# Patient Record
Sex: Male | Born: 1950 | Race: White | Hispanic: No | Marital: Married | Smoking: Never smoker
Health system: Southern US, Community
[De-identification: ages and names within clinical notes are randomized; demographics above are authoritative.]

## PROBLEM LIST (undated history)

## (undated) DIAGNOSIS — I10 Essential (primary) hypertension: Secondary | ICD-10-CM

## (undated) DIAGNOSIS — I2510 Atherosclerotic heart disease of native coronary artery without angina pectoris: Secondary | ICD-10-CM

## (undated) DIAGNOSIS — I4891 Unspecified atrial fibrillation: Secondary | ICD-10-CM

## (undated) DIAGNOSIS — E1165 Type 2 diabetes mellitus with hyperglycemia: Secondary | ICD-10-CM

## (undated) DIAGNOSIS — G4733 Obstructive sleep apnea (adult) (pediatric): Secondary | ICD-10-CM

## (undated) DIAGNOSIS — L719 Rosacea, unspecified: Secondary | ICD-10-CM

## (undated) HISTORY — DX: Atherosclerotic heart disease of native coronary artery without angina pectoris: I25.10

## (undated) HISTORY — DX: Essential (primary) hypertension: I10

## (undated) HISTORY — PX: CORONARY ARTERY BYPASS GRAFT: SHX141

## (undated) HISTORY — DX: Obstructive sleep apnea (adult) (pediatric): G47.33

## (undated) HISTORY — DX: Type 2 diabetes mellitus with hyperglycemia: E11.65

## (undated) HISTORY — DX: Rosacea, unspecified: L71.9

---

## 1998-04-10 ENCOUNTER — Inpatient Hospital Stay (HOSPITAL_COMMUNITY): Admission: EM | Admit: 1998-04-10 | Discharge: 1998-04-14 | Payer: Self-pay | Admitting: Cardiology

## 2000-03-20 ENCOUNTER — Inpatient Hospital Stay (HOSPITAL_COMMUNITY): Admission: EM | Admit: 2000-03-20 | Discharge: 2000-03-24 | Payer: Self-pay | Admitting: Cardiology

## 2000-06-06 ENCOUNTER — Inpatient Hospital Stay (HOSPITAL_COMMUNITY): Admission: EM | Admit: 2000-06-06 | Discharge: 2000-06-07 | Payer: Self-pay | Admitting: Cardiology

## 2000-06-06 ENCOUNTER — Encounter: Payer: Self-pay | Admitting: Cardiology

## 2002-04-15 ENCOUNTER — Inpatient Hospital Stay (HOSPITAL_COMMUNITY): Admission: EM | Admit: 2002-04-15 | Discharge: 2002-04-23 | Payer: Self-pay | Admitting: Cardiology

## 2002-04-16 ENCOUNTER — Encounter: Payer: Self-pay | Admitting: *Deleted

## 2002-04-18 ENCOUNTER — Encounter: Payer: Self-pay | Admitting: Thoracic Surgery (Cardiothoracic Vascular Surgery)

## 2002-04-19 ENCOUNTER — Encounter: Payer: Self-pay | Admitting: Thoracic Surgery (Cardiothoracic Vascular Surgery)

## 2002-04-20 ENCOUNTER — Encounter: Payer: Self-pay | Admitting: Thoracic Surgery (Cardiothoracic Vascular Surgery)

## 2002-04-21 ENCOUNTER — Encounter: Payer: Self-pay | Admitting: Thoracic Surgery (Cardiothoracic Vascular Surgery)

## 2006-06-19 ENCOUNTER — Ambulatory Visit: Payer: Self-pay | Admitting: Cardiology

## 2006-06-23 ENCOUNTER — Ambulatory Visit: Payer: Self-pay | Admitting: Cardiology

## 2006-07-12 ENCOUNTER — Ambulatory Visit: Payer: Self-pay | Admitting: Cardiology

## 2006-08-07 ENCOUNTER — Ambulatory Visit: Payer: Self-pay | Admitting: Cardiology

## 2006-09-06 ENCOUNTER — Ambulatory Visit: Payer: Self-pay | Admitting: Cardiology

## 2007-03-19 ENCOUNTER — Ambulatory Visit: Payer: Self-pay | Admitting: Physician Assistant

## 2007-05-29 ENCOUNTER — Ambulatory Visit (HOSPITAL_COMMUNITY): Admission: RE | Admit: 2007-05-29 | Discharge: 2007-05-29 | Payer: Self-pay | Admitting: Orthopedic Surgery

## 2007-06-25 ENCOUNTER — Ambulatory Visit: Payer: Self-pay | Admitting: Cardiology

## 2007-07-26 ENCOUNTER — Ambulatory Visit (HOSPITAL_COMMUNITY): Admission: RE | Admit: 2007-07-26 | Discharge: 2007-07-26 | Payer: Self-pay | Admitting: Orthopedic Surgery

## 2008-01-06 IMAGING — CR DG CHEST 2V
2 series · 2 of 2 positions shown · non-contrast
Comparison: None available.

CHEST - 2 VIEW
CLINICAL DATA: Meniscus tear, preoperative.

[view not recorded (1 of 2)]
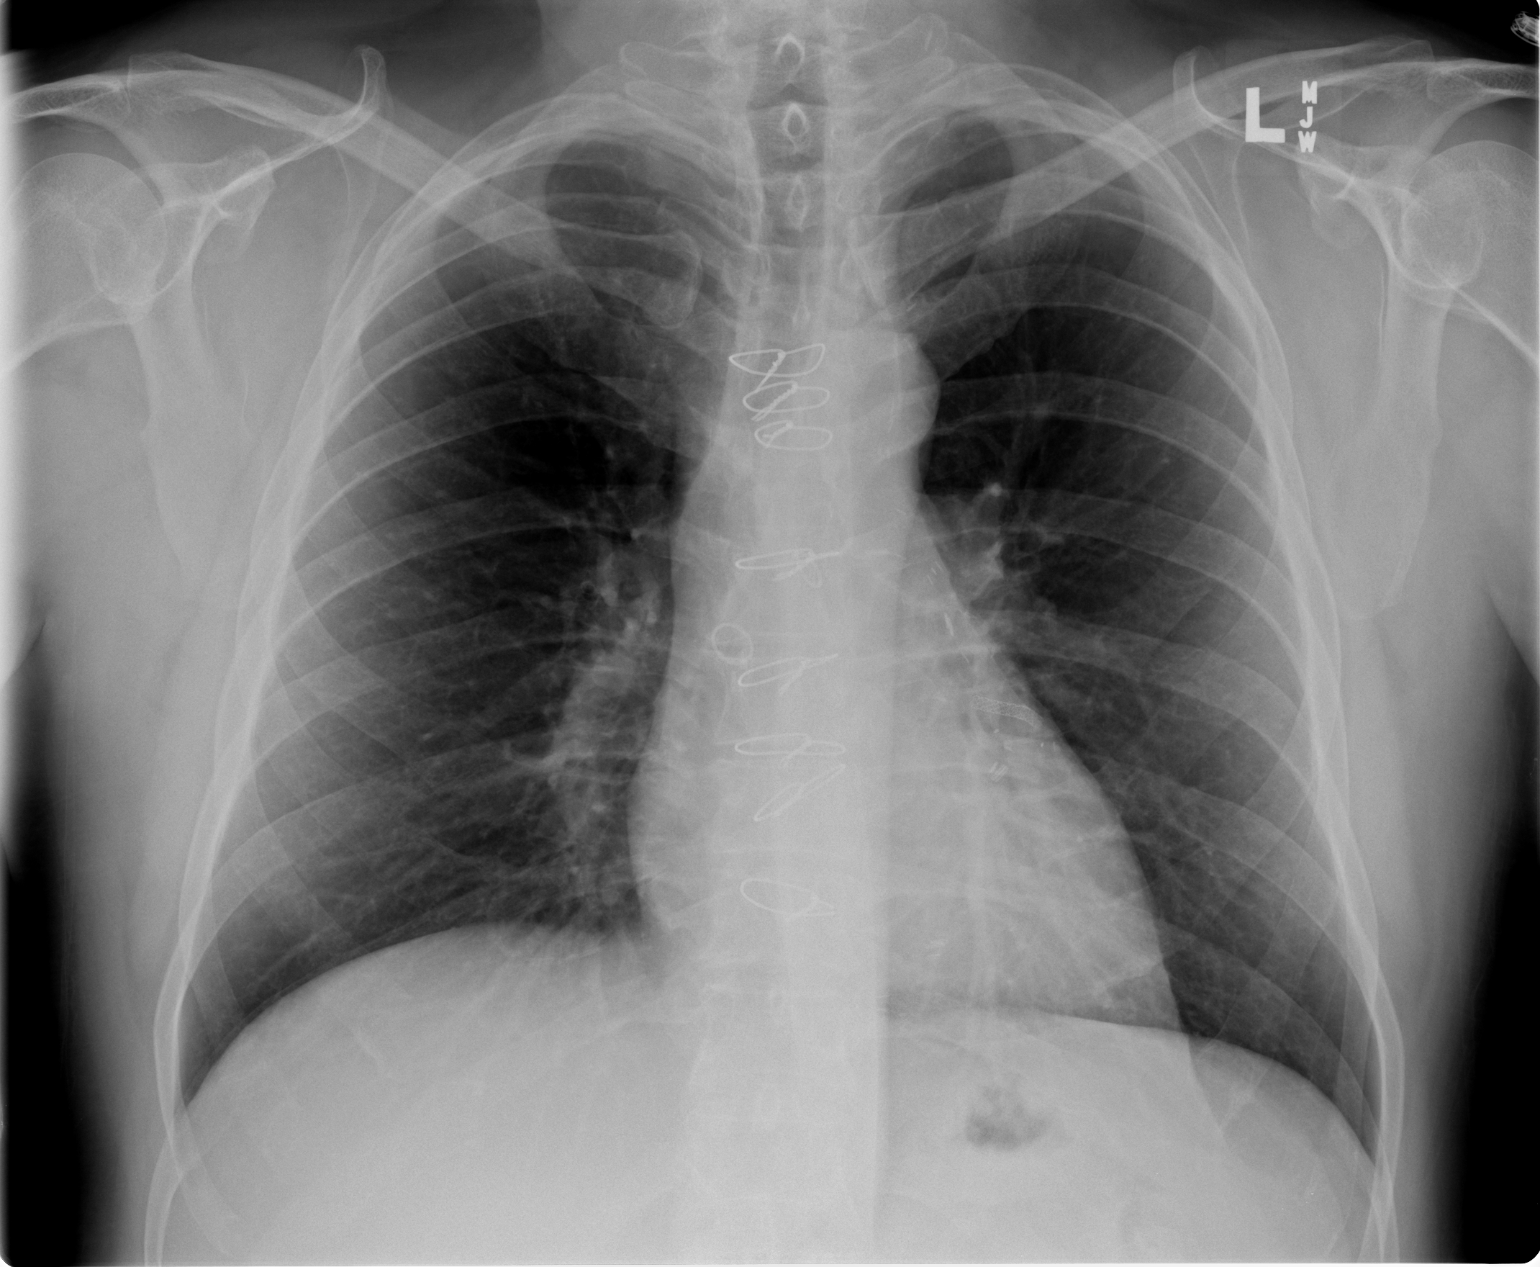

[view not recorded (2 of 2)]
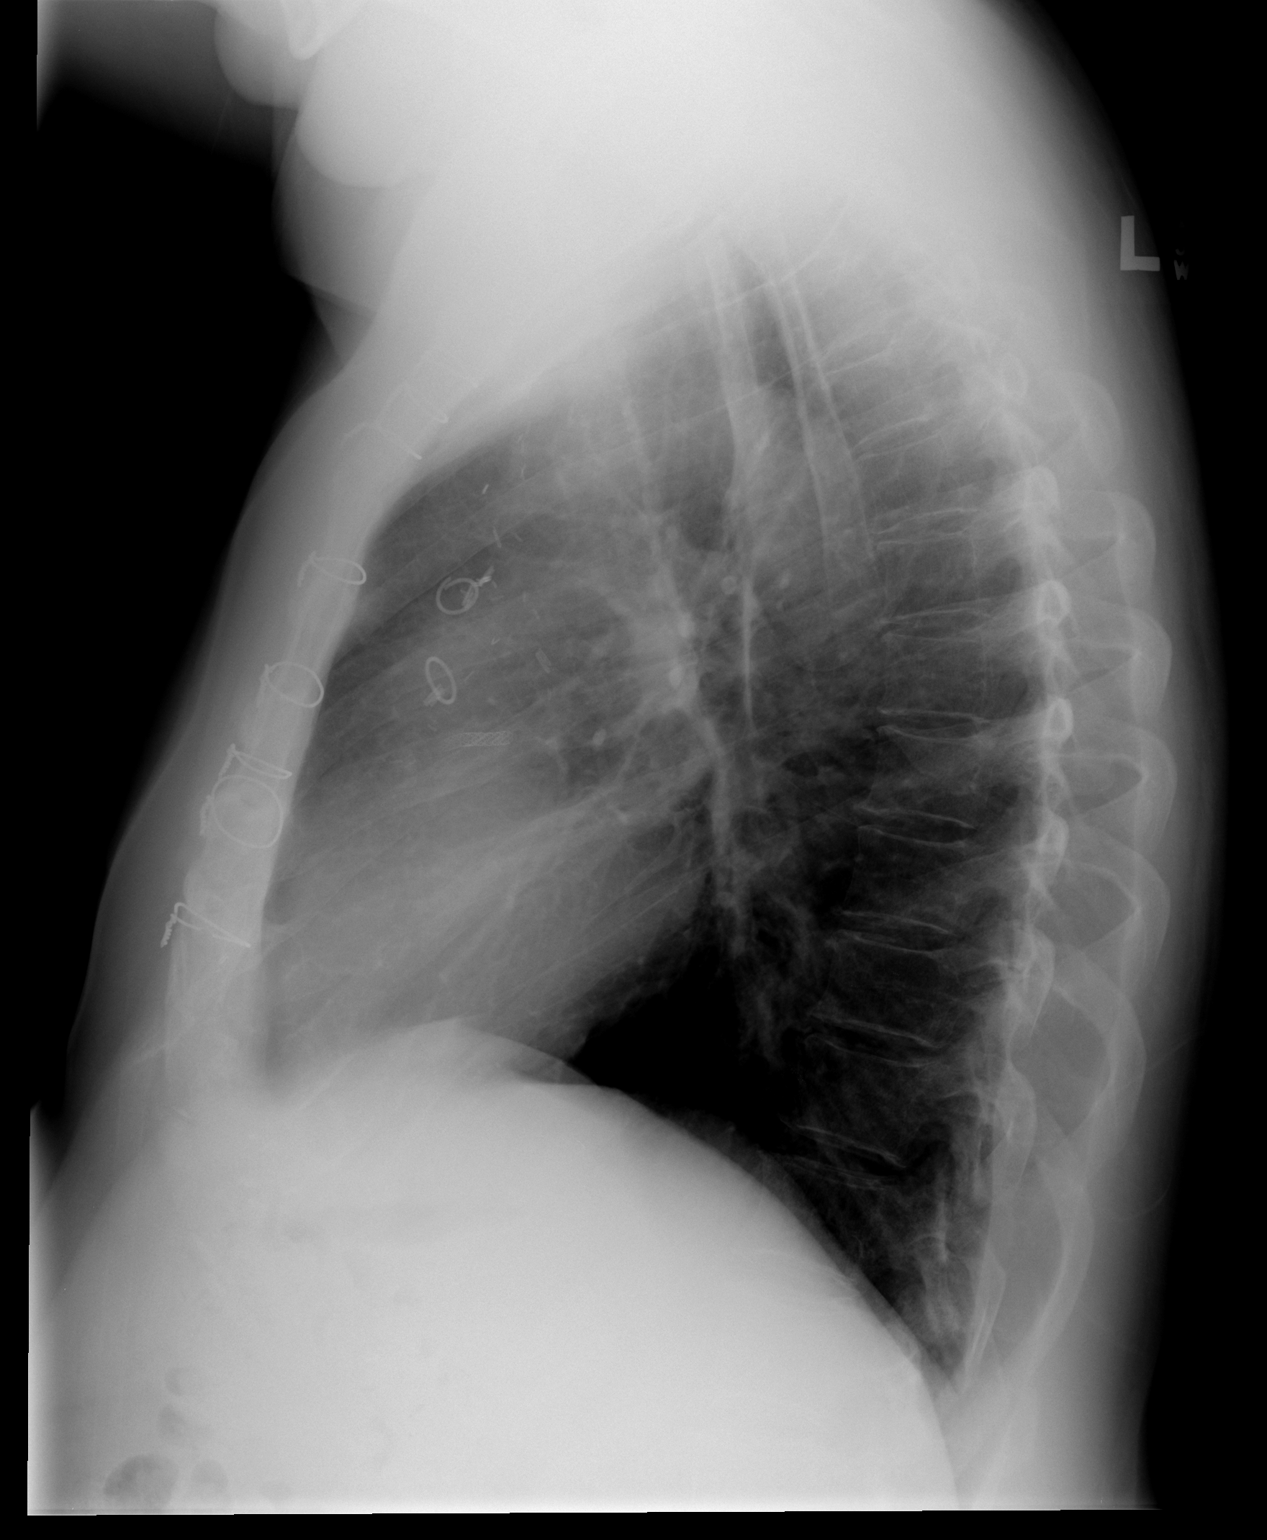

[2 of 2 positions shown; findings below may reference images not displayed]

FINDINGS: Median sternotomy/CABG, and left circumflex artery stent. Lungs
clear. No edema or effusions.

**********************************************************************
IMPRESSION
No active cardiopulmonary disease. 
**********************************************************************

## 2011-01-25 NOTE — Op Note (Signed)
NAMEJARELLE, ATES               ACCOUNT NO.:  [id number]   MEDICAL RECORD NO.:  [id number]          PATIENT TYPE:  AMB   LOCATION:  SDS                          FACILITY:  [facility]   PHYSICIAN:  Vania Rea. Supple, M.D.  DATE OF BIRTH:  [birth year]   DATE OF PROCEDURE:  07/26/2007  DATE OF DISCHARGE:  07/26/2007                               OPERATIVE REPORT   PREOPERATIVE DIAGNOSIS:  Left knee degenerative meniscal tears.   POSTOPERATIVE DIAGNOSES:  1. Left knee lateral meniscus tear.  2. Left knee chondromalacia of the patella.  3. 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   PROCEDURE:  1. Left knee diagnostic arthroscopy.  2. Partial lateral meniscectomy.  3. Chondroplasty of the patella.  4. Chondroplasty of  medial femoral condyle.   SURGEON OF RECORD:  Vania Rea. Supple, M.D.   ASSISTANTFrench Ana Shuford PA-C.   ANESTHESIA:  LMA general as well as local anesthetic.   ESTIMATED BLOOD LOSS:  Minimal.   TOURNIQUET TIME:  None was used.   DRAINS:  None.   HISTORY:  Mr. Husak is a 60 year old gentleman who has had persistent  left knee pain and mechanical symptoms which had been refractory to  attempts at conservative management.  Due to his ongoing pain and  functional limitations he is brought to the operating room at this time  for planned left knee arthroscopy described below.   Preoperatively counseled Mr. Mehlhoff on treatment options as well as  risks versus benefits thereof.  Possible surgical complications include  bleeding, infection, neurovascular injury, DVT, PE as well as persistent  pain were reviewed.  He understands and accepts and agrees with planned  procedure.   PROCEDURE IN DETAIL:  After undergoing routine preop evaluation, the  patient received prophylactic antibiotics.  Placed supine on the  operating table and underwent smooth induction of an LMA general  anesthesia.  Left leg placed in leg holder and sterilely prepped and  draped in  standard fashion.  Standard portals established and diagnostic  arthroscopy performed.  The suprapatellar pouch and gutters were clear.  There were number of small cartilaginous loose bodies that were  evacuated with a shaver.  The patellofemoral joint showed a focal area  of longitudinal chondral fissure along the lateral facet of the patella  and this was debrided to a stable cartilaginous base with a shaver.  There was otherwise normal patellar tracking.  The trochlear groove was  in excellent condition.  The intercondylar notch showed the ACL to be  intact.  Medially the meniscus was carefully inspected and probed and  found to be stable.  There was a focal area of a cartilage defect on the  medial femoral condyle with a nodule of cartilaginous tissue that  appeared to have blistered away from the underlying subchondral bone.  This area was debrided with a shaver and we did find a small full-  thickness cartilage defect approximately 5 x 5 mm.  This area was  aggressively debrided and surrounding rim of cartilage was smoothed with  a shaver.  In the lateral compartment we found a  small degenerative tear  of lateral meniscus midthird and this was trimmed back to a stable  margin with a shaver.  Articular surfaces were in otherwise good  condition in the lateral compartment.  This point final inspection and  irrigation was completed.  We did find several small chondral pieces  beneath the lateral meniscus in the posterolateral compartment.  These  were all evacuated.  We did perform a synovectomy.  In the anterior  chamber we used the Arthrex electrocoagulator to obtain meticulous  hemostasis.  At this point all fluid and instruments were removed.  A  combination of Marcaine, morphine, epinephrine and clonidine was  instilled into the joint and additional Marcaine with epi instilled  about the portals.  The portals closed with Steri-Strips.  Bulky dry  dressing taped to the left knee,  left leg wrapped with an Ace bandage,  thigh high support stocking.  The patient was then extubated and taken  to recovery room in stable condition.      Vania Rea. Supple, M.D.  Electronically Signed     KMS/MEDQ  D:  07/26/2007  T:  07/27/2007  Job:  [id number]

## 2011-01-25 NOTE — Assessment & Plan Note (Signed)
[facility]                          [facility] OFFICE NOTE   NAME:[name], [name] SR                   MRN:          [id number]  DATE:03/19/2007                            DOB:          October 08, 1950    PRIMARY CARDIOLOGIST:  Dr. Willa Rough.   PRIMARY CARE PHYSICIAN:  Dr. Selinda Flavin.   HISTORY OF PRESENT ILLNESS:  [name] is a 60 year old male patient  followed by Dr. Myrtis Ser with a history of coronary artery disease, status  post anterior wall myocardial infarction, treated with a bare metal  stent to the LAD in July of 2001, with associated ischemic  cardiomyopathy with an ejection fraction of 30% in the past, and  subsequent coronary artery bypass grafting in August of 2003, who  presents to the office today for surgical clearance. He has been seen by  Dr. Myrtis Ser recently for SVT. He had initially been seen by Gene Serpe, PA-  C and Dr. Andee Lineman back in October of 2007 for fatigue. He was diagnosed  later with sleep apnea and is now on CPAP. His fatigue is improving. In  the workup he did have a stress test done that revealed no ischemia, but  he did have rapid SVT while on the treadmill. His echocardiogram  revealed an ejection fraction of 60%. Dr. Myrtis Ser placed him on Coreg and  has up titrated the dose. The patient notes in the office today he has  not had any symptoms of chest pain or shortness of breath. Denies any  palpitations, syncope, or near syncope, orthopnea, or paroxysmal  nocturnal dyspnea, pedal edema. He is somewhat limited by his bilateral  knee pain.   CURRENT MEDICATIONS:  1. Aspirin 325 mg a day.  2. Multivitamin daily.  3. Simvastatin 20 mg at bedtime.  4. Diovan 160 mg a day.  5. Actos 45 mg at bedtime.  6. Metformin ER 500 mg 4 tablets at bedtime.  7. Coreg 12.5 mg b.i.d.  8. Lexapro 20 mg daily.   ALLERGIES:  No known drug allergies.   SOCIAL HISTORY:  The patient denies any tobacco abuse.   REVIEW OF SYSTEMS:   Please see HPI. He has bilateral knee pain with  walking. The rest of the review of systems are negative.   PHYSICAL EXAMINATION:  He is well nourished, well developed, in no  distress.  Blood pressure is 152/80, pulse 80, weight 207 pounds.  HEENT:  Normal.  NECK:  Without JVD.  LYMPH: Without lymphadenopathy.  Carotids without bruits bilaterally.  CARDIAC: Normal S1, S2. Regular rate and rhythm without murmurs.  LUNGS: Clear to auscultation bilaterally without wheezing, rhonchi, or  rales.  ABDOMEN: Soft, nontender with normoactive bowel sounds. No organomegaly.  EXTREMITIES: Without edema. Calves soft, nontender.  SKIN: Warm and dry.  NEUROLOGIC: He is alert and oriented x3. Cranial nerves II-XII grossly  intact.   Electrocardiogram was sinus rhythm with a heart rate of 62, leftward  axis, Q waves in V1-3. No significant change since previous tracing  dated June 19, 2006.   IMPRESSION:  1. Coronary artery disease.      a.  Status post anterior wall myocardial infarction July 2001,       treated with a bare metal stent to the LAD.      b.     Subsequent coronary artery bypass grafting times 16 April 2002 (limited LAD vein graft to the ramus intermedius, vein graft       to the obtuse marginal #1, and vein graft to the posterolateral       branch).  2. Ischemic cardiomyopathy with an ejection fraction of 30% in the      past.      a.     Improved to 60% by recent echocardiogram.  3. Hypertension.  4. Hyperlipidemia.  5. Diabetes mellitus.  6. Previous alcohol abuse.  7. Degenerative joint disease.      a.     Needs bilateral knee arthroscopies.  8. History of supraventricular tachycardiac in the setting of exercise      treadmill test-quiescent.  9. Obstructive sleep apnea on continuous positive airway pressure.   PLAN:  The patient presents to the office today for surgical clearance  for upcoming bilateral knee arthroscopies. He is doing well from a   cardiovascular stand point. He denies any symptoms consistent with his  previous angina. He had a recent functional study that showed no  ischemia. He also had an echocardiogram that showed improved ejection  fraction. He has been treated with beta blockers for his  supraventricular tachycardia that seems to be quiescent. He is on a good  medical regimen including aspirin, ARB, statin, and beta blocker.  According to [facility] and AHA guidelines he requires no further cardiac  workup prior to his non cardiac surgery. He should be acceptable risk.  He should remain on aspirin and beta blocker throughout the  perioperative period. I discussed the above assessment and plan with  today Dr. Diona Browner who agreed. The patient will be set up for follow up  with Dr. Myrtis Ser in the next 3 months.      Thomas Newcomer, PA-C  Electronically Signed      Thomas Sidle, MD  Electronically Signed   SW/MedQ  DD: 03/19/2007  DT: 03/19/2007  Job #: [id number]   cc:   [name]. Supple, M.D.  [name]

## 2011-01-25 NOTE — Assessment & Plan Note (Signed)
[facility]                          [facility] NOTE   NAME:[name], [name]                      MRN:          [id number]  DATE:06/25/2007                            DOB:          [birth year]    PRIMARY CARDIOLOGIST:  Dr. Willa Rough.   REASON FOR VISIT:  Scheduled clinic follow up.   Since last seen here in the clinic in July for preoperative clearance,  the patient has undergone successful right knee arthroscopic surgery, by  Dr. Rennis Chris. He is now awaiting follow up evaluation and then scheduling  for similar surgery for the contralateral knee.   From a cardiac standpoint, the patient continues to report no exertional  chest discomfort. He also has history of SVT, and cites significant  improvement since being placed on Coreg, by Dr. Myrtis Ser.   The patient has no history of tobacco smoking.   CURRENT MEDICATIONS:  1. Full dose aspirin.  2. Diovan 160 daily.  3. Actos 45 daily.  4. Metformin 2000 mg daily.  5. Coreg 12.5 b.i.d.  6. Lexapro 20 daily.   PHYSICAL EXAMINATION:  VITAL SIGNS:  Blood pressure 119/67, pulse 68 and  regular, weight 203.6.  GENERAL:  This is a 60 year old male sitting upright in no distress.  HEENT:  Normocephalic atraumatic.  NECK:  Palpable bilateral carotid pulses without bruits; no JVD.  LUNGS:  Clear to auscultation in all fields.  HEART:  Regular rate and rhythm (S1, S2), positive S4. No significant  murmurs. No rubs.  ABDOMEN:  Benign.  EXTREMITIES:  No pedal edema.  NEUROLOGIC:  No focal deficit.   IMPRESSION:  1. Coronary artery disease.      a.     Five vessel CABG, August 2003.      b.     Moderately severe LVD (EF 30%), preop; 60% by 2D echo,       October 2007.      c.     Status post acute anterior MI/bare metal stenting 100% LAD,       July 2001.      d.     Low risk Adenosine stress Cardiolite, October 2007.  2. History of SVT.      a.     Exercise induced; controlled on Coreg.  3.  Type-2 diabetes mellitus.  4. Dyslipidemia.      a.     Followed by Dr. Dimas Aguas.  5. Hypertension.  6. OSA/on CPAP.   PLAN:  1. Down titrate aspirin initially to 162 mg daily, with goal of 81 mg      daily.  2. Provide prescription for p.r.n. nitroglycerin. The patient is now      five years out since bypass surgery.  3. Aggressive lipid management, per Dr. Dimas Aguas, with LDL goal of 70 or      less.  4. Schedule return clinic follow up with myself and Dr. Myrtis Ser in six      months.      Rozell Searing, PA-C  Electronically Signed      Luis Abed, MD, [facility]  Electronically Signed   GS/MedQ  DD: 06/25/2007  DT: 06/26/2007  Job #: [id number]   cc:   Cathie Hoops. Supple, M.D.

## 2011-01-25 NOTE — Op Note (Signed)
NAMEDELVIN, HEDEEN               ACCOUNT NO.:  [id number]   MEDICAL RECORD NO.:  [id number]          PATIENT TYPE:  AMB   LOCATION:  SDS                          FACILITY:  [facility]   PHYSICIAN:  Vania Rea. Supple, M.D.  DATE OF BIRTH:  09/03/1951   DATE OF PROCEDURE:  05/29/2007  DATE OF DISCHARGE:  05/29/2007                               OPERATIVE REPORT   PREOPERATIVE DIAGNOSIS:  Right knee medial meniscus tear.   POSTOPERATIVE DIAGNOSES:  1. Right knee medial femoral condyle chondromalacia.  2. Right knee patella chondromalacia.  3. Right knee synovitis.   PROCEDURES:  1. Right knee diagnostic arthroscopy.  2. Chondroplasty of medial femoral condyle.  3. Chondroplasty of patella.  4. Extensive synovectomy.   SURGEON OF RECORD:  Vania Rea. Supple, M.D.   ANESTHESIA:  Local with IV sedation.   TOURNIQUET TIME:  None was used.   ESTIMATED BLOOD LOSS:  Minimal.   DRAINS:  None.   HISTORY:  Mr. Turrell is a 59 year old gentleman who has had persistent  right knee pain and mechanical symptoms which has been refractory to  prolonged attempts at conservative management.  Due to his ongoing pain  and function limitations and failure to respond to conservative  management, he is brought to the operating room at this time for a  planned right knee arthroscopy described below.  Preoperatively  counseled Mr. Fellers on treatment options as well as risks versus  benefits to the operation.  Possible surgical complications of bleeding,  infection, neurovascular injury, DVT, PE as well as persistence of pain  were reviewed.  He understands and accepts and agrees with our planned  procedure.   PROCEDURE IN DETAIL:  After undergoing routine preoperative evaluation,  the patient received prophylactic antibiotics.  Knee block anesthetic  was placed in the holding area by the anesthesia department, placed  supine on the operating table with the right leg placed in a leg holder  and  sterilely prepped and draped in the standard fashion.  Standard  portals were established and diagnostic arthroscopy was performed.  The  suprapatellar pouch and gutters showed diffuse synovitis emanating from  the anterior chamber with large fronds extending across the  patellofemoral joint.  An extensive synovectomy was performed.  The  patellofemoral joint showed grade 2 chondromalacia of the central  patellar facet and this area was debrided with a shaver down to a stable  cartilaginous base.  There was normal patellar tracking.  The trochlear  groove was in good condition.  Intercondylar notch at the ACL to be  intact with a negative intraoperative Lachman.  There was broad grade 3  chondromalacia on the medial femoral condyle and these areas were  debrided with the shaver down to a stable cartilaginous base and the  medial compartment of the meniscus was probed and found to be stable and  intact and the tibial plateau and majority of medial femoral condyle  were in relatively good condition with some very minimal softening of  the cartilage.  The area of chondromalacia which was debrided was  approximately 3 x 3 cm over the  most anterior margin of the medial  femoral condyle.  Laterally, the articular surfaces were in good  condition.  In addition, the meniscus was probed and found to be stable.  Some synovitis was noted again in the anterior chamber, which was  excised with a shaver.  At this point, final inspection and irrigation  was then completed.  Fluid and instruments were removed.  A combination  of Marcaine, morphine, epinephrine and clonidine was instilled into the  joint and additional Marcaine with epinephrine instilled about the  portals.  The portals closed with Steri-Strips.  A bulky dry dressing  was then wrapped about the right knee.  Right leg was wrapped with an  Ace bandage to thigh.  The patient was then transferred to the recovery  room in stable  condition.      Vania Rea. Supple, M.D.  Electronically Signed     KMS/MEDQ  D:  05/29/2007  T:  05/29/2007  Job:  [id number]

## 2011-01-28 NOTE — Assessment & Plan Note (Signed)
[facility]                            [facility]ICE NOTE   NAME:[name], [name]                   MRN:          [id number]  DATE:07/12/2006                            DOB:          Jan 25, 1951    [name] is seen for follow up. See the note on June 19, 2006 by Mr.  [name] and Dr. Andee Lineman. At that time plans were made for an echo and a  Cardiolite scan. The 2D echo shows that the patient has had good return of  function. I read the ejection fraction as potentially as high as 60% on that  study.  I did mention that there was a suggestion of hypokinesis of the  distal aspect of the anterior wall in the apex. This did not appear to be  marked by echo.  On the nuclear scan the patient exercised. He did not have  chest pain. He walked for 8 minutes on the Bruce protocol. He did have a  burst of regular supraventricular tachycardia that was narrow complex at a  rate of 190 beats per minute. It lasted for approximately 15 seconds. The  ejection fraction from that study was estimated at 45%.  There was evidence  of an old anterior scar in the distal anterior wall in the apex. The base of  the anterior wall moved but the apex was markedly hypokinetic. There was no  definite ischemia seen.  Also, labs were ordered for the patient, showing  that his hemoglobin was 15.4. BUN was 7 with a creatinine of 0.9. HDL was 45  and LDL 108 with triglycerides 277.   The patient is now back to discuss these results.   He is not having any chest pain. 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 He does admit that at times  he feels rapid heart beats. He has had no syncope or presyncope.   PAST MEDICAL HISTORY:   ALLERGIES:  No known drug allergies.   MEDICATIONS:  Aspirin, multivitamins, simvastatin, Lexapro, Diovan, Actos  and Metformin.   OTHER MEDICAL PROBLEMS:  See the complete list below.   REVIEW OF  SYSTEMS:  Other than the HPI, he feels well and his review of  systems otherwise is negative.   PHYSICAL EXAMINATION:  GENERAL: The patient is oriented to person, time and  place and his affect is normal.  VITAL SIGNS: Blood pressure is 125/80 with a pulse of 68.  LUNGS: Clear. Respiratory effort is not labored.  HEENT: Reveals no xanthelasma.  NECK: There are no carotid bruits. There is no jugular venous distension.  CARDIAC EXAM: Reveals an S1 with an S2. There are no clicks or significant  murmurs.  ABDOMEN: Soft, there is no peripheral edema.   LABS:  Are discussed in the history of present illness.   PROBLEMS INCLUDE:  1. History of some excess alcohol in the past.  2. Diabetes.  3. Hyperlipidemia.  4. Hypertension.  5. Status post CABG in 2003.  6. History of anterior myocardial infarction with ejection fraction felt  to be as low as 30% in the past.  7. Current ejection fraction somewhere between 45 and 60%.  8. Fatigue with exercise. I do not think this is ischemic at this time. He      may be having bursts of supraventricular tachycardia.  9. Documented supraventricular tachycardia during his exercise test.   I have decided to try some medicines that may help slow his heart rate and  prevent his SVT. I considered Cardizem. I have decided to use a beta blocker  and we will slowly titrate up Carvedilol. I will see him back for early  follow up.    ______________________________  Thomas Abed, MD, [facility]    JDK/MedQ  DD: 07/12/2006  DT: 07/12/2006  Job #: [id number]   cc:   [name]

## 2011-01-28 NOTE — Discharge Summary (Signed)
[place]. [facility]  Patient:    [name], [name]                        MRN: [id number] Adm. Date:  [id number] Disc. Date: 06/07/00 Attending:  Ronaldo Miyamoto Dictator:   Delton See, P.A. CC:         Luis Abed, M.D. [facility] - c/o [facility]  [address], [address], [place], [place] [address]  Selinda Flavin, M.D. - [address], [place], [place] [address]   Discharge Summary  HISTORY OF PRESENT ILLNESS:  [name] is a 60 year old male, a patient of Dr. Janett Labella who was admitted to [facility] on June 04, 2000, for an evaluation of chest pain.  The patient has a history of hypertension, elevated cholesterol, positive family history of coronary artery disease, history of elevated blood sugar, question diabetes mellitus,  history of a myocardial infarction in July 2001, with subsequent PTCA and stenting of the LAD.  PAST MEDICAL HISTORY:  Please see above.  The patient had a renal calculi in 1992.  ALLERGIES:  No known drug allergies.  CURRENT MEDICATIONS ON ADMISSION: 1. Plavix 75 mg q.d. 2. Altace 2.5 mg b.i.d. 3. Toprol XL 100 mg q.d. 4. Multivitamin q.d. 5. Coated aspirin q.d. 6. He is participating in two experimental trials, and is on medications    for these trials.  SOCIAL HISTORY:  The patient dips snuff.  He drinks three to four beers daily. He previously drank more prior to his myocardial infarction.  He does not use tobacco.  He denies the use of recreational drugs.  He works at [employer].  He is married.  HOSPITAL COURSE:  As noted, this patient was admitted to [facility] for evaluation of chest pain.  He was transferred to [facility] on June 05, 2000, for further evaluation and cardiac catheterization. Enzymes were found to be negative.  The patient underwent a cardiac catheterization on June 06, 2000, performed by Dr. Noralyn Pick. Nishan.  The left main had a 20% lesion.   The LAD had a 30% in-stent restenosis.  There is a 40% distal lesion, a 30%-40% ramus lesion.  The circumflex had a 50% ostium lesion at OM-I.  The RCA had a 30% mid to distal lesion, 20% PDA.  The left ventricle had anterior apical hypokinesis with an ejection fraction of 45%.  DISPOSITION:  Arrangements were made to discharge the patient on the following day in an improved condition, with continued medical treatment.  He was also started on Wellbutrin during this admission.  LABORATORY DATA:  An INR on June 06, 2000, was 1.0, PTT 33.  A chest x-ray showed no active disease.  There is on electrocardiogram in the chart at this time.  DISCHARGE MEDICATIONS: 1. Wellbutrin 150 mg b.i.d. 2. Coated aspirin 325 mg q.d. 3. Toprol XL 100 mg q.d. 4. Plavix 75 mg q.d. 5. Nitroglycerin p.r.n. chest pain. 6. Altace 2.5 mg b.i.d.  INSTRUCTIONS:  The patient was told to avoid any strenuous activity or driving for at least two days.  He is to be on a low-salt, low-fat diet.  He is told to call the office if he has any increased pain, swelling, or bleeding from his groin.  He was told that he could return to work on Monday, June 12, 2000.  FOLLOWUP:  He is to see Dr. Luis Abed on June 21, 2000, at 2  p.m. in [place], and to see Dr. Dimas Aguas in approximately two weeks.  DISCHARGE DIAGNOSES:  1. History of coronary artery disease with previous myocardial infarction and     percutaneous transluminal coronary angioplasty with stenting of the     left anterior descending coronary artery in July 2001.  Details not     available at this time.  2. Recent admission for chest pain with negative enzymes.  Catheterization     performed on June 06, 2000, revealing an ejection fraction of 45%,     and no significant stenosis at the previous stent site.  Please see     details as noted above.  3. History of hypertension.  4. History of elevated cholesterol levels.  5. Positive family  history of coronary artery disease.  6. Elevated blood glucose, question early diabetes mellitus.  7. Mild to moderate alcohol use.  8. 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  9. Participation in two experimental trials through [employer]. 10. History of renal calculi.DD:  06/07/00 TD:  06/07/00 Job: [id number]

## 2011-01-28 NOTE — Procedures (Signed)
[place]. [facility]  Patient:    [name], [name]                        MRN: [id number] Adm. Date:  [id number] Attending:  Ronaldo Miyamoto CC:         Selinda Flavin, M.D.  Luis Abed, M.D. [facility]   Procedure Report  INDICATIONS:  Recurrent chest pain, status post occluded mid LAD with stent in July 2001.  Left main coronary artery had a 20% discrete stenosis.  CORONARY ARTERIES: 1.The stent in the mid LAD had 30% in-stent restenosis.  The lumen was widely patent with at least a 3 mm caliber vessel.  There was 40% tubular distal disease. 2.  There is a medium sized ramus branch with 50 to 60% discrete disease. 3.  This was unchanged from the study in July 2001.  Circumflex coronary artery was normal.  The first obtuse marginal branch had a 50% ostial lesion. 4.  The right coronary artery had 30% multiple discrete lesions in the mid and distal vessel.  The PDA had 20% multiple discrete lesions.  VENTRICULOGRAPHY:  RAO ventriculography:  RAO ventriculography revealed an anterior apical wall hypokinesis.  There has been some improvement since the cath in July 2001.  Ejection fraction was in the 45 to 50% range.  Left ventricular end diastolic pressure was 12 mmHg.  There is no gradient across the aortic valve, with the aortic pressure being 111/57, and LV pressure being 115/12.  IMPRESSION:  The patient does not have critical coronary disease at this time. The ramus branch is unchanged.  The stent has only a 30% in-stent restenosis.  The films were reviewed with Dr. Gerri Spore, and we felt that medical therapy was warranted.  The patient will be started on Wellbutrin for smoking cessation.  He may also need further counseling in regards to his alcohol intake.  If his leg heals well he will be discharged in the morning. DD:  06/06/00 TD:  06/06/00 Job: [id number]

## 2011-01-28 NOTE — Op Note (Signed)
[name], [name]                           ACCOUNT NO.:  [id number]   MEDICAL RECORD NO.:  [id number]                   PATIENT TYPE:  INP   LOCATION:  2301                                 FACILITY:  [facility]   PHYSICIAN:  Salvatore Decent. Cornelius Moras, M.D.              DATE OF BIRTH:  01/07/1951   DATE OF PROCEDURE:  04/19/2002  DATE OF DISCHARGE:                                 OPERATIVE REPORT   PREOPERATIVE DIAGNOSIS:  Severe three-vessel coronary artery disease with  class IV unstable angina.   POSTOPERATIVE DIAGNOSIS:  Severe three-vessel coronary artery disease with  class IV unstable angina.   PROCEDURE:  Median sternotomy or coronary artery bypass grafting x5 (left  internal mammary artery to the distal left anterior descending coronary  artery, saphenous vein graft to ramus intermediate branch and sequential  saphenous vein graft to first circumflex marginal branch, saphenous vein  graft to posterior descending coronary artery and sequential saphenous vein  graft to right posterolateral branch).   SURGEON:  Salvatore Decent. Cornelius Moras, M.D.   ASSISTANT:  Maple Mirza, P.A.   ANESTHESIA:  General.   BRIEF CLINICAL NOTE:  The patient is a 60 year old white male from [place],  [place], followed by Selinda Flavin, M.D., and Luis Abed, M.D.,  and referred by Veneda Melter, M.D., for management of coronary artery  disease.  The patient has a history of coronary artery disease, status post  acute myocardial infarction in 7/01.  He was treated with percutaneous  coronary intervention and stent placement in the left anterior descending  coronary artery at that time.  He also has a history of hypertension,  hyperlipidemia, and type 2 diabetes mellitus.  He now presents with  recurrent symptoms of class IV unstable angina.  Cardiac catheterization  performed by Dr. Chales Abrahams demonstrates severe three-vessel coronary artery  disease with mild left ventricular dysfunction and overall ejection  fraction  estimated at 40%.  A full consultation note has been dictated previously.   OPERATIVE CONSENT:  The patient and his family have been counseled at length  regarding the indications and potential benefits of coronary artery bypass  grafting.  Alternative treatment strategies have been discussed.  They  understand and accept all associated risks of surgery, including but not  limited to risk of death, stroke, myocardial infarction, bleeding requiring  blood transfusion, arrhythmia, infection, and recurrent coronary artery  disease.  All of their questions have been addressed.   DESCRIPTION OF PROCEDURE:  The patient is brought to the operating room on  the above-mentioned date, and central monitoring is established by the  anesthesia service under the care and direction of Quita Skye. Krista Blue, M.D.  Specifically, a Swan-Ganz catheter is placed through the right internal  jugular approach.  A radial arterial line is placed.  Intravenous  antibiotics are administered.  Following induction with general endotracheal  anesthesia, a Foley catheter is placed.  The patient's  chest, abdomen, both  groins, and both lower extremities are prepared and draped in a sterile  manner.   A median sternotomy incision is performed, and the left internal mammary  artery is dissected from the chest wall and prepared for bypass grafting.  The left internal mammary artery is notably good-quality conduit.  Simultaneously saphenous vein is obtained from the patient's right lower leg  through a longitudinal incision.  The saphenous vein is notably good-quality  conduit.  The patient is heparinized systemically.   The pericardium is opened.  The ascending aorta is normal in appearance.  The ascending aorta and the right atrial appendage are cannulated for  cardiopulmonary bypass.  Adequate heparinization is verified.  Cardiopulmonary bypass is begun and the surface of the heart is inspected.  Distal sites are  selected for coronary bypass grafting.  Of note, there is a  lot of scarring in the distal anterior wall in the apex of the heart,  consistent with old anterior myocardial infarction.  Portions of saphenous  vein and the left internal mammary artery are trimmed to appropriate  lengths.  A temperature probe is placed in the left ventricular septum and a  cardioplegia catheter placed in the ascending aorta.   The patient is cooled to 32 degrees systemic temperature.  The aortic  crossclamp is applied and cardioplegia is delivered in an antegrade fashion  through the aortic root.  Iced saline slush is applied for topical  hypothermia.  The initial cardioplegic arrest and myocardial cooling are  felt to be excellent.  Repeat doses of cardioplegia are administered  intermittently throughout the crossclamp portion of the operation both  through the aortic root and down subsequently-placed vein grafts to maintain  septal temperature below 15 degrees Centigrade.  The following distal  coronary anastomoses are performed:  (1)  The posterior descending coronary  is grafted with a saphenous vein graft in a side-to-side fashion.  This  coronary measures 1.5 mm in diameter at the site of distal bypass and is of  good quality.  There is a large acutely thrombotic plaque located just  proximal to this level.  (2) The posterolateral branch off the distal right  coronary artery is grafted using the sequential saphenous vein graft off of  the vein placed to the posterior descending coronary artery.  This coronary  measures 1.8 mm in diameter and is of good quality.  (3) The ramus  intermediate branch is grafted with a saphenous vein graft in a side-to-side  fashion.  This coronary measures 1.7 mm in diameter and is of good quality.  (4) The first circumflex marginal branch is grafted with a sequential saphenous vein graft using the vein placed at the ramus intermediate branch.  This coronary measures 1.0 mm  in diameter and is of good quality at the site  of distal bypass.  (5) The distal left anterior descending coronary artery  is grafted with the left internal mammary artery in an end-to-side fashion.  This coronary measures 1.7 mm in diameter and is of fair quality.  Both  proximal saphenous vein anastomoses are performed directly to the ascending  aorta prior to removal of the aortic crossclamp.  The septal temperature is  noted to rise rapidly and dramatically upon reperfusion of the left internal  mammary artery.  All air is evacuated from the aortic root, and the patient  is placed in Trendelenburg position.  The aortic crossclamp is removed after  a total crossclamp time of 80 minutes.  The heart begins to beat spontaneously without need for cardioversion.  All  proximal and distal anastomoses are inspected for hemostasis and appropriate  graft orientation.  Epicardial pacing wires are affixed to the right  ventricular outflow tract and to the right atrial appendage.  The patient is  rewarmed to greater than 37 degrees Centigrade temperature.  The patient is  weaned from cardiopulmonary bypass without difficulty.  The patient's rhythm  at separation from bypass is normal sinus rhythm.  No inotropic support is  required.  Total cardiopulmonary bypass time for the operation is 101  minutes.   The venous and arterial cannulae are removed uneventfully.  Protamine is  administered to reverse the anticoagulation.  The mediastinum and the left  chest are irrigated in saline solution containing vancomycin.  Meticulous  surgical hemostasis is ascertained.  The mediastinum and the left chest are  drained with two chest tubes placed through separate stab incisions  inferiorly.  The median sternotomy is closed in a routine fashion.  The  right lower extremity incision is closed in multiple layers in the routine  fashion.  All skin incisions are closed with subcuticular skin closures.   The  patient tolerated the procedure well and is transported to the surgical  intensive care unit in stable condition.  There are no intraoperative  complications.  All sponge, instrument, and needle counts are verified  correct at completion of the operation.  No blood products were  administered.                                               Salvatore Decent. Cornelius Moras, M.D.    CHO/MEDQ  D:  04/19/2002  T:  04/23/2002  Job:  [id number]   cc:   Luis Abed, M.D. [facility]   Veneda Melter, M.D. [facility]   Selinda Flavin, M.D.   [facility], [place], [place]

## 2011-01-28 NOTE — Discharge Summary (Signed)
[place]. [facility]  Patient:    [name], [name]                        MRN: [id number] Adm. Date:  [id number] Disc. Date: 03/24/00 Attending:  Talitha Givens Dictator:   Lavella Hammock, P.A.C. CC:         Dr. Selinda Flavin, [place], [place]             Luis Abed, M.D. [facility]             [facility], [address] [place], [place], [place] [address]                           Discharge Summary  DATE OF BIRTH:  08/21/1951.  PROCEDURES: 1. Cardiac catheterization. 2. Coronary arteriogram. 3. Left ventriculogram. 4. PTCA x 2 and stent x 2 one vessel.  HOSPITAL COURSE:  [name] is a 60 year old male with history of nonobstructive coronary artery disease by catheterization in 1999 and a negative stress Cardiolite in April who presented with prolonged chest pain as well as anterolateral ST elevation and hyperacute P-waves.  He presented to [facility] [place] and was transferred to [facility] for further evaluation and treatment.  He was anticoagulated and started on a nitroglycerin drip and given morphine to get him pain-free.  He was taken emergently to the catheterization lab.  He had a normal left main, an LAD with proximal irregularities but it was essentially totally occluded just beyond the septal perforating vessel.  He had PTCA and stent x 2 to this area which reduced the stenosis to 0 with TIMI-3 flow. There was a fairly large ramus intermedius with 50 to 70% segmental mid plaquing.  The circumflex had a marginal branch with 50 to 60% ostial stenosis but no other disease. The RCA had 20 to 30% distal disease.  Ejection fraction was 45% with hypokinesis to akinesis of the mid and distal anterolateral apical segment. He was started on aspirin and Plavix and a beta-blocker.  He tolerated the procedure well and the sheath was removed without difficulty. He had his cholesterol checked and his total  cholesterol was 186 but triglycerides were 597 and HDL was 24, so the LDL could not be calculated.  He was seen by cardiac rehab and did well with ambulation.  He was seen by the research nurse and enrolled in the Prove-It study.  He was seen by the diabetes coordinator and enrolled in outpatient treatment as well as being given a home glucometer for use.  He did well and by March 24, 2000, was considered stable for discharge.  LABORATORY VALUES: Hemoglobin 15.7, hematocrit 43.5, wbc 7.0, platelets 218. Sodium 134, potassium 4.0, chloride 97, CO2 28, BUN 15, creatinine 0.9, glucose 222.  CK-MB peak 1622/108.  Troponin I peak 4.61.  Total cholesterol 186, triglycerides 597, HDL 24, LDL not calculated.  DISCHARGE CONDITION:  Improved.  CONSULTS:  None.  COMPLICATIONS:  None.  DISCHARGE DIAGNOSES: 1. Acute myocardial infarction. 2. Residual disease in the ramus intermedius and circumflex system with    minimal disease in the right coronary artery. 3. Mildly reduced left ventricular function with an ejection fraction of    45% by catheterization. 4. Hypertension. 5. Hyperlipidemia. 6. Ongoing tobacco use. 7. Alcohol abuse. 8. History of nephrolithiasis. 9. History of hiatal hernia/reflux disease.  DISCHARGE  INSTRUCTIONS: 1. He is to do no driving for a week, no sexual or strenuous activity until    cleared by M.D.  He may return to work when advised by M.D. 2. He is to stick to a low fat diabetic diet. 3. He is to call the office for bleeding, swelling or drainage of the    catheterization site. 4. He has an appointment with Dr. Dimas Aguas on Monday, April 03, 2000, at 9:30. 5. He is to follow up with Luis Abed, M.D., in [place], [place],    on April 07, 2000, at 11 a.m.  DISCHARGE MEDICATIONS: 1. Enteric coated aspirin 325 mg q.d. 2. Plavix 75 mg q.d. for a month. 3. Nitroglycerin 0.4 mg sublingual p.r.n. 4. Toprol XL 50 mg p.o. q.d. 5. Prove-It study drug. DD:   03/24/00 TD:  03/24/00 Job: [id number]

## 2011-01-28 NOTE — Cardiovascular Report (Signed)
NAMELABRON, BLOODGOOD                           ACCOUNT NO.:  [id number]   MEDICAL RECORD NO.:  [id number]                   PATIENT TYPE:  INP   LOCATION:  2019                                 FACILITY:  [facility]   PHYSICIAN:  Veneda Melter, M.D. [facility]               DATE OF BIRTH:  May 17, 1951   DATE OF PROCEDURE:  04/17/2002  DATE OF DISCHARGE:                              CARDIAC CATHETERIZATION   PROCEDURES PERFORMED:  1. Left heart catheterization.  2. Left ventriculogram.  3. Selective coronary angiography.  4. Abdominal aortogram.   DIAGNOSES:  1. Three-vessel coronary artery disease.  2. Moderate left ventricular systolic function.  3. Unstable angina.   HISTORY:  The patient is a 60 year old gentleman with a known history of  coronary artery disease, who had previously undergone percutaneous  intervention with stent placement to the LAD on June 21, 2000, when he  presented with acute anterior wall myocardial infarction. He has had  moderate disease in the left circumflex system and moderate LV dysfunction.  He was treated medically and has done well in the interim. However, he  represents now with recurrence of chest discomfort reminiscent of his prior  pain. The patient was admitted to the hospital with unstable angina,  stabilized medically and ruled out for acute myocardial infarction. He  presents now for further cardiac assessment.   TECHNIQUE:  Informed consent was obtained, the patient brought to the  cardiac catheterization laboratory. A 6 French sheath was placed in the  right femoral artery using the modified Seldinger technique. A 6 [place]  and JR4 catheters were then used to engage the left and right coronary  arteries and selective angiography performed in various projections using  manual injections of contrast. A 6 French pigtail catheter was then advanced  to the left ventricle and left ventriculogram performed using power  injections of contrast. The  pigtail catheter was brought back in the  descending aorta and abdominal aortogram performed using power injections of  contrast. At the termination of the case, the catheters and sheaths were  removed and manual pressure applied until adequate hemostasis was achieved.  The patient tolerated the procedure well and was transferred to the ward in  stable condition.   FINDINGS:  1. Left main trunk:  Large caliber vessel with mild irregularities.  2. LAD: This is a medium caliber vessel that provides three trivial diagonal     branches along its course. The LAD has evidence of previously placed     stents in the mid section. The vessel has mild disease of 30-40%     proximally and is 100% occluded at the origin of the stent placed near     the first diagonal branch.  The first septal perforator arises proximal     to this occlusion. The distal vessel fills via collateral flow and is     under filled. However, there appear  to be two additional trivial diagonal     branches as well as severe disease of 70% after the second diagonal     branch also in the area of previous stent placement.  3. Left circumflex artery:  This is a medium caliber vessel that provides     medium sized first marginal branch in the mid section and a small second     marginal branch distally. The AV circumflex has mild diffuse disease of     30%. The first marginal branch has a proximal narrowing of 70%.  4. Ramus intermedius: This is a medium caliber vessel. There is moderate     narrowing of 70% in the mid section.  5. Right coronary artery:  Dominant.  This is a large caliber vessel that     provides the posterior descending artery and posterior ventricular branch     in its terminal segment. The right coronary artery has mild diffuse     disease of 30% in the proximal mid section and there is a focal high-     grade narrowing of 70% in the distal RCA prior to the takeoff of the     posterior descending artery. The  posterior descending artery has hazy     subtotal narrowing of 99% in its proximal segment. The large posterior     ventricular branch has mild irregularities.   LEFT VENTRICULOGRAM:  Dilated end-systolic and end-diastolic dimensions.  Overall, left ventricular function is moderately impaired with an ejection  fraction of approximately 30%. There is akinesis of the mid and distal  anterior wall with severe hypokinesis of the mid and distal inferior wall.  No mitral regurgitation is appreciated. LV pressure is 132/10, aortic is  132/70.  LVEDP equals 16.   ABDOMINAL AORTOGRAM:  Abdominal aorta is of normal caliber with only mild  atheromatous disease. The renal arteries are single and widely patent  bilaterally. The iliac arteries have mild irregularities.   ASSESSMENT AND PLAN:  The patient is a 60 year old gentleman who presents  with advanced three-vessel coronary artery disease and moderate left  ventricular dysfunction. Due to the severity and multitude of lesions, he  will be referred for surgical revascularization.                                                Veneda Melter, M.D. [facility]    NG/MEDQ  D:  04/17/2002  T:  04/22/2002  Job:  [id number]   cc:   Luis Abed, M.D. [facility]   Selinda Flavin

## 2011-01-28 NOTE — Assessment & Plan Note (Signed)
[facility]                            [facility]   NAME:Thomas, ODAI WIMMER [name]                   MRN:          [id number]  DATE:08/07/2006                            DOB:          [birth year]    I saw [name] last on July 12, 2006. At that time, he was doing  well. However, we were following up some studies as outlined. He had had  some supraventricular tachycardia on the treadmill and I decided to add a  beta-blocker and slowly titrate it upwards. He received samples. I now  realize that I am not absolutely sure the dose we gave him, but I believe  that he was probably taking 6.25 mg of Coreg b.i.d. and today will increase  it to 12.5 mg b.i.d.   He is not having any chest pain. He has been on vacation so it is hard for  him to judge exactly his symptoms as he has had many of them at work. He has  not had any syncope or pre-syncope.   PAST MEDICAL HISTORY:   ALLERGIES:  No known drug allergies.   MEDICATIONS:  Aspirin, simvastatin, Lexapro, Diovan, Actos, metformin and  Coreg 6.25 to be increased to 12.5 today.   OTHER MEDICAL PROBLEMS:  See the complete list on my note of July 12, 2006.   REVIEW OF SYSTEMS:  Overall, he is feeling well other than some shortness of  breath with exertion.   PHYSICAL EXAMINATION:  Blood pressure is 140/82 with a pulse of 72. Weight  is 195 pounds. This is increased. The patient is oriented to person, time  and place. Affect is normal.  LUNGS:  Are clear. Respiratory effort is not labored.  CARDIAC: Reveals an S1, with an S2. There are no clicks or significant  murmurs.  He has no peripheral edema.   PROBLEMS:  Are listed on my note of July 12, 2006.   The patient had some documented supraventricular tachycardia and we will  increase his Coreg up to 12.5 b.i.d. He has had some LV dysfunction in the  past and this would be the drug of choice.   I will see him back in 4  weeks.     Luis Abed, MD, [facility]  Electronically Signed    JDK/MedQ  DD: 08/07/2006  DT: 08/07/2006  Job #: [id number]   cc:   Fara Chute

## 2011-01-28 NOTE — Assessment & Plan Note (Signed)
[facility]                            [facility]   NAME:[name], Thomas                        MRN:          [id number]  DATE:06/19/2006                            DOB:          06/24/1951    PRIMARY CARDIOLOGIST:  Luis Abed, M.D.   REASON FOR VISIT:  [name] is a 60 year old male, with known coronary  artery disease, status post prior myocardial infarction and subsequent  bypass surgery in August 2003, who has not been seen here in the clinic  since sometime shortly after his bypass surgery.   The patient now presents as a self-referral for evaluation of worsening  fatigue and exertional dyspnea over the past year; however, he denies any  exertional chest discomfort.  He states that he has not had any chest pain  reminiscent of his initial myocardial infarction in 2001.   The patient is able to climb a flight of stairs, for example, but has  significant fatigue and dyspnea upon completion.  There is no associated  chest discomfort.   The patient also reports feeling very tired throughout the whole day and has  frequent naps.  His wife has noted that he wakes up in the middle of the  night, multiple times, and appears to have stopped breathing.  He has no  formal history of sleep apnea.   Electrocardiogram today reveals normal sinus rhythm at 60 BPM with left axis  deviation and nonspecific ST abnormalities.   CURRENT MEDICATIONS:  1. Coated aspirin 325 daily.  2. Simvastatin 20 daily.  3. Lexapro 10 daily.  4. Diovan 160 daily.  5. Actos 45 daily.  6. Metformin 2000 mg q.h.s.   PHYSICAL EXAMINATION:  VITAL SIGNS:  Blood pressure 140/90, bilaterally,  heart rate 60, regular, weight 193.  GENERAL:  A 60 year old male, moderately obese, in no apparent distress.  HEENT:  Normocephalic, atraumatic.  NECK:  Palpable bilateral carotid pulses without bruits.  LUNGS:  Clear to auscultation throughout.  HEART:   Regular rate and rhythm (S1/S2).  Positive S4.  No significant  murmurs.  ABDOMEN:  Soft, nontender, with intact bowel sounds.  No hepatomegaly.  No  bruits.  EXTREMITIES:  Palpable bilateral femoral pulses without bruits; palpable  posterior tibialis pulses with trace pedal edema.  NEUROLOGIC:  No focal deficit.   IMPRESSION:  1. Significant exertional fatigue/dyspnea.      a.     Question anginal equivalent.      b.     Question obstructive sleep apnea.  2. Multivessel coronary artery disease.      a.     Five vessel CABG, August 2003:  LIMA - LAD; SVG - ramus       intermedius; SVG - OM1; SVG - PO branch.      b.     Associated moderate LVG (EF 30%) with anterior wall       akinesis/severe inferior wall hypokinesis.      c.     Status post acute anterior myocardial infarction/bare metal       stenting, 100% occluded LAD, July  2001.  3. Hypertension.  4. Hyperlipidemia.  5. Type 2 diabetes mellitus.  6. Alcohol abuse.   PLAN:  1. 2-D echocardiogram for reassessment of left ventricular function.  2. Exercise stress Cardiolite to exclude significant underlying ischemia.  3. Overnight pulse oximetry with [facility] for initial assessment of      possible obstructive sleep apnea.  4. Complete blood work with a complete metabolic panel, CBC, and TSH.  5. Fasting lipid profile.  6. Schedule clinic follow-up with myself, and Dr. Willa Rough in one      month, for review of test results and further recommendations.      ______________________________  Rozell Searing, PA-C    ______________________________  Learta Codding, MD,FACC    GS/MedQ  DD:  06/19/2006  DT:  06/21/2006  Job #:  [id number]   cc:   Selinda Flavin

## 2011-01-28 NOTE — Consult Note (Signed)
[name], [name]                           ACCOUNT NO.:  [id number]   MEDICAL RECORD NO.:  [id number]                   PATIENT TYPE:  INP   LOCATION:  2019                                 FACILITY:  [facility]   PHYSICIAN:  Salvatore Decent. Cornelius Moras, M.D.              DATE OF BIRTH:  1950-11-24   DATE OF CONSULTATION:  04/17/2002  DATE OF DISCHARGE:                       CARDIOVASCULAR/THORACIC CONSULTATION   REASON FOR CONSULTATION:  Severe three-vessel coronary artery disease with  class IV unstable angina.   HISTORY OF PRESENT ILLNESS:  Mr. 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  He has known history of coronary artery disease dating back to  July 2001 at which time he suffered an acute anterior myocardial infarction.  He was treated with pecutaneous coronary intervention with stenting of the  left anterior descending coronary artery by Dr. Bonnee Quin.  The patient  did well initially following this time.  He notes that recently he has had  occasional episodes of palpitations and mild chest pressure.   On August 4, he developed sudden onset of severe substernal chest pain  similar to that which occurred at the time of his previous myocardial  infarction.  He presented to the emergency room at [facility] where  his pain was relieved with nitroglycerin.  He was admitted to the hospital  and ruled out for acute myocardial infarction.  He has had recurrent  episodes of chest pain yesterday prompting transfer to [facility] where he  has now been stabilized with intravenous heparin and nitroglycerin.  He  underwent cardiac catheterization today by Dr. Chales Abrahams.  This demonstrates  severe three-vessel coronary artery disease with mild to moderate left  ventricular dysfunction.  Cardiac surgical consultation is requested.   REVIEW OF SYSTEMS:  GENERAL:  The patient reports otherwise feeling well.  He has  not been gaining or losing weight recently, and he has a good  appetite.  RESPIRATIONS:  Negative.  The patient denies any chronic cough or  productive cough or history of hemoptysis.  GASTROINTESTINAL: Notable for  some occasional problems with symptoms of GE reflux and vomiting, although  in general the patient denies any problems with swallowing, odynophagia,  hemochezia, hematemesis, or melena.  He denies any problems with  constipation or diarrhea.  NEUROLOGIC:  Negative.  MUSCULOSKELETAL:  Negative.  GENITOURINARY:  Negative.  INFECTIOUS:  Negative.  HEMOLOGIC:  Negative.  ENDOCRINE:  Notable for type diabetes mellitus.  The patient does  not check his own blood sugars.  PSYCHIATRIC:  Negative.  HEENT: Negative.  The patient wears glasses for reading.   PAST MEDICAL HISTORY:  Notable for coronary artery disease with previous  myocardial infarction as noted previously.  The patient also has history of  hypertension, hyperlipidemia, type 2 diabetes mellitus which was diagnosed  at the time of his previous heart attack.  The patient has a strong family  history of coronary artery disease.  The patient denies any known history of  previous stroke, peripheral vascular disease, or renal insufficiency.  The  patient has a history of kidney stones.  He has not had any previous  surgical operations.   SOCIAL HISTORY:  The patient lives with his wife in [place] and works full time  as a Naval architect for [employer].  He denies any history of cigarette  smoking, although he has a longstanding history of oral tobacco use.  The  patient reports heavy alcohol consumption, drinking at least six to eight  beers each day and sometimes considerably more than that.   MEDICATIONS PRIOR TO ADMISSION:  Aspirin, Toprol XL, Glucophage, and Zocor.   ALLERGIES:  The patient denies any known drug allergies or sensitivities.   PHYSICAL EXAMINATION:  GENERAL:  Well-appearing male who appears his stated  age  in no acute distress.  VITAL SIGNS:  Blood pressure 108/56, pulse 70 and regular.  He is afebrile.  HEENT:  Essentially within normal limits.  NECK:  Supple.  There is no cervical or supraclavicular lymphadenopathy.  There is no jugular venous distention.  No carotid bruits are noted.  CHEST:  Auscultation reveals clear and symmetrical breath sounds  bilaterally.  No wheezes or rhonchi are demonstrated.  CARDIOVASCULAR:  Exam notable for regular rate and rhythm.  No murmur, rub,  or gallop noted.  ABDOMEN:  Soft and nontender.  There are no palpable masses.  The liver edge  is not enlarged.  Bowel sounds are present.  EXTREMITIES:  Warm and well perfused.  There is no lower extremity edema.  Distal pulses are easily palpable in both legs at the ankle.  There is no  venous insufficiency.  RECTAL:  Deferred.  GU:  Deferred.  NEUROLOGIC:  Grossly nonfocal and symmetrical throughout.  SKIN:  Clean, dry, and healthy throughout with the exception of a small area  of broken skin on the plantar surface of his left foot at the base of his  great toe.   DIAGNOSTIC TESTS:  Cardiac catheterization performed today by Dr. Chales Abrahams is  reviewed.  This demonstrates severe three-vessel coronary artery disease  including 100% occlusion of the left anterior descending coronary artery at  the site of previous stent placement.  The left circumflex coronary artery  gives rise to a two circumflex marginal branches with 70 to 80% ostial  stenosis of the large first circumflex marginal branch.  There is a large  ramos intermediate branch with 70 to 80% stenosis.  The right coronary  artery is dominant and gives rise to a large posterior descending coronary  artery and a single large right posterolateral branch. There is 70% stenosis  of the distal right coronary artery with 99% proximal stenosis of the  posterior descending coronary artery.  There is moderate left ventricular dysfunction with ejection fraction  estimated at 40%.  There is severe  anteroapical hypokinesis or akinesis and moderate inferoapical hypokinesis.  There is no mitral regurgitation.   IMPRESSION:  Severe three-vessel coronary artery disease with moderate left  ventricular dysfunction and class IV unstable angina.  I believe that Mr.  Kassebaum would be best treated with coronary artery bypass grafting.   PLAN:  We tentatively plan to proceed with surgery first case Friday, August  8.  I have outlined at length the indications and potential benefits of  surgery with Mr. Clowers and his family.  Alternative treatment strategies  have been discussed.  All of their questions have been addressed.  They  understand and accept all associated risks of surgery including but not  limited to risk of death, stroke, myocardial infarction, respiratory  failure, bleeding requiring blood transfusion, arrhythmia, infection, and  recurrent coronary artery disease.  They understand the  possibility for signs of alcohol withdrawal or development of confusion or  delirium following surgery.  They also understand the somewhat increased  risk of bleeding complications or need for transfusion of blood products due  to the patient's administration of Plavix during this hospital admission.  All of their questions have been addressed.                                                 Salvatore Decent. Cornelius Moras, M.D.    CHO/MEDQ  D:  04/17/2002  T:  04/22/2002  Job:  [id number]   cc:   Veneda Melter, M.D. [facility]   Luis Abed, M.D. [facility]   Selinda Flavin   [facility],  [place]

## 2011-01-28 NOTE — H&P (Signed)
NAMELEJUAN, BOTTO                           ACCOUNT NO.:  [id number]   MEDICAL RECORD NO.:  [id number]                   PATIENT TYPE:  INP   LOCATION:  2019                                 FACILITY:  [facility]   PHYSICIAN:  Jesse Sans. Wall, M.D. [facility]            DATE OF BIRTH:  [birth year]   DATE OF ADMISSION:  04/15/2002  DATE OF DISCHARGE:                                HISTORY & PHYSICAL   CHIEF COMPLAINT:  Recurrent chest pain.   HISTORY OF PRESENT ILLNESS:  The patient is a 60 year-old white male with a  history of type 2 diabetes for two years, hyperlipidemia and snuff dipper  who has known coronary artery disease.  He had an acute LAD infarct on March 21, 2000 treated with primary coronary intervention with a stent by Dr. Bonnee Quin.  Since that time he has done well.  He awoke this afternoon from a nap around 3 p.m. with substernal chest pain  similar to what he had before in 2001.  He came to the [facility] Emergency  Room and was given nitroglycerin times two and aspirin times three with  relief.  Apparently, he dropped his blood pressure somewhat in the [facility]  emergency room.  He has been pain free since.  His catheterization in July 2001 showed a totally occluded LAD that was  stented, a 70% in a ramus intermedius, a 70% in an obtuse marginal one and a  20 to 30% in the right coronary artery.  He had an ejection fraction of 45%  with akinesia of the anterior apical and inferoapical wall.  Laboratory data at [facility] was unremarkable.  EKG showed no acute changes.   PAST MEDICAL HISTORY:  1. Renal stones in 1992.  2. Diabetes for two years.  3. Well controlled hypertension.  4. Hyperlipidemia of the mixed variety.   FAMILY HISTORY:  His mother died of cancer but also had coronary artery  bypass grafting.  His father also had coronary artery disease.  He has a  brother who has had a MI at age 74.   ALLERGIES:  He has no known drug allergies.   CURRENT MEDICATIONS:  1.  Toprol XL 100 mg q.d.  2. Multivitamin.  3. Aspirin 325 mg.  4. Zocor 20 mg q.h.s.  5. Glucophage 500 mg a day.   SOCIAL HISTORY:  He works at [employer]. He drinks about six to  eight beers a day.  He does not smoke but dips snuff.   REVIEW OF SYMPTOMS:  Noncontributory other than in the history of present  illness.   PHYSICAL EXAMINATION:  GENERAL:  He is a well developed, well nourished,  plethoric white male in no acute distress.  VITAL SIGNS:  Blood pressure is 130,80, normal sinus rhythm at a rate of 64,  respiratory rate is 20, 02 saturations are 99% on room air and he is  afebrile.  HEENT EXAM:  Pupils equal, round and responsive to light and accommodation.  Extraocular movements are intact.  He has facial asymmetry. His neck shows  no JVD with good carotid upstrokes without bruit.  There is no thyromegaly.  Trachea is midline.  NEUROLOGIC EXAM:  Grossly intact.  LUNGS:  Clear to auscultation and percussion.  HEART:  Regular rate and rhythm without a murmur, rub or gallop.  ABDOMINAL EXAM:  Soft with good bowel sounds. There is no  hepatosplenomegaly. There is no tenderness.  EXTREMITIES:  No edema.  He has good distal pulses.  When I arrived to evaluate him after he had been seen by __________, TAC, he  was beginning to have some recurrent chest pain.  EKG was obtained which  showed some mild ST segment depression in 1 and AVL, slightly more prominent  than baseline.  As we gave him a bolus of heparin and started IV  nitroglycerin he is becoming pain free.  We will observe him closely until  he is pain free.   IMPRESSION:  1. Atherosclerotic coronary artery disease; a)  Status post anterior apical     and inferior wall MI from an acute LAD infarct on March 21, 2000 status     post primary coronary intervention with stenting; b) Acute coronary     syndrome.  2. Mixed hyperlipidemia.  3. Type 2 diabetes.  4. Hypertension.  5. Increased alcohol use.  6.  Nicotine use.   PLAN:  1. IV nitroglycerin and heparin.  2. Aspirin.  3. Beta blockade.  4. Begin Integrelin if his pain is not relieved.  5. Cardiac catheterization tomorrow.  The indications, potential risks and     benefits have been discussed with the patient and he agrees to proceed.                                               Thomas C. Daleen Squibb, M.D. [facility]    TCW/MEDQ  D:  04/15/2002  T:  04/19/2002  Job:  [id number]   cc:   Selinda Flavin   [facility]  [place]  [place]

## 2011-01-28 NOTE — Discharge Summary (Signed)
NAMEYANIXAN, MELLINGER                           ACCOUNT NO.:  [id number]   MEDICAL RECORD NO.:  [id number]                   PATIENT TYPE:  INP   LOCATION:  2041                                 FACILITY:  [facility]   PHYSICIAN:  Tressie Stalker, M.D.                 DATE OF BIRTH:  12-07-1950   DATE OF ADMISSION:  04/15/2002  DATE OF DISCHARGE:  04/23/2002                                 DISCHARGE SUMMARY   ADMISSION DIAGNOSIS:  Coronary artery disease.   SECONDARY DIAGNOSES:  1. Non-insulin-dependent diabetes mellitus.  2. Postoperative anemia secondary to blood loss.   DISCHARGE DIAGNOSIS:  Coronary artery disease.   HOSPITAL COURSE:  The patient was admitted to [facility] on April 15, 2002, secondary to experiencing chest pain.  Because  of this, he underwent cardiovascular work-up including cardiac  catheterization which revealed he had significant coronary artery disease.  Because of this, Dr. Cornelius Moras was consulted.  On April 19, 2002, the patient  underwent a coronary artery bypass graft x5 with left internal mammary  artery anastomosed to the left anterior descending coronary artery,  sequential saphenous vein graft to posterior descending and posterior  lateral arteries and a sequential saphenous vein graft to the right  intermediate and obtuse marginal 2, ramus intermedius and second obtuse  marginal artery.  No complications were noted during the procedure.   Postoperatively, the patient had a relatively uneventful hospital course.  He had mild postoperative anemia secondary to blood loss, which he tolerated  well.  He had adequate CBG control with Glucophage and sliding scale  insulin.  Hemoglobin and hematocrit were stable at 10 and 30.  BUN and  creatinine were 9 and 0.8.  The patient had a relatively uneventful hospital  course.  He was subsequently deemed stable for discharge home on April 23, 2002.   DISCHARGE MEDICATIONS:  1. Aspirin 325 mg one  daily.  2. Lopressor 50 mg one half tablet every 12 hours.  3. Zocor 20 mg one daily.  4. Glucophage 500 mg one daily.  5. Lasix 40 mg one daily for seven days.  6. Potassium chloride 20 mEq one daily for seven days.  7. Tylox, 1-2 tablets, take 4-6 hours as needed for pain.   ACTIVITY:  The patient was told no driving, strenuous activity or lifting  heavy objects.  Told to walk daily and continue breathing exercises.   DIET:  An 1800 calorie ADA diet.   WOUND CARE:  The patient was told he could shower and clean his incisions  with soap and water.   DISPOSITION:  The patient was discharged home.   FOLLOW UP:  The patient was told to call his cardiologist, Dr. Luisa Hart  __________ , for followup appointment.  He was told to see Dr. Cornelius Moras at the  [facility] office on Monday, May 20, 2002, at 10:15 a.m.  He  was told to  bring his chest x-ray with him.       Levin Erp. Steward, P.A.                      Tressie Stalker, M.D.    BGS/MEDQ  D:  04/22/2002  T:  04/26/2002  Job:  [id number]   cc:   [facility]   Luis Abed, M.D. [facility]

## 2011-01-28 NOTE — Cardiovascular Report (Signed)
[place]  Patient:    [name], [name]                        MRN: [id number] Adm. Date:  [id number] Attending:  Talitha Givens CC:         Luis Abed, M.D. [facility]             Cardiovascular Laboratory                        Cardiac Catheterization  INDICATIONS FOR PROCEDURE:  The patient is a pleasant 60 year old who presented earlier today to [facility] Emergency Room with ch4est discomfort. Apparently, it was thought that the patient initially had discomfort secondary to gastroesophageal causes, and he was subsequently discharged.  He returned later with chest pain and evidence of ST elevation and was transferred to [facility].  He was seen by Dr. Donnamarie Rossetti and referred to the cardiac catheterization laboratory for emergency evaluation.  PROCEDURES: 1. Left heart catheterization. 2. Selective coronary arteriography. 3. Selective left ventriculography. 4. Percutaneous transluminal coronary angioplasty and stenting of the left    anterior descending artery.  DESCRIPTION OF PROCEDURE:  The patient was brought to the catheterization lab, prepped and draped in the usual fashion.  Xylocaine, 1%, was used for local anesthesia.  Through an anterior puncture, the right femoral artery was easily entered.  A 7 French sheath was placed.  Views of the left and right coronary arteries were obtained in multiple angiographic projections.  Ventriculography was performed in the RAO projection.  The patient experienced nausea with Hexabrix and substantial vomiting, and we subsequently discontinued this and used Omnipaque.  Heparin and Integrilin were given according to protocol. The patient had an occluded left anterior descending artery.  A JL4 guiding catheter was then utilized after appropriate anticoagulation.  A 0.014 Hi-Torque Floppy wire was passed down the artery and into the distal vessel. the lesion was initially dilated using a 2.5 mm  Quantum Ranger balloon.  The lesion was then stented using an 18 mm NIR stent.  This was carefully placed just beyond the septal perforating vessel.  There remained a small area of luminal flap distal to the stented site, and so a 3.0 mm, 9 mm length AVE S7 stent was passed through the previously placed stent to the distal end of the stent, where these were overlapped.  With the overlapping stents, there was complete resolution of any type of luminal irregularity or flap.  TIMI 3 flow was established.  There was a smooth angiographic result.  The procedure was completed.  The patient was taken to his room in satisfactory clinical condition.  HEMODYNAMIC DATA:  The initial central aortic pressure was 123/76.  LV pressure was 136/11.  There was no gradient on pull back across the aortic valve.  ANGIOGRAPHIC DATA:  The left main coronary artery was large caliber, being in excess of 5 mm and free of critical disease.  The left anterior descending artery demonstrates some irregularity proximally, and essentially is totally occluded just beyond the septal perforating vessel. Following PTCA and stenting, this area of irregularity is reduced to 0% residual luminal narrowing with an excellent angiographic appearance.  There remained some very minor luminal irregularity just beyond the septal perforating vessel, but this area was covered well by the stent.  The vessel beyond this area after the first stent had a residual flap, but this was covered nicely  by the AVE stent.  Distally, there was some mild luminal irregularity involving both the LAD and diagonal branch, but no critical stenoses.  There was a fairly large ramus intermedius with about a 50% to 70% area of segmental mid plaquing.  This was not felt to be critical.  The circumflex provides a marginal branch and then terminates as a small posterolateral system.  The marginal branch itself has some proximal 50% to 70% ostial proximally  stenosis.  Critical disease, however, was not noted.  The right coronary artery demonstrates some ectasia proximally and about a 20% to 30% hypodense area in the distal vessel prior to the PDA take off.  The PDA and posterolateral branch are widely patent with some mild luminal irregularities.  The ventriculogram demonstrates hypokinesis to akinesis of the mid and distal anterolateral apical segment.  Ejection fraction would be estimated at 45%.  CONCLUSIONS: 1. Mild/moderate reduction of global left ventricular function with an    anterior wall motion abnormality. 2. Total occlusion of the left anterior descending artery with successful    percutaneous transluminal coronary angioplasty and stenting of the LAD as    described in the above text. 3. Other scattered irregularities as noted above.  DISPOSITION:  The patient will be treated with aspirin and Plavix.  The patient will also be treated with 24 hours of Integrilin.  He will eventually need aggressive risk factor reduction. DD:  03/21/00 TD:  03/21/00 Job: [id number]

## 2011-01-28 NOTE — Assessment & Plan Note (Signed)
[facility]                          [facility] NOTE   NAME:Libbey, ATA PECHA SR                   MRN:          [id number]  DATE:09/06/2006                            DOB:          [birth year]    HISTORY:  Mr. Pelzel was seen for cardiology followup.  His  palpitations are decreased.  He is tolerating 12.5 mg of Coreg b.i.d.  and he is doing well.   The patient had been assessed for pulse oximetry at night, and he had  significant desaturation and a sleep study was arranged.  It was read by  Dr. Ninetta Lights.  The study shows moderate to severe obstructive sleep apnea  and the patient is a good candidate for CPAP.  The patient is returning  for further treatment soon at the sleep laboratory.   ALLERGIES:  No known drug allergies.   MEDICATIONS:  1. Aspirin.  2. Simvastatin.  3. Lexapro.  4. Diovan.  5. Actos.  6. Metformin.  7. Coreg 12.5 mg b.i.d.   PAST MEDICAL HISTORY:  See the list on my note of July 12, 2006.   REVIEW OF SYSTEMS:  Overall he is doing well, other than when he is over-  worked.  Otherwise the review of systems is negative.   PHYSICAL EXAMINATION:  VITAL SIGNS:  Blood pressure 140/82, pulse 72.  GENERAL:  The patient is oriented to person, time and place.  Affect is  normal.  HEENT:  Eyes:  Reveal no xanthelasma.  He has a persistent redness to  his facial tissue that I have seen before.  This is chronic.  NECK:  There are no carotid bruits.  There is no jugular venous  distention.  LUNGS:  Clear.  HEART:  An S1 and S2.  There are no clicks or significant murmurs.  ABDOMEN:  Soft, no masses or bruits.  EXTREMITIES:  He has no peripheral edema.   PROBLEM/PLAN:  1 through 8:  Listed on my note of July 12, 2006.  1. Supraventricular tachycardia during exercise test:  He is doing      well with Coreg, and we will      continue this for now.  I have decided not to titrate his dose any      further at this  point.  2. Significant sleep apnea:  He is looking forward to his starting of      CPAP.     Luis Abed, MD, [facility]  Electronically Signed    JDK/MedQ  DD: 09/06/2006  DT: 09/06/2006  Job #: [id number]   cc:   Fara Chute

## 2011-06-21 LAB — BASIC METABOLIC PANEL
BUN: 11
CO2: 28
Calcium: 9.2
Chloride: 100
Creatinine, Ser: 0.84
GFR calc Af Amer: >60
GFR calc non Af Amer: >60
Glucose, Bld: 260 — ABNORMAL HIGH
Potassium: 4
Sodium: 135

## 2011-06-21 LAB — CBC
HCT: 40
Hemoglobin: 13.8
MCV: 95.5
WBC: 6

## 2011-06-23 LAB — CBC
Hemoglobin: 14.5
RBC: 4.42
WBC: 5.7

## 2011-06-23 LAB — DIFFERENTIAL
Basophils Relative: 0
Eosinophils Absolute: 0.1
Monocytes Relative: 8
Neutrophils Relative %: 62

## 2011-06-23 LAB — COMPREHENSIVE METABOLIC PANEL
ALT: 40
Alkaline Phosphatase: 69
CO2: 28
Chloride: 102
GFR calc non Af Amer: >60
Glucose, Bld: 208 — ABNORMAL HIGH
Potassium: 5
Sodium: 139
Total Protein: 7.2

## 2011-06-23 LAB — URINALYSIS, ROUTINE W REFLEX MICROSCOPIC
Nitrite: NEGATIVE
Specific Gravity, Urine: 1.004 — ABNORMAL LOW
pH: 7

## 2011-06-23 LAB — APTT: aPTT: 32

## 2014-04-30 DIAGNOSIS — G4733 Obstructive sleep apnea (adult) (pediatric): Secondary | ICD-10-CM | POA: Insufficient documentation

## 2016-05-26 DIAGNOSIS — L719 Rosacea, unspecified: Secondary | ICD-10-CM | POA: Insufficient documentation

## 2016-10-21 DIAGNOSIS — I10 Essential (primary) hypertension: Secondary | ICD-10-CM | POA: Diagnosis not present

## 2016-10-21 DIAGNOSIS — E1165 Type 2 diabetes mellitus with hyperglycemia: Secondary | ICD-10-CM | POA: Diagnosis not present

## 2016-10-21 DIAGNOSIS — E7800 Pure hypercholesterolemia, unspecified: Secondary | ICD-10-CM | POA: Diagnosis not present

## 2016-10-28 DIAGNOSIS — E1165 Type 2 diabetes mellitus with hyperglycemia: Secondary | ICD-10-CM | POA: Diagnosis not present

## 2016-10-28 DIAGNOSIS — E7800 Pure hypercholesterolemia, unspecified: Secondary | ICD-10-CM | POA: Diagnosis not present

## 2016-10-28 DIAGNOSIS — G4733 Obstructive sleep apnea (adult) (pediatric): Secondary | ICD-10-CM | POA: Diagnosis not present

## 2016-10-28 DIAGNOSIS — Z6827 Body mass index (BMI) 27.0-27.9, adult: Secondary | ICD-10-CM | POA: Diagnosis not present

## 2016-10-28 DIAGNOSIS — I10 Essential (primary) hypertension: Secondary | ICD-10-CM | POA: Diagnosis not present

## 2016-11-18 DIAGNOSIS — M9903 Segmental and somatic dysfunction of lumbar region: Secondary | ICD-10-CM | POA: Diagnosis not present

## 2016-11-18 DIAGNOSIS — M5442 Lumbago with sciatica, left side: Secondary | ICD-10-CM | POA: Diagnosis not present

## 2016-11-18 DIAGNOSIS — M9902 Segmental and somatic dysfunction of thoracic region: Secondary | ICD-10-CM | POA: Diagnosis not present

## 2016-11-18 DIAGNOSIS — S335XXA Sprain of ligaments of lumbar spine, initial encounter: Secondary | ICD-10-CM | POA: Diagnosis not present

## 2016-11-21 DIAGNOSIS — M5442 Lumbago with sciatica, left side: Secondary | ICD-10-CM | POA: Diagnosis not present

## 2016-11-21 DIAGNOSIS — M9903 Segmental and somatic dysfunction of lumbar region: Secondary | ICD-10-CM | POA: Diagnosis not present

## 2016-11-21 DIAGNOSIS — M9902 Segmental and somatic dysfunction of thoracic region: Secondary | ICD-10-CM | POA: Diagnosis not present

## 2016-11-21 DIAGNOSIS — S335XXA Sprain of ligaments of lumbar spine, initial encounter: Secondary | ICD-10-CM | POA: Diagnosis not present

## 2016-11-23 DIAGNOSIS — S335XXA Sprain of ligaments of lumbar spine, initial encounter: Secondary | ICD-10-CM | POA: Diagnosis not present

## 2016-11-23 DIAGNOSIS — M9903 Segmental and somatic dysfunction of lumbar region: Secondary | ICD-10-CM | POA: Diagnosis not present

## 2016-11-23 DIAGNOSIS — M5442 Lumbago with sciatica, left side: Secondary | ICD-10-CM | POA: Diagnosis not present

## 2016-11-23 DIAGNOSIS — M9902 Segmental and somatic dysfunction of thoracic region: Secondary | ICD-10-CM | POA: Diagnosis not present

## 2016-11-28 DIAGNOSIS — M9902 Segmental and somatic dysfunction of thoracic region: Secondary | ICD-10-CM | POA: Diagnosis not present

## 2016-11-28 DIAGNOSIS — M9903 Segmental and somatic dysfunction of lumbar region: Secondary | ICD-10-CM | POA: Diagnosis not present

## 2016-11-28 DIAGNOSIS — S335XXA Sprain of ligaments of lumbar spine, initial encounter: Secondary | ICD-10-CM | POA: Diagnosis not present

## 2016-11-28 DIAGNOSIS — M5442 Lumbago with sciatica, left side: Secondary | ICD-10-CM | POA: Diagnosis not present

## 2016-12-01 DIAGNOSIS — M9902 Segmental and somatic dysfunction of thoracic region: Secondary | ICD-10-CM | POA: Diagnosis not present

## 2016-12-01 DIAGNOSIS — M9903 Segmental and somatic dysfunction of lumbar region: Secondary | ICD-10-CM | POA: Diagnosis not present

## 2016-12-01 DIAGNOSIS — M5442 Lumbago with sciatica, left side: Secondary | ICD-10-CM | POA: Diagnosis not present

## 2016-12-01 DIAGNOSIS — S335XXA Sprain of ligaments of lumbar spine, initial encounter: Secondary | ICD-10-CM | POA: Diagnosis not present

## 2016-12-05 DIAGNOSIS — M9902 Segmental and somatic dysfunction of thoracic region: Secondary | ICD-10-CM | POA: Diagnosis not present

## 2016-12-05 DIAGNOSIS — M9903 Segmental and somatic dysfunction of lumbar region: Secondary | ICD-10-CM | POA: Diagnosis not present

## 2016-12-05 DIAGNOSIS — S335XXA Sprain of ligaments of lumbar spine, initial encounter: Secondary | ICD-10-CM | POA: Diagnosis not present

## 2016-12-05 DIAGNOSIS — M5442 Lumbago with sciatica, left side: Secondary | ICD-10-CM | POA: Diagnosis not present

## 2016-12-08 DIAGNOSIS — M1712 Unilateral primary osteoarthritis, left knee: Secondary | ICD-10-CM | POA: Diagnosis not present

## 2016-12-08 DIAGNOSIS — M1711 Unilateral primary osteoarthritis, right knee: Secondary | ICD-10-CM | POA: Diagnosis not present

## 2016-12-08 DIAGNOSIS — M2241 Chondromalacia patellae, right knee: Secondary | ICD-10-CM | POA: Diagnosis not present

## 2016-12-08 DIAGNOSIS — Z6826 Body mass index (BMI) 26.0-26.9, adult: Secondary | ICD-10-CM | POA: Diagnosis not present

## 2016-12-12 DIAGNOSIS — Z6826 Body mass index (BMI) 26.0-26.9, adult: Secondary | ICD-10-CM | POA: Diagnosis not present

## 2016-12-12 DIAGNOSIS — M9903 Segmental and somatic dysfunction of lumbar region: Secondary | ICD-10-CM | POA: Diagnosis not present

## 2016-12-12 DIAGNOSIS — S335XXA Sprain of ligaments of lumbar spine, initial encounter: Secondary | ICD-10-CM | POA: Diagnosis not present

## 2016-12-12 DIAGNOSIS — M9902 Segmental and somatic dysfunction of thoracic region: Secondary | ICD-10-CM | POA: Diagnosis not present

## 2016-12-12 DIAGNOSIS — M5442 Lumbago with sciatica, left side: Secondary | ICD-10-CM | POA: Diagnosis not present

## 2016-12-12 DIAGNOSIS — M1712 Unilateral primary osteoarthritis, left knee: Secondary | ICD-10-CM | POA: Diagnosis not present

## 2016-12-16 DIAGNOSIS — M948X6 Other specified disorders of cartilage, lower leg: Secondary | ICD-10-CM | POA: Diagnosis not present

## 2016-12-16 DIAGNOSIS — R600 Localized edema: Secondary | ICD-10-CM | POA: Diagnosis not present

## 2016-12-16 DIAGNOSIS — M25562 Pain in left knee: Secondary | ICD-10-CM | POA: Diagnosis not present

## 2016-12-16 DIAGNOSIS — R531 Weakness: Secondary | ICD-10-CM | POA: Diagnosis not present

## 2016-12-19 DIAGNOSIS — M5442 Lumbago with sciatica, left side: Secondary | ICD-10-CM | POA: Diagnosis not present

## 2016-12-19 DIAGNOSIS — S335XXA Sprain of ligaments of lumbar spine, initial encounter: Secondary | ICD-10-CM | POA: Diagnosis not present

## 2016-12-19 DIAGNOSIS — M9903 Segmental and somatic dysfunction of lumbar region: Secondary | ICD-10-CM | POA: Diagnosis not present

## 2016-12-19 DIAGNOSIS — M9902 Segmental and somatic dysfunction of thoracic region: Secondary | ICD-10-CM | POA: Diagnosis not present

## 2016-12-20 DIAGNOSIS — M5410 Radiculopathy, site unspecified: Secondary | ICD-10-CM | POA: Diagnosis not present

## 2016-12-20 DIAGNOSIS — M2242 Chondromalacia patellae, left knee: Secondary | ICD-10-CM | POA: Diagnosis not present

## 2016-12-26 DIAGNOSIS — M9903 Segmental and somatic dysfunction of lumbar region: Secondary | ICD-10-CM | POA: Diagnosis not present

## 2016-12-26 DIAGNOSIS — S335XXA Sprain of ligaments of lumbar spine, initial encounter: Secondary | ICD-10-CM | POA: Diagnosis not present

## 2016-12-26 DIAGNOSIS — M5442 Lumbago with sciatica, left side: Secondary | ICD-10-CM | POA: Diagnosis not present

## 2016-12-26 DIAGNOSIS — M9902 Segmental and somatic dysfunction of thoracic region: Secondary | ICD-10-CM | POA: Diagnosis not present

## 2016-12-30 DIAGNOSIS — Z6826 Body mass index (BMI) 26.0-26.9, adult: Secondary | ICD-10-CM | POA: Diagnosis not present

## 2016-12-30 DIAGNOSIS — M222X1 Patellofemoral disorders, right knee: Secondary | ICD-10-CM | POA: Diagnosis not present

## 2016-12-30 DIAGNOSIS — M1712 Unilateral primary osteoarthritis, left knee: Secondary | ICD-10-CM | POA: Diagnosis not present

## 2016-12-30 DIAGNOSIS — M2242 Chondromalacia patellae, left knee: Secondary | ICD-10-CM | POA: Diagnosis not present

## 2017-01-09 DIAGNOSIS — M5442 Lumbago with sciatica, left side: Secondary | ICD-10-CM | POA: Diagnosis not present

## 2017-01-09 DIAGNOSIS — M9902 Segmental and somatic dysfunction of thoracic region: Secondary | ICD-10-CM | POA: Diagnosis not present

## 2017-01-09 DIAGNOSIS — S335XXA Sprain of ligaments of lumbar spine, initial encounter: Secondary | ICD-10-CM | POA: Diagnosis not present

## 2017-01-09 DIAGNOSIS — M9903 Segmental and somatic dysfunction of lumbar region: Secondary | ICD-10-CM | POA: Diagnosis not present

## 2017-01-11 DIAGNOSIS — M9902 Segmental and somatic dysfunction of thoracic region: Secondary | ICD-10-CM | POA: Diagnosis not present

## 2017-01-11 DIAGNOSIS — S335XXA Sprain of ligaments of lumbar spine, initial encounter: Secondary | ICD-10-CM | POA: Diagnosis not present

## 2017-01-11 DIAGNOSIS — M9903 Segmental and somatic dysfunction of lumbar region: Secondary | ICD-10-CM | POA: Diagnosis not present

## 2017-01-11 DIAGNOSIS — M5442 Lumbago with sciatica, left side: Secondary | ICD-10-CM | POA: Diagnosis not present

## 2017-01-16 DIAGNOSIS — M9903 Segmental and somatic dysfunction of lumbar region: Secondary | ICD-10-CM | POA: Diagnosis not present

## 2017-01-16 DIAGNOSIS — S335XXA Sprain of ligaments of lumbar spine, initial encounter: Secondary | ICD-10-CM | POA: Diagnosis not present

## 2017-01-16 DIAGNOSIS — M5442 Lumbago with sciatica, left side: Secondary | ICD-10-CM | POA: Diagnosis not present

## 2017-01-16 DIAGNOSIS — M9902 Segmental and somatic dysfunction of thoracic region: Secondary | ICD-10-CM | POA: Diagnosis not present

## 2017-01-19 DIAGNOSIS — M5442 Lumbago with sciatica, left side: Secondary | ICD-10-CM | POA: Diagnosis not present

## 2017-01-19 DIAGNOSIS — M9902 Segmental and somatic dysfunction of thoracic region: Secondary | ICD-10-CM | POA: Diagnosis not present

## 2017-01-19 DIAGNOSIS — M9903 Segmental and somatic dysfunction of lumbar region: Secondary | ICD-10-CM | POA: Diagnosis not present

## 2017-01-19 DIAGNOSIS — S335XXA Sprain of ligaments of lumbar spine, initial encounter: Secondary | ICD-10-CM | POA: Diagnosis not present

## 2017-01-23 DIAGNOSIS — S335XXA Sprain of ligaments of lumbar spine, initial encounter: Secondary | ICD-10-CM | POA: Diagnosis not present

## 2017-01-23 DIAGNOSIS — M9902 Segmental and somatic dysfunction of thoracic region: Secondary | ICD-10-CM | POA: Diagnosis not present

## 2017-01-23 DIAGNOSIS — M5442 Lumbago with sciatica, left side: Secondary | ICD-10-CM | POA: Diagnosis not present

## 2017-01-23 DIAGNOSIS — M9903 Segmental and somatic dysfunction of lumbar region: Secondary | ICD-10-CM | POA: Diagnosis not present

## 2017-01-24 DIAGNOSIS — M5410 Radiculopathy, site unspecified: Secondary | ICD-10-CM | POA: Diagnosis not present

## 2017-01-24 DIAGNOSIS — M2242 Chondromalacia patellae, left knee: Secondary | ICD-10-CM | POA: Diagnosis not present

## 2017-01-27 DIAGNOSIS — R531 Weakness: Secondary | ICD-10-CM | POA: Diagnosis not present

## 2017-01-27 DIAGNOSIS — M5137 Other intervertebral disc degeneration, lumbosacral region: Secondary | ICD-10-CM | POA: Diagnosis not present

## 2017-01-27 DIAGNOSIS — M47816 Spondylosis without myelopathy or radiculopathy, lumbar region: Secondary | ICD-10-CM | POA: Diagnosis not present

## 2017-01-27 DIAGNOSIS — M79605 Pain in left leg: Secondary | ICD-10-CM | POA: Diagnosis not present

## 2017-01-27 DIAGNOSIS — M5126 Other intervertebral disc displacement, lumbar region: Secondary | ICD-10-CM | POA: Diagnosis not present

## 2017-01-31 DIAGNOSIS — M5410 Radiculopathy, site unspecified: Secondary | ICD-10-CM | POA: Diagnosis not present

## 2017-01-31 DIAGNOSIS — M4726 Other spondylosis with radiculopathy, lumbar region: Secondary | ICD-10-CM | POA: Diagnosis not present

## 2017-02-03 DIAGNOSIS — S335XXA Sprain of ligaments of lumbar spine, initial encounter: Secondary | ICD-10-CM | POA: Diagnosis not present

## 2017-02-03 DIAGNOSIS — M5442 Lumbago with sciatica, left side: Secondary | ICD-10-CM | POA: Diagnosis not present

## 2017-02-03 DIAGNOSIS — M9903 Segmental and somatic dysfunction of lumbar region: Secondary | ICD-10-CM | POA: Diagnosis not present

## 2017-02-03 DIAGNOSIS — M9902 Segmental and somatic dysfunction of thoracic region: Secondary | ICD-10-CM | POA: Diagnosis not present

## 2017-02-08 DIAGNOSIS — M9903 Segmental and somatic dysfunction of lumbar region: Secondary | ICD-10-CM | POA: Diagnosis not present

## 2017-02-08 DIAGNOSIS — M9902 Segmental and somatic dysfunction of thoracic region: Secondary | ICD-10-CM | POA: Diagnosis not present

## 2017-02-08 DIAGNOSIS — M5442 Lumbago with sciatica, left side: Secondary | ICD-10-CM | POA: Diagnosis not present

## 2017-02-08 DIAGNOSIS — S335XXA Sprain of ligaments of lumbar spine, initial encounter: Secondary | ICD-10-CM | POA: Diagnosis not present

## 2017-02-17 DIAGNOSIS — G4733 Obstructive sleep apnea (adult) (pediatric): Secondary | ICD-10-CM | POA: Diagnosis not present

## 2017-02-17 DIAGNOSIS — F330 Major depressive disorder, recurrent, mild: Secondary | ICD-10-CM | POA: Diagnosis not present

## 2017-02-17 DIAGNOSIS — L719 Rosacea, unspecified: Secondary | ICD-10-CM | POA: Diagnosis not present

## 2017-02-17 DIAGNOSIS — M9903 Segmental and somatic dysfunction of lumbar region: Secondary | ICD-10-CM | POA: Diagnosis not present

## 2017-02-17 DIAGNOSIS — E1165 Type 2 diabetes mellitus with hyperglycemia: Secondary | ICD-10-CM | POA: Diagnosis not present

## 2017-02-17 DIAGNOSIS — Z6824 Body mass index (BMI) 24.0-24.9, adult: Secondary | ICD-10-CM | POA: Diagnosis not present

## 2017-02-17 DIAGNOSIS — R634 Abnormal weight loss: Secondary | ICD-10-CM | POA: Diagnosis not present

## 2017-02-17 DIAGNOSIS — M5442 Lumbago with sciatica, left side: Secondary | ICD-10-CM | POA: Diagnosis not present

## 2017-02-17 DIAGNOSIS — S335XXA Sprain of ligaments of lumbar spine, initial encounter: Secondary | ICD-10-CM | POA: Diagnosis not present

## 2017-02-17 DIAGNOSIS — I10 Essential (primary) hypertension: Secondary | ICD-10-CM | POA: Diagnosis not present

## 2017-02-17 DIAGNOSIS — M9902 Segmental and somatic dysfunction of thoracic region: Secondary | ICD-10-CM | POA: Diagnosis not present

## 2017-02-21 DIAGNOSIS — L719 Rosacea, unspecified: Secondary | ICD-10-CM | POA: Diagnosis not present

## 2017-02-21 DIAGNOSIS — Z6824 Body mass index (BMI) 24.0-24.9, adult: Secondary | ICD-10-CM | POA: Diagnosis not present

## 2017-02-21 DIAGNOSIS — E1165 Type 2 diabetes mellitus with hyperglycemia: Secondary | ICD-10-CM | POA: Diagnosis not present

## 2017-02-21 DIAGNOSIS — I10 Essential (primary) hypertension: Secondary | ICD-10-CM | POA: Diagnosis not present

## 2017-02-21 DIAGNOSIS — R634 Abnormal weight loss: Secondary | ICD-10-CM | POA: Diagnosis not present

## 2017-02-21 DIAGNOSIS — G4733 Obstructive sleep apnea (adult) (pediatric): Secondary | ICD-10-CM | POA: Diagnosis not present

## 2017-02-24 DIAGNOSIS — M5442 Lumbago with sciatica, left side: Secondary | ICD-10-CM | POA: Diagnosis not present

## 2017-02-24 DIAGNOSIS — M9903 Segmental and somatic dysfunction of lumbar region: Secondary | ICD-10-CM | POA: Diagnosis not present

## 2017-02-24 DIAGNOSIS — M9902 Segmental and somatic dysfunction of thoracic region: Secondary | ICD-10-CM | POA: Diagnosis not present

## 2017-02-24 DIAGNOSIS — S335XXA Sprain of ligaments of lumbar spine, initial encounter: Secondary | ICD-10-CM | POA: Diagnosis not present

## 2017-02-27 NOTE — Progress Notes (Signed)
Cardiology Office Note   Date:  03/02/2017   ID:  [name], [name] Mar 28, 1951, MRN [id number]  PCP:  [name], MD  Cardiologist:   Minus Breeding, MD  Referring:  [name], MD  Chief Complaint  Patient presents with  . Atrial Fibrillation      History of Present Illness: [name] is a 66 y.o. male who is referred by Dr. Nadara Mustard for evaluation of atrial fib. His long history of coronary artery disease. He apparently had stenting with a bare metal stent to an occluded LAD in 2001. He had bypass in 2003 that was 5 vessel. He did have a reduced ejection fraction prior to that for follow-up in 2007 his EF was 60%. He had a low risk perfusion study that time. He's had SVT treated with beta blockers.    He presented to Dr. Nadara Mustard for evaluation of knee pain and was found to be in atrial fibrillation. He did not feel this. Have any palpitations he's had no presyncope or syncope. He's had no chest pressure, neck or arm discomfort. He denies any shortness of breath, PND or orthopnea. He's had no weight gain or edema. He otherwise usually feels well. He wasn't taking medications for a long while and his diabetes was not well controlled but his on these medications now. He actually drank quite a few beers but he stopped completely.   Past Medical History:  Diagnosis Date  . CAD (coronary artery disease)   . Hypertension   . Obstructive sleep apnea    CPAP  . Rosacea   . Type 2 diabetes mellitus with hyperglycemia [facility])     Past Surgical History:  Procedure Laterality Date  . CORONARY ARTERY BYPASS GRAFT     LIMA to the LAD, sequential SVG to ramus intermediate and OM1, sequential SVG to PDA and posterior lateral     Current Outpatient Prescriptions  Medication Sig Dispense Refill  . aspirin EC 81 MG tablet Take 81 mg by mouth daily.    . carvedilol (COREG) 12.5 MG tablet Take 12.5 mg by mouth daily.     . citalopram (CELEXA) 40 MG tablet Take 20 mg by mouth  daily.    Marland Kitchen glipiZIDE (GLUCOTROL) 10 MG tablet Take 10 mg by mouth daily before breakfast.    . metFORMIN (GLUCOPHAGE-XR) 500 MG 24 hr tablet Take 2,000 mg by mouth daily with breakfast.    . Naproxen Sod-Diphenhydramine (ALEVE PM) 220-25 MG TABS Take 2 tablets by mouth as needed.    . simvastatin (ZOCOR) 40 MG tablet Take 40 mg by mouth daily.    . Turmeric Curcumin 500 MG CAPS Take 2 capsules by mouth daily.    . valsartan (DIOVAN) 160 MG tablet Take 160 mg by mouth daily.    . rivaroxaban (XARELTO) 20 MG TABS tablet Take 1 tablet (20 mg total) by mouth daily with supper. 30 tablet 3   No current facility-administered medications for this visit.     Allergies:   Patient has no known allergies.    Social History:  The patient  reports that he has never smoked. His smokeless tobacco use includes Snuff.   Family History:  The patient's family history includes Breast cancer in his mother; CAD (age of onset: 2) in his brother; Diabetes in his brother and mother; Heart attack in his mother; Heart attack (age of onset: 76) in his father; Heart disease in his father; Heart disease (age of onset: 38) in his mother;  Kidney cancer in his father.    ROS:  Please see the history of present illness.   Otherwise, review of systems are positive for none.   All other systems are reviewed and negative.    PHYSICAL EXAM: VS:  BP 128/72 (BP Location: Right Arm, Cuff Size: Normal)   Pulse 72   Ht 5\' 9"  (1.753 m)   Wt 165 lb 6.4 oz (75 kg)   SpO2 98%   BMI 24.43 kg/m  , BMI Body mass index is 24.43 kg/m. GENERAL:  Well appearing HEENT:  Pupils equal round and reactive, fundi not visualized, oral mucosa unremarkable NECK:  No jugular venous distention, waveform within normal limits, carotid upstroke brisk and symmetric, no bruits, no thyromegaly LYMPHATICS:  No cervical, inguinal adenopathy LUNGS:  Clear to auscultation bilaterally BACK:  No CVA tenderness CHEST:  Unremarkable HEART:  PMI not  displaced or sustained,S1 and S2 within normal limits, no S3, no clicks, no rubs, no murmurs, irregular ABD:  Flat, positive bowel sounds normal in frequency in pitch, no bruits, no rebound, no guarding, no midline pulsatile mass, no hepatomegaly, no splenomegaly EXT:  2 plus pulses throughout, no edema, no cyanosis no clubbing SKIN:  No rashes no nodules NEURO:  Cranial nerves II through XII grossly intact, motor grossly intact throughout PSYCH:  Cognitively intact, oriented to person place and time    EKG:  EKG is not ordered today. The ekg ordered 02/17/17 atrial fibrillation, rate 97, leftward axis, intervals within normal limits, poor anterior R wave progression, no acute ST-T wave changes.   Recent Labs: No results found for requested labs within last 8760 hours.    Lipid Panel No results found for: CHOL, TRIG, HDL, CHOLHDL, VLDL, LDLCALC, LDLDIRECT    Wt Readings from Last 3 Encounters:  03/01/17 165 lb 6.4 oz (75 kg)      Other studies Reviewed: Additional studies/ records that were reviewed today include: Office records and labs. Review of the above records demonstrates:  Please see elsewhere in the note.     ASSESSMENT AND PLAN:  ATRIAL FIB:  [name] has a CHA2DS2 - VASc score of 3. We had a long discussion about risks benefits and the mechanism of this. At this point he understands the indication for anticoagulation and he'll be started on Xarelto 20 mg daily. I'll check an echocardiogram. I'll place a 24-hour Holter to make sure he has good rate control. At this point I really don't think there is significant advantage to cardioversion and he and I discussed this.   CAD:  He can continue with risk reduction. He should probably have routine screening POET (Plain Old Exercise Treadmill) at some point but I'll wait until he's had the above evaluation.    DM:  His diabetes is not well controlled but now is on better diet and is taking his  medications.  Discussed at length with the patient today.  The patient does not have concerns regarding medicines.  The following changes have been made:  As above  Labs/ tests ordered today include:   Orders Placed This Encounter  Procedures  . Holter monitor - 24 hour  . ECHOCARDIOGRAM COMPLETE     Disposition:   FU with [place] MD after the above studies.     Signed, Minus Breeding, MD  03/02/2017 8:51 AM    [facility]

## 2017-03-01 ENCOUNTER — Telehealth: Payer: Self-pay | Admitting: Cardiology

## 2017-03-01 ENCOUNTER — Ambulatory Visit (INDEPENDENT_AMBULATORY_CARE_PROVIDER_SITE_OTHER): Payer: Medicare Other | Admitting: Cardiology

## 2017-03-01 ENCOUNTER — Encounter: Payer: Self-pay | Admitting: Cardiology

## 2017-03-01 VITALS — BP 128/72 | HR 72 | Ht 69.0 in | Wt 165.4 lb

## 2017-03-01 DIAGNOSIS — I2510 Atherosclerotic heart disease of native coronary artery without angina pectoris: Secondary | ICD-10-CM

## 2017-03-01 DIAGNOSIS — M5442 Lumbago with sciatica, left side: Secondary | ICD-10-CM | POA: Diagnosis not present

## 2017-03-01 DIAGNOSIS — I481 Persistent atrial fibrillation: Secondary | ICD-10-CM | POA: Diagnosis not present

## 2017-03-01 DIAGNOSIS — I4819 Other persistent atrial fibrillation: Secondary | ICD-10-CM

## 2017-03-01 DIAGNOSIS — M9902 Segmental and somatic dysfunction of thoracic region: Secondary | ICD-10-CM | POA: Diagnosis not present

## 2017-03-01 DIAGNOSIS — R634 Abnormal weight loss: Secondary | ICD-10-CM | POA: Diagnosis not present

## 2017-03-01 DIAGNOSIS — S335XXA Sprain of ligaments of lumbar spine, initial encounter: Secondary | ICD-10-CM | POA: Diagnosis not present

## 2017-03-01 DIAGNOSIS — G4733 Obstructive sleep apnea (adult) (pediatric): Secondary | ICD-10-CM | POA: Diagnosis not present

## 2017-03-01 DIAGNOSIS — Z1389 Encounter for screening for other disorder: Secondary | ICD-10-CM | POA: Diagnosis not present

## 2017-03-01 DIAGNOSIS — E118 Type 2 diabetes mellitus with unspecified complications: Secondary | ICD-10-CM

## 2017-03-01 DIAGNOSIS — M9903 Segmental and somatic dysfunction of lumbar region: Secondary | ICD-10-CM | POA: Diagnosis not present

## 2017-03-01 DIAGNOSIS — I10 Essential (primary) hypertension: Secondary | ICD-10-CM | POA: Diagnosis not present

## 2017-03-01 DIAGNOSIS — Z794 Long term (current) use of insulin: Secondary | ICD-10-CM

## 2017-03-01 DIAGNOSIS — E1165 Type 2 diabetes mellitus with hyperglycemia: Secondary | ICD-10-CM | POA: Diagnosis not present

## 2017-03-01 MED ORDER — RIVAROXABAN 20 MG PO TABS: 20.0000 mg | ORAL_TABLET | Freq: Every day | ORAL | 3 refills | Status: DC

## 2017-03-01 NOTE — Patient Instructions (Signed)
Medication Instructions:  Your physician recommends that you continue on your current medications as directed. Please refer to the Current Medication list given to you today.  Labwork: NONE  Testing/Procedures: Your physician has requested that you have an echocardiogram. Echocardiography is a painless test that uses sound waves to create images of your heart. It provides your doctor with information about the size and shape of your heart and how well your heart's chambers and valves are working. This procedure takes approximately one hour. There are no restrictions for this procedure.  Your physician has recommended that you wear a holter monitor FOR 24 HOURS. Holter monitors are medical devices that record the heart's electrical activity. Doctors most often use these monitors to diagnose arrhythmias. Arrhythmias are problems with the speed or rhythm of the heartbeat. The monitor is a small, portable device. You can wear one while you do your normal daily activities. This is usually used to diagnose what is causing palpitations/syncope (passing out).  Follow-Up: Your physician recommends that you schedule a follow-up appointment PENDING TEST RESULTS   Any Other Special Instructions Will Be Listed Below (If Applicable).  If you need a refill on your cardiac medications before your next appointment, please call your pharmacy. 

## 2017-03-01 NOTE — Telephone Encounter (Signed)
[name] called stating that the Xarelto 20 mg is going to cost $47.00 per month . Wants to know if Patient can go on Warfarin (which is no cost to patient. )

## 2017-03-02 ENCOUNTER — Encounter: Payer: Self-pay | Admitting: Cardiology

## 2017-03-02 NOTE — Telephone Encounter (Signed)
Patient decided to go ahead and use this prescription called in.  Asked it please disregard previous request

## 2017-03-02 NOTE — Telephone Encounter (Signed)
Forward to Dr. Percival Spanish for notification.

## 2017-03-07 DIAGNOSIS — G4733 Obstructive sleep apnea (adult) (pediatric): Secondary | ICD-10-CM | POA: Diagnosis not present

## 2017-03-07 DIAGNOSIS — E1165 Type 2 diabetes mellitus with hyperglycemia: Secondary | ICD-10-CM | POA: Diagnosis not present

## 2017-03-07 DIAGNOSIS — I10 Essential (primary) hypertension: Secondary | ICD-10-CM | POA: Diagnosis not present

## 2017-03-08 DIAGNOSIS — M9903 Segmental and somatic dysfunction of lumbar region: Secondary | ICD-10-CM | POA: Diagnosis not present

## 2017-03-08 DIAGNOSIS — E1165 Type 2 diabetes mellitus with hyperglycemia: Secondary | ICD-10-CM | POA: Diagnosis not present

## 2017-03-08 DIAGNOSIS — S335XXA Sprain of ligaments of lumbar spine, initial encounter: Secondary | ICD-10-CM | POA: Diagnosis not present

## 2017-03-08 DIAGNOSIS — G4733 Obstructive sleep apnea (adult) (pediatric): Secondary | ICD-10-CM | POA: Diagnosis not present

## 2017-03-08 DIAGNOSIS — Z6827 Body mass index (BMI) 27.0-27.9, adult: Secondary | ICD-10-CM | POA: Diagnosis not present

## 2017-03-08 DIAGNOSIS — M5442 Lumbago with sciatica, left side: Secondary | ICD-10-CM | POA: Diagnosis not present

## 2017-03-08 DIAGNOSIS — M9902 Segmental and somatic dysfunction of thoracic region: Secondary | ICD-10-CM | POA: Diagnosis not present

## 2017-03-08 DIAGNOSIS — I10 Essential (primary) hypertension: Secondary | ICD-10-CM | POA: Diagnosis not present

## 2017-03-08 DIAGNOSIS — E7800 Pure hypercholesterolemia, unspecified: Secondary | ICD-10-CM | POA: Diagnosis not present

## 2017-03-17 DIAGNOSIS — M9903 Segmental and somatic dysfunction of lumbar region: Secondary | ICD-10-CM | POA: Diagnosis not present

## 2017-03-17 DIAGNOSIS — M9902 Segmental and somatic dysfunction of thoracic region: Secondary | ICD-10-CM | POA: Diagnosis not present

## 2017-03-17 DIAGNOSIS — S335XXA Sprain of ligaments of lumbar spine, initial encounter: Secondary | ICD-10-CM | POA: Diagnosis not present

## 2017-03-17 DIAGNOSIS — M5442 Lumbago with sciatica, left side: Secondary | ICD-10-CM | POA: Diagnosis not present

## 2017-03-20 DIAGNOSIS — M9902 Segmental and somatic dysfunction of thoracic region: Secondary | ICD-10-CM | POA: Diagnosis not present

## 2017-03-20 DIAGNOSIS — M9903 Segmental and somatic dysfunction of lumbar region: Secondary | ICD-10-CM | POA: Diagnosis not present

## 2017-03-20 DIAGNOSIS — M5442 Lumbago with sciatica, left side: Secondary | ICD-10-CM | POA: Diagnosis not present

## 2017-03-20 DIAGNOSIS — S335XXA Sprain of ligaments of lumbar spine, initial encounter: Secondary | ICD-10-CM | POA: Diagnosis not present

## 2017-03-22 ENCOUNTER — Other Ambulatory Visit: Payer: Self-pay

## 2017-03-22 ENCOUNTER — Ambulatory Visit (INDEPENDENT_AMBULATORY_CARE_PROVIDER_SITE_OTHER): Payer: Medicare Other

## 2017-03-22 DIAGNOSIS — I481 Persistent atrial fibrillation: Secondary | ICD-10-CM | POA: Diagnosis not present

## 2017-03-22 DIAGNOSIS — I4819 Other persistent atrial fibrillation: Secondary | ICD-10-CM

## 2017-03-23 DIAGNOSIS — R262 Difficulty in walking, not elsewhere classified: Secondary | ICD-10-CM | POA: Diagnosis not present

## 2017-03-23 DIAGNOSIS — M6281 Muscle weakness (generalized): Secondary | ICD-10-CM | POA: Diagnosis not present

## 2017-03-27 DIAGNOSIS — S335XXA Sprain of ligaments of lumbar spine, initial encounter: Secondary | ICD-10-CM | POA: Diagnosis not present

## 2017-03-27 DIAGNOSIS — M9903 Segmental and somatic dysfunction of lumbar region: Secondary | ICD-10-CM | POA: Diagnosis not present

## 2017-03-27 DIAGNOSIS — M5442 Lumbago with sciatica, left side: Secondary | ICD-10-CM | POA: Diagnosis not present

## 2017-03-27 DIAGNOSIS — M9902 Segmental and somatic dysfunction of thoracic region: Secondary | ICD-10-CM | POA: Diagnosis not present

## 2017-03-28 DIAGNOSIS — M6281 Muscle weakness (generalized): Secondary | ICD-10-CM | POA: Diagnosis not present

## 2017-03-28 DIAGNOSIS — R262 Difficulty in walking, not elsewhere classified: Secondary | ICD-10-CM | POA: Diagnosis not present

## 2017-03-29 ENCOUNTER — Telehealth: Payer: Self-pay | Admitting: *Deleted

## 2017-03-29 DIAGNOSIS — I340 Nonrheumatic mitral (valve) insufficiency: Secondary | ICD-10-CM

## 2017-03-29 NOTE — Telephone Encounter (Signed)
Order placed for 1 year 

## 2017-03-29 NOTE — Telephone Encounter (Signed)
-----   Message from Minus Breeding, MD sent at 03/23/2017  4:32 AM EDT ----- EF is low normal to mildly reduced.  There is moderate MR.  I will follow up with repeat echoes probably with the next one in one year. Call Mr. Ungaro with the results and send results to [name], MD

## 2017-03-31 DIAGNOSIS — M6281 Muscle weakness (generalized): Secondary | ICD-10-CM | POA: Diagnosis not present

## 2017-03-31 DIAGNOSIS — R262 Difficulty in walking, not elsewhere classified: Secondary | ICD-10-CM | POA: Diagnosis not present

## 2017-04-03 DIAGNOSIS — M9903 Segmental and somatic dysfunction of lumbar region: Secondary | ICD-10-CM | POA: Diagnosis not present

## 2017-04-03 DIAGNOSIS — S335XXA Sprain of ligaments of lumbar spine, initial encounter: Secondary | ICD-10-CM | POA: Diagnosis not present

## 2017-04-03 DIAGNOSIS — M5442 Lumbago with sciatica, left side: Secondary | ICD-10-CM | POA: Diagnosis not present

## 2017-04-03 DIAGNOSIS — M9902 Segmental and somatic dysfunction of thoracic region: Secondary | ICD-10-CM | POA: Diagnosis not present

## 2017-04-04 DIAGNOSIS — M6281 Muscle weakness (generalized): Secondary | ICD-10-CM | POA: Diagnosis not present

## 2017-04-04 DIAGNOSIS — R262 Difficulty in walking, not elsewhere classified: Secondary | ICD-10-CM | POA: Diagnosis not present

## 2017-04-07 DIAGNOSIS — M6281 Muscle weakness (generalized): Secondary | ICD-10-CM | POA: Diagnosis not present

## 2017-04-07 DIAGNOSIS — R262 Difficulty in walking, not elsewhere classified: Secondary | ICD-10-CM | POA: Diagnosis not present

## 2017-04-10 DIAGNOSIS — M9903 Segmental and somatic dysfunction of lumbar region: Secondary | ICD-10-CM | POA: Diagnosis not present

## 2017-04-10 DIAGNOSIS — S335XXA Sprain of ligaments of lumbar spine, initial encounter: Secondary | ICD-10-CM | POA: Diagnosis not present

## 2017-04-10 DIAGNOSIS — M9902 Segmental and somatic dysfunction of thoracic region: Secondary | ICD-10-CM | POA: Diagnosis not present

## 2017-04-10 DIAGNOSIS — M5442 Lumbago with sciatica, left side: Secondary | ICD-10-CM | POA: Diagnosis not present

## 2017-04-11 DIAGNOSIS — R262 Difficulty in walking, not elsewhere classified: Secondary | ICD-10-CM | POA: Diagnosis not present

## 2017-04-11 DIAGNOSIS — M6281 Muscle weakness (generalized): Secondary | ICD-10-CM | POA: Diagnosis not present

## 2017-04-14 DIAGNOSIS — H25813 Combined forms of age-related cataract, bilateral: Secondary | ICD-10-CM | POA: Diagnosis not present

## 2017-04-14 DIAGNOSIS — E1165 Type 2 diabetes mellitus with hyperglycemia: Secondary | ICD-10-CM | POA: Diagnosis not present

## 2017-04-14 DIAGNOSIS — H5201 Hypermetropia, right eye: Secondary | ICD-10-CM | POA: Diagnosis not present

## 2017-04-14 DIAGNOSIS — E113413 Type 2 diabetes mellitus with severe nonproliferative diabetic retinopathy with macular edema, bilateral: Secondary | ICD-10-CM | POA: Diagnosis not present

## 2017-04-17 DIAGNOSIS — S335XXA Sprain of ligaments of lumbar spine, initial encounter: Secondary | ICD-10-CM | POA: Diagnosis not present

## 2017-04-17 DIAGNOSIS — M5442 Lumbago with sciatica, left side: Secondary | ICD-10-CM | POA: Diagnosis not present

## 2017-04-17 DIAGNOSIS — M9903 Segmental and somatic dysfunction of lumbar region: Secondary | ICD-10-CM | POA: Diagnosis not present

## 2017-04-17 DIAGNOSIS — M9902 Segmental and somatic dysfunction of thoracic region: Secondary | ICD-10-CM | POA: Diagnosis not present

## 2017-04-18 DIAGNOSIS — M6281 Muscle weakness (generalized): Secondary | ICD-10-CM | POA: Diagnosis not present

## 2017-04-18 DIAGNOSIS — R262 Difficulty in walking, not elsewhere classified: Secondary | ICD-10-CM | POA: Diagnosis not present

## 2017-04-21 DIAGNOSIS — R262 Difficulty in walking, not elsewhere classified: Secondary | ICD-10-CM | POA: Diagnosis not present

## 2017-04-21 DIAGNOSIS — M6281 Muscle weakness (generalized): Secondary | ICD-10-CM | POA: Diagnosis not present

## 2017-04-24 DIAGNOSIS — M9902 Segmental and somatic dysfunction of thoracic region: Secondary | ICD-10-CM | POA: Diagnosis not present

## 2017-04-24 DIAGNOSIS — M5442 Lumbago with sciatica, left side: Secondary | ICD-10-CM | POA: Diagnosis not present

## 2017-04-24 DIAGNOSIS — M9903 Segmental and somatic dysfunction of lumbar region: Secondary | ICD-10-CM | POA: Diagnosis not present

## 2017-04-24 DIAGNOSIS — S335XXA Sprain of ligaments of lumbar spine, initial encounter: Secondary | ICD-10-CM | POA: Diagnosis not present

## 2017-04-25 DIAGNOSIS — R262 Difficulty in walking, not elsewhere classified: Secondary | ICD-10-CM | POA: Diagnosis not present

## 2017-04-25 DIAGNOSIS — M6281 Muscle weakness (generalized): Secondary | ICD-10-CM | POA: Diagnosis not present

## 2017-04-28 DIAGNOSIS — H3582 Retinal ischemia: Secondary | ICD-10-CM | POA: Diagnosis not present

## 2017-04-28 DIAGNOSIS — H2513 Age-related nuclear cataract, bilateral: Secondary | ICD-10-CM | POA: Diagnosis not present

## 2017-04-28 DIAGNOSIS — E113413 Type 2 diabetes mellitus with severe nonproliferative diabetic retinopathy with macular edema, bilateral: Secondary | ICD-10-CM | POA: Diagnosis not present

## 2017-05-02 DIAGNOSIS — M6281 Muscle weakness (generalized): Secondary | ICD-10-CM | POA: Diagnosis not present

## 2017-05-02 DIAGNOSIS — R262 Difficulty in walking, not elsewhere classified: Secondary | ICD-10-CM | POA: Diagnosis not present

## 2017-05-09 DIAGNOSIS — M9903 Segmental and somatic dysfunction of lumbar region: Secondary | ICD-10-CM | POA: Diagnosis not present

## 2017-05-09 DIAGNOSIS — M9902 Segmental and somatic dysfunction of thoracic region: Secondary | ICD-10-CM | POA: Diagnosis not present

## 2017-05-09 DIAGNOSIS — M5442 Lumbago with sciatica, left side: Secondary | ICD-10-CM | POA: Diagnosis not present

## 2017-05-09 DIAGNOSIS — S335XXA Sprain of ligaments of lumbar spine, initial encounter: Secondary | ICD-10-CM | POA: Diagnosis not present

## 2017-05-10 DIAGNOSIS — R262 Difficulty in walking, not elsewhere classified: Secondary | ICD-10-CM | POA: Diagnosis not present

## 2017-05-10 DIAGNOSIS — M6281 Muscle weakness (generalized): Secondary | ICD-10-CM | POA: Diagnosis not present

## 2017-05-16 DIAGNOSIS — M6281 Muscle weakness (generalized): Secondary | ICD-10-CM | POA: Diagnosis not present

## 2017-05-16 DIAGNOSIS — R262 Difficulty in walking, not elsewhere classified: Secondary | ICD-10-CM | POA: Diagnosis not present

## 2017-05-18 DIAGNOSIS — M6281 Muscle weakness (generalized): Secondary | ICD-10-CM | POA: Diagnosis not present

## 2017-05-18 DIAGNOSIS — R262 Difficulty in walking, not elsewhere classified: Secondary | ICD-10-CM | POA: Diagnosis not present

## 2017-05-25 DIAGNOSIS — M6281 Muscle weakness (generalized): Secondary | ICD-10-CM | POA: Diagnosis not present

## 2017-05-25 DIAGNOSIS — R262 Difficulty in walking, not elsewhere classified: Secondary | ICD-10-CM | POA: Diagnosis not present

## 2017-05-29 DIAGNOSIS — E113413 Type 2 diabetes mellitus with severe nonproliferative diabetic retinopathy with macular edema, bilateral: Secondary | ICD-10-CM | POA: Diagnosis not present

## 2017-05-29 DIAGNOSIS — H3582 Retinal ischemia: Secondary | ICD-10-CM | POA: Diagnosis not present

## 2017-05-29 DIAGNOSIS — H2513 Age-related nuclear cataract, bilateral: Secondary | ICD-10-CM | POA: Diagnosis not present

## 2017-05-30 DIAGNOSIS — R262 Difficulty in walking, not elsewhere classified: Secondary | ICD-10-CM | POA: Diagnosis not present

## 2017-05-30 DIAGNOSIS — M6281 Muscle weakness (generalized): Secondary | ICD-10-CM | POA: Diagnosis not present

## 2017-06-01 DIAGNOSIS — M6281 Muscle weakness (generalized): Secondary | ICD-10-CM | POA: Diagnosis not present

## 2017-06-01 DIAGNOSIS — R262 Difficulty in walking, not elsewhere classified: Secondary | ICD-10-CM | POA: Diagnosis not present

## 2017-06-06 DIAGNOSIS — M6281 Muscle weakness (generalized): Secondary | ICD-10-CM | POA: Diagnosis not present

## 2017-06-06 DIAGNOSIS — R262 Difficulty in walking, not elsewhere classified: Secondary | ICD-10-CM | POA: Diagnosis not present

## 2017-06-07 DIAGNOSIS — F330 Major depressive disorder, recurrent, mild: Secondary | ICD-10-CM | POA: Diagnosis not present

## 2017-06-07 DIAGNOSIS — M9903 Segmental and somatic dysfunction of lumbar region: Secondary | ICD-10-CM | POA: Diagnosis not present

## 2017-06-07 DIAGNOSIS — E1165 Type 2 diabetes mellitus with hyperglycemia: Secondary | ICD-10-CM | POA: Diagnosis not present

## 2017-06-07 DIAGNOSIS — F3289 Other specified depressive episodes: Secondary | ICD-10-CM | POA: Diagnosis not present

## 2017-06-07 DIAGNOSIS — I10 Essential (primary) hypertension: Secondary | ICD-10-CM | POA: Diagnosis not present

## 2017-06-07 DIAGNOSIS — E538 Deficiency of other specified B group vitamins: Secondary | ICD-10-CM | POA: Diagnosis not present

## 2017-06-07 DIAGNOSIS — S335XXA Sprain of ligaments of lumbar spine, initial encounter: Secondary | ICD-10-CM | POA: Diagnosis not present

## 2017-06-07 DIAGNOSIS — E7800 Pure hypercholesterolemia, unspecified: Secondary | ICD-10-CM | POA: Diagnosis not present

## 2017-06-07 DIAGNOSIS — M9902 Segmental and somatic dysfunction of thoracic region: Secondary | ICD-10-CM | POA: Diagnosis not present

## 2017-06-07 DIAGNOSIS — M5442 Lumbago with sciatica, left side: Secondary | ICD-10-CM | POA: Diagnosis not present

## 2017-06-08 DIAGNOSIS — R262 Difficulty in walking, not elsewhere classified: Secondary | ICD-10-CM | POA: Diagnosis not present

## 2017-06-08 DIAGNOSIS — M6281 Muscle weakness (generalized): Secondary | ICD-10-CM | POA: Diagnosis not present

## 2017-06-09 DIAGNOSIS — Z23 Encounter for immunization: Secondary | ICD-10-CM | POA: Diagnosis not present

## 2017-06-09 DIAGNOSIS — E1165 Type 2 diabetes mellitus with hyperglycemia: Secondary | ICD-10-CM | POA: Diagnosis not present

## 2017-06-09 DIAGNOSIS — Z6825 Body mass index (BMI) 25.0-25.9, adult: Secondary | ICD-10-CM | POA: Diagnosis not present

## 2017-06-09 DIAGNOSIS — E7800 Pure hypercholesterolemia, unspecified: Secondary | ICD-10-CM | POA: Diagnosis not present

## 2017-06-09 DIAGNOSIS — I10 Essential (primary) hypertension: Secondary | ICD-10-CM | POA: Diagnosis not present

## 2017-06-09 DIAGNOSIS — G4733 Obstructive sleep apnea (adult) (pediatric): Secondary | ICD-10-CM | POA: Diagnosis not present

## 2017-06-12 ENCOUNTER — Other Ambulatory Visit: Payer: Self-pay | Admitting: Cardiology

## 2017-06-16 DIAGNOSIS — M25562 Pain in left knee: Secondary | ICD-10-CM | POA: Diagnosis not present

## 2017-06-16 DIAGNOSIS — R262 Difficulty in walking, not elsewhere classified: Secondary | ICD-10-CM | POA: Diagnosis not present

## 2017-07-03 DIAGNOSIS — E113413 Type 2 diabetes mellitus with severe nonproliferative diabetic retinopathy with macular edema, bilateral: Secondary | ICD-10-CM | POA: Diagnosis not present

## 2017-07-03 DIAGNOSIS — H3582 Retinal ischemia: Secondary | ICD-10-CM | POA: Diagnosis not present

## 2017-07-06 DIAGNOSIS — M9903 Segmental and somatic dysfunction of lumbar region: Secondary | ICD-10-CM | POA: Diagnosis not present

## 2017-07-06 DIAGNOSIS — M5442 Lumbago with sciatica, left side: Secondary | ICD-10-CM | POA: Diagnosis not present

## 2017-07-06 DIAGNOSIS — S335XXA Sprain of ligaments of lumbar spine, initial encounter: Secondary | ICD-10-CM | POA: Diagnosis not present

## 2017-07-06 DIAGNOSIS — M9902 Segmental and somatic dysfunction of thoracic region: Secondary | ICD-10-CM | POA: Diagnosis not present

## 2017-07-31 DIAGNOSIS — E113413 Type 2 diabetes mellitus with severe nonproliferative diabetic retinopathy with macular edema, bilateral: Secondary | ICD-10-CM | POA: Diagnosis not present

## 2017-07-31 DIAGNOSIS — H2513 Age-related nuclear cataract, bilateral: Secondary | ICD-10-CM | POA: Diagnosis not present

## 2017-07-31 DIAGNOSIS — H3582 Retinal ischemia: Secondary | ICD-10-CM | POA: Diagnosis not present

## 2017-08-11 DIAGNOSIS — S335XXA Sprain of ligaments of lumbar spine, initial encounter: Secondary | ICD-10-CM | POA: Diagnosis not present

## 2017-08-11 DIAGNOSIS — M9903 Segmental and somatic dysfunction of lumbar region: Secondary | ICD-10-CM | POA: Diagnosis not present

## 2017-08-11 DIAGNOSIS — M9902 Segmental and somatic dysfunction of thoracic region: Secondary | ICD-10-CM | POA: Diagnosis not present

## 2017-08-11 DIAGNOSIS — M5442 Lumbago with sciatica, left side: Secondary | ICD-10-CM | POA: Diagnosis not present

## 2017-08-28 DIAGNOSIS — E113413 Type 2 diabetes mellitus with severe nonproliferative diabetic retinopathy with macular edema, bilateral: Secondary | ICD-10-CM | POA: Diagnosis not present

## 2017-08-28 DIAGNOSIS — H3582 Retinal ischemia: Secondary | ICD-10-CM | POA: Diagnosis not present

## 2017-08-28 DIAGNOSIS — E113412 Type 2 diabetes mellitus with severe nonproliferative diabetic retinopathy with macular edema, left eye: Secondary | ICD-10-CM | POA: Diagnosis not present

## 2017-08-28 DIAGNOSIS — E113411 Type 2 diabetes mellitus with severe nonproliferative diabetic retinopathy with macular edema, right eye: Secondary | ICD-10-CM | POA: Diagnosis not present

## 2017-09-25 DIAGNOSIS — E113413 Type 2 diabetes mellitus with severe nonproliferative diabetic retinopathy with macular edema, bilateral: Secondary | ICD-10-CM | POA: Diagnosis not present

## 2017-10-13 DIAGNOSIS — E7800 Pure hypercholesterolemia, unspecified: Secondary | ICD-10-CM | POA: Diagnosis not present

## 2017-10-13 DIAGNOSIS — E1165 Type 2 diabetes mellitus with hyperglycemia: Secondary | ICD-10-CM | POA: Diagnosis not present

## 2017-10-13 DIAGNOSIS — G4733 Obstructive sleep apnea (adult) (pediatric): Secondary | ICD-10-CM | POA: Diagnosis not present

## 2017-10-13 DIAGNOSIS — I10 Essential (primary) hypertension: Secondary | ICD-10-CM | POA: Diagnosis not present

## 2017-10-20 DIAGNOSIS — E1165 Type 2 diabetes mellitus with hyperglycemia: Secondary | ICD-10-CM | POA: Diagnosis not present

## 2017-10-20 DIAGNOSIS — Z0001 Encounter for general adult medical examination with abnormal findings: Secondary | ICD-10-CM | POA: Diagnosis not present

## 2017-10-20 DIAGNOSIS — I10 Essential (primary) hypertension: Secondary | ICD-10-CM | POA: Diagnosis not present

## 2017-10-20 DIAGNOSIS — L719 Rosacea, unspecified: Secondary | ICD-10-CM | POA: Diagnosis not present

## 2017-10-20 DIAGNOSIS — Z6826 Body mass index (BMI) 26.0-26.9, adult: Secondary | ICD-10-CM | POA: Diagnosis not present

## 2017-10-23 DIAGNOSIS — E113313 Type 2 diabetes mellitus with moderate nonproliferative diabetic retinopathy with macular edema, bilateral: Secondary | ICD-10-CM | POA: Diagnosis not present

## 2017-10-30 DIAGNOSIS — L609 Nail disorder, unspecified: Secondary | ICD-10-CM | POA: Diagnosis not present

## 2017-10-30 DIAGNOSIS — E1140 Type 2 diabetes mellitus with diabetic neuropathy, unspecified: Secondary | ICD-10-CM | POA: Diagnosis not present

## 2017-11-20 DIAGNOSIS — E113413 Type 2 diabetes mellitus with severe nonproliferative diabetic retinopathy with macular edema, bilateral: Secondary | ICD-10-CM | POA: Diagnosis not present

## 2017-12-17 ENCOUNTER — Other Ambulatory Visit: Payer: Self-pay | Admitting: Cardiology

## 2017-12-25 DIAGNOSIS — H3582 Retinal ischemia: Secondary | ICD-10-CM | POA: Diagnosis not present

## 2017-12-25 DIAGNOSIS — E113413 Type 2 diabetes mellitus with severe nonproliferative diabetic retinopathy with macular edema, bilateral: Secondary | ICD-10-CM | POA: Diagnosis not present

## 2017-12-25 DIAGNOSIS — H35033 Hypertensive retinopathy, bilateral: Secondary | ICD-10-CM | POA: Diagnosis not present

## 2017-12-25 DIAGNOSIS — H2513 Age-related nuclear cataract, bilateral: Secondary | ICD-10-CM | POA: Diagnosis not present

## 2018-01-15 DIAGNOSIS — E1140 Type 2 diabetes mellitus with diabetic neuropathy, unspecified: Secondary | ICD-10-CM | POA: Diagnosis not present

## 2018-01-15 DIAGNOSIS — L609 Nail disorder, unspecified: Secondary | ICD-10-CM | POA: Diagnosis not present

## 2018-01-26 DIAGNOSIS — Z7984 Long term (current) use of oral hypoglycemic drugs: Secondary | ICD-10-CM | POA: Diagnosis not present

## 2018-01-26 DIAGNOSIS — E1165 Type 2 diabetes mellitus with hyperglycemia: Secondary | ICD-10-CM | POA: Diagnosis not present

## 2018-01-26 DIAGNOSIS — E113313 Type 2 diabetes mellitus with moderate nonproliferative diabetic retinopathy with macular edema, bilateral: Secondary | ICD-10-CM | POA: Diagnosis not present

## 2018-01-26 DIAGNOSIS — H25813 Combined forms of age-related cataract, bilateral: Secondary | ICD-10-CM | POA: Diagnosis not present

## 2018-01-29 DIAGNOSIS — H3582 Retinal ischemia: Secondary | ICD-10-CM | POA: Diagnosis not present

## 2018-01-29 DIAGNOSIS — H2513 Age-related nuclear cataract, bilateral: Secondary | ICD-10-CM | POA: Diagnosis not present

## 2018-01-29 DIAGNOSIS — E113413 Type 2 diabetes mellitus with severe nonproliferative diabetic retinopathy with macular edema, bilateral: Secondary | ICD-10-CM | POA: Diagnosis not present

## 2018-01-29 DIAGNOSIS — H35033 Hypertensive retinopathy, bilateral: Secondary | ICD-10-CM | POA: Diagnosis not present

## 2018-02-26 DIAGNOSIS — E113413 Type 2 diabetes mellitus with severe nonproliferative diabetic retinopathy with macular edema, bilateral: Secondary | ICD-10-CM | POA: Diagnosis not present

## 2018-02-26 DIAGNOSIS — H2513 Age-related nuclear cataract, bilateral: Secondary | ICD-10-CM | POA: Diagnosis not present

## 2018-02-26 DIAGNOSIS — H3582 Retinal ischemia: Secondary | ICD-10-CM | POA: Diagnosis not present

## 2018-02-26 DIAGNOSIS — H35033 Hypertensive retinopathy, bilateral: Secondary | ICD-10-CM | POA: Diagnosis not present

## 2018-03-26 DIAGNOSIS — E113411 Type 2 diabetes mellitus with severe nonproliferative diabetic retinopathy with macular edema, right eye: Secondary | ICD-10-CM | POA: Diagnosis not present

## 2018-04-02 DIAGNOSIS — L609 Nail disorder, unspecified: Secondary | ICD-10-CM | POA: Diagnosis not present

## 2018-04-02 DIAGNOSIS — E1140 Type 2 diabetes mellitus with diabetic neuropathy, unspecified: Secondary | ICD-10-CM | POA: Diagnosis not present

## 2018-04-09 DIAGNOSIS — H2513 Age-related nuclear cataract, bilateral: Secondary | ICD-10-CM | POA: Diagnosis not present

## 2018-04-09 DIAGNOSIS — E113313 Type 2 diabetes mellitus with moderate nonproliferative diabetic retinopathy with macular edema, bilateral: Secondary | ICD-10-CM | POA: Diagnosis not present

## 2018-04-09 DIAGNOSIS — H3582 Retinal ischemia: Secondary | ICD-10-CM | POA: Diagnosis not present

## 2018-04-09 DIAGNOSIS — H35033 Hypertensive retinopathy, bilateral: Secondary | ICD-10-CM | POA: Diagnosis not present

## 2018-05-15 DIAGNOSIS — Z6827 Body mass index (BMI) 27.0-27.9, adult: Secondary | ICD-10-CM | POA: Diagnosis not present

## 2018-05-15 DIAGNOSIS — J209 Acute bronchitis, unspecified: Secondary | ICD-10-CM | POA: Diagnosis not present

## 2018-05-18 DIAGNOSIS — G4733 Obstructive sleep apnea (adult) (pediatric): Secondary | ICD-10-CM | POA: Diagnosis not present

## 2018-05-18 DIAGNOSIS — E1165 Type 2 diabetes mellitus with hyperglycemia: Secondary | ICD-10-CM | POA: Diagnosis not present

## 2018-05-18 DIAGNOSIS — Z6826 Body mass index (BMI) 26.0-26.9, adult: Secondary | ICD-10-CM | POA: Diagnosis not present

## 2018-05-18 DIAGNOSIS — E7800 Pure hypercholesterolemia, unspecified: Secondary | ICD-10-CM | POA: Diagnosis not present

## 2018-05-18 DIAGNOSIS — Z23 Encounter for immunization: Secondary | ICD-10-CM | POA: Diagnosis not present

## 2018-05-18 DIAGNOSIS — I10 Essential (primary) hypertension: Secondary | ICD-10-CM | POA: Diagnosis not present

## 2018-05-21 DIAGNOSIS — E113313 Type 2 diabetes mellitus with moderate nonproliferative diabetic retinopathy with macular edema, bilateral: Secondary | ICD-10-CM | POA: Diagnosis not present

## 2018-06-25 DIAGNOSIS — L609 Nail disorder, unspecified: Secondary | ICD-10-CM | POA: Diagnosis not present

## 2018-06-25 DIAGNOSIS — H2513 Age-related nuclear cataract, bilateral: Secondary | ICD-10-CM | POA: Diagnosis not present

## 2018-06-25 DIAGNOSIS — E1140 Type 2 diabetes mellitus with diabetic neuropathy, unspecified: Secondary | ICD-10-CM | POA: Diagnosis not present

## 2018-06-25 DIAGNOSIS — E113313 Type 2 diabetes mellitus with moderate nonproliferative diabetic retinopathy with macular edema, bilateral: Secondary | ICD-10-CM | POA: Diagnosis not present

## 2018-06-25 DIAGNOSIS — H35033 Hypertensive retinopathy, bilateral: Secondary | ICD-10-CM | POA: Diagnosis not present

## 2018-06-25 DIAGNOSIS — H3582 Retinal ischemia: Secondary | ICD-10-CM | POA: Diagnosis not present

## 2018-06-30 ENCOUNTER — Other Ambulatory Visit: Payer: Self-pay | Admitting: Cardiology

## 2018-07-09 ENCOUNTER — Other Ambulatory Visit: Payer: Self-pay | Admitting: Cardiology

## 2018-07-09 NOTE — Telephone Encounter (Signed)
°*  STAT* If patient is at the pharmacy, call can be transferred to refill team.   1. Which medications need to be refilled? (please list name of each medication and dose if known) Xarelto  2. Which pharmacy/location (including street and city if local pharmacy) is medication to be sent to? [facility] [phone]  3. Do they need a 30 day or 90 day supply? 90 and refills

## 2018-07-10 MED ORDER — RIVAROXABAN 20 MG PO TABS: ORAL_TABLET | ORAL | 0 refills | Status: DC

## 2018-07-30 DIAGNOSIS — H2513 Age-related nuclear cataract, bilateral: Secondary | ICD-10-CM | POA: Diagnosis not present

## 2018-07-30 DIAGNOSIS — H3582 Retinal ischemia: Secondary | ICD-10-CM | POA: Diagnosis not present

## 2018-07-30 DIAGNOSIS — H35033 Hypertensive retinopathy, bilateral: Secondary | ICD-10-CM | POA: Diagnosis not present

## 2018-07-30 DIAGNOSIS — E113313 Type 2 diabetes mellitus with moderate nonproliferative diabetic retinopathy with macular edema, bilateral: Secondary | ICD-10-CM | POA: Diagnosis not present

## 2018-09-13 DIAGNOSIS — E113313 Type 2 diabetes mellitus with moderate nonproliferative diabetic retinopathy with macular edema, bilateral: Secondary | ICD-10-CM | POA: Diagnosis not present

## 2018-09-13 DIAGNOSIS — H3582 Retinal ischemia: Secondary | ICD-10-CM | POA: Diagnosis not present

## 2018-09-13 DIAGNOSIS — H35033 Hypertensive retinopathy, bilateral: Secondary | ICD-10-CM | POA: Diagnosis not present

## 2018-09-17 DIAGNOSIS — E1140 Type 2 diabetes mellitus with diabetic neuropathy, unspecified: Secondary | ICD-10-CM | POA: Diagnosis not present

## 2018-09-17 DIAGNOSIS — L609 Nail disorder, unspecified: Secondary | ICD-10-CM | POA: Diagnosis not present

## 2018-10-05 DIAGNOSIS — R05 Cough: Secondary | ICD-10-CM | POA: Diagnosis not present

## 2018-10-05 DIAGNOSIS — Z6827 Body mass index (BMI) 27.0-27.9, adult: Secondary | ICD-10-CM | POA: Diagnosis not present

## 2018-10-05 DIAGNOSIS — J101 Influenza due to other identified influenza virus with other respiratory manifestations: Secondary | ICD-10-CM | POA: Diagnosis not present

## 2018-10-05 DIAGNOSIS — R509 Fever, unspecified: Secondary | ICD-10-CM | POA: Diagnosis not present

## 2018-10-15 DIAGNOSIS — M5442 Lumbago with sciatica, left side: Secondary | ICD-10-CM | POA: Diagnosis not present

## 2018-10-15 DIAGNOSIS — S335XXA Sprain of ligaments of lumbar spine, initial encounter: Secondary | ICD-10-CM | POA: Diagnosis not present

## 2018-10-15 DIAGNOSIS — M9903 Segmental and somatic dysfunction of lumbar region: Secondary | ICD-10-CM | POA: Diagnosis not present

## 2018-10-15 DIAGNOSIS — M9902 Segmental and somatic dysfunction of thoracic region: Secondary | ICD-10-CM | POA: Diagnosis not present

## 2018-10-17 DIAGNOSIS — M9903 Segmental and somatic dysfunction of lumbar region: Secondary | ICD-10-CM | POA: Diagnosis not present

## 2018-10-17 DIAGNOSIS — M5442 Lumbago with sciatica, left side: Secondary | ICD-10-CM | POA: Diagnosis not present

## 2018-10-17 DIAGNOSIS — M9902 Segmental and somatic dysfunction of thoracic region: Secondary | ICD-10-CM | POA: Diagnosis not present

## 2018-10-17 DIAGNOSIS — S335XXA Sprain of ligaments of lumbar spine, initial encounter: Secondary | ICD-10-CM | POA: Diagnosis not present

## 2018-10-24 DIAGNOSIS — M9902 Segmental and somatic dysfunction of thoracic region: Secondary | ICD-10-CM | POA: Diagnosis not present

## 2018-10-24 DIAGNOSIS — M5442 Lumbago with sciatica, left side: Secondary | ICD-10-CM | POA: Diagnosis not present

## 2018-10-24 DIAGNOSIS — S335XXA Sprain of ligaments of lumbar spine, initial encounter: Secondary | ICD-10-CM | POA: Diagnosis not present

## 2018-10-24 DIAGNOSIS — M9903 Segmental and somatic dysfunction of lumbar region: Secondary | ICD-10-CM | POA: Diagnosis not present

## 2018-10-29 DIAGNOSIS — Z6827 Body mass index (BMI) 27.0-27.9, adult: Secondary | ICD-10-CM | POA: Diagnosis not present

## 2018-10-29 DIAGNOSIS — J209 Acute bronchitis, unspecified: Secondary | ICD-10-CM | POA: Diagnosis not present

## 2018-11-07 DIAGNOSIS — I10 Essential (primary) hypertension: Secondary | ICD-10-CM | POA: Diagnosis not present

## 2018-11-07 DIAGNOSIS — D519 Vitamin B12 deficiency anemia, unspecified: Secondary | ICD-10-CM | POA: Diagnosis not present

## 2018-11-07 DIAGNOSIS — G4733 Obstructive sleep apnea (adult) (pediatric): Secondary | ICD-10-CM | POA: Diagnosis not present

## 2018-11-07 DIAGNOSIS — E1165 Type 2 diabetes mellitus with hyperglycemia: Secondary | ICD-10-CM | POA: Diagnosis not present

## 2018-11-07 DIAGNOSIS — R269 Unspecified abnormalities of gait and mobility: Secondary | ICD-10-CM | POA: Diagnosis not present

## 2018-11-07 DIAGNOSIS — E7800 Pure hypercholesterolemia, unspecified: Secondary | ICD-10-CM | POA: Diagnosis not present

## 2018-11-09 DIAGNOSIS — Z0001 Encounter for general adult medical examination with abnormal findings: Secondary | ICD-10-CM | POA: Diagnosis not present

## 2018-11-09 DIAGNOSIS — I10 Essential (primary) hypertension: Secondary | ICD-10-CM | POA: Diagnosis not present

## 2018-11-09 DIAGNOSIS — Z6826 Body mass index (BMI) 26.0-26.9, adult: Secondary | ICD-10-CM | POA: Diagnosis not present

## 2018-11-16 DIAGNOSIS — E113411 Type 2 diabetes mellitus with severe nonproliferative diabetic retinopathy with macular edema, right eye: Secondary | ICD-10-CM | POA: Diagnosis not present

## 2018-11-16 DIAGNOSIS — E113512 Type 2 diabetes mellitus with proliferative diabetic retinopathy with macular edema, left eye: Secondary | ICD-10-CM | POA: Diagnosis not present

## 2018-11-16 DIAGNOSIS — H3582 Retinal ischemia: Secondary | ICD-10-CM | POA: Diagnosis not present

## 2018-11-16 DIAGNOSIS — H35033 Hypertensive retinopathy, bilateral: Secondary | ICD-10-CM | POA: Diagnosis not present

## 2018-12-03 DIAGNOSIS — E1140 Type 2 diabetes mellitus with diabetic neuropathy, unspecified: Secondary | ICD-10-CM | POA: Diagnosis not present

## 2018-12-03 DIAGNOSIS — L609 Nail disorder, unspecified: Secondary | ICD-10-CM | POA: Diagnosis not present

## 2019-01-08 DIAGNOSIS — E113512 Type 2 diabetes mellitus with proliferative diabetic retinopathy with macular edema, left eye: Secondary | ICD-10-CM | POA: Diagnosis not present

## 2019-01-08 DIAGNOSIS — E113411 Type 2 diabetes mellitus with severe nonproliferative diabetic retinopathy with macular edema, right eye: Secondary | ICD-10-CM | POA: Diagnosis not present

## 2019-02-07 DIAGNOSIS — H18413 Arcus senilis, bilateral: Secondary | ICD-10-CM | POA: Diagnosis not present

## 2019-02-07 DIAGNOSIS — H25013 Cortical age-related cataract, bilateral: Secondary | ICD-10-CM | POA: Diagnosis not present

## 2019-02-07 DIAGNOSIS — H25043 Posterior subcapsular polar age-related cataract, bilateral: Secondary | ICD-10-CM | POA: Diagnosis not present

## 2019-02-07 DIAGNOSIS — H2513 Age-related nuclear cataract, bilateral: Secondary | ICD-10-CM | POA: Diagnosis not present

## 2019-02-07 DIAGNOSIS — E103393 Type 1 diabetes mellitus with moderate nonproliferative diabetic retinopathy without macular edema, bilateral: Secondary | ICD-10-CM | POA: Diagnosis not present

## 2019-02-07 DIAGNOSIS — H2511 Age-related nuclear cataract, right eye: Secondary | ICD-10-CM | POA: Diagnosis not present

## 2019-02-25 DIAGNOSIS — L609 Nail disorder, unspecified: Secondary | ICD-10-CM | POA: Diagnosis not present

## 2019-02-25 DIAGNOSIS — E1140 Type 2 diabetes mellitus with diabetic neuropathy, unspecified: Secondary | ICD-10-CM | POA: Diagnosis not present

## 2019-03-06 DIAGNOSIS — H2511 Age-related nuclear cataract, right eye: Secondary | ICD-10-CM | POA: Diagnosis not present

## 2019-03-07 DIAGNOSIS — H2512 Age-related nuclear cataract, left eye: Secondary | ICD-10-CM | POA: Diagnosis not present

## 2019-03-20 DIAGNOSIS — H2512 Age-related nuclear cataract, left eye: Secondary | ICD-10-CM | POA: Diagnosis not present

## 2019-03-23 DIAGNOSIS — L209 Atopic dermatitis, unspecified: Secondary | ICD-10-CM | POA: Diagnosis not present

## 2019-03-23 DIAGNOSIS — Z6826 Body mass index (BMI) 26.0-26.9, adult: Secondary | ICD-10-CM | POA: Diagnosis not present

## 2019-04-04 DIAGNOSIS — E113313 Type 2 diabetes mellitus with moderate nonproliferative diabetic retinopathy with macular edema, bilateral: Secondary | ICD-10-CM | POA: Diagnosis not present

## 2019-04-04 DIAGNOSIS — H35033 Hypertensive retinopathy, bilateral: Secondary | ICD-10-CM | POA: Diagnosis not present

## 2019-04-04 DIAGNOSIS — H3582 Retinal ischemia: Secondary | ICD-10-CM | POA: Diagnosis not present

## 2019-04-12 DIAGNOSIS — E119 Type 2 diabetes mellitus without complications: Secondary | ICD-10-CM | POA: Diagnosis not present

## 2019-04-12 DIAGNOSIS — I10 Essential (primary) hypertension: Secondary | ICD-10-CM | POA: Diagnosis not present

## 2019-04-22 DIAGNOSIS — L719 Rosacea, unspecified: Secondary | ICD-10-CM | POA: Diagnosis not present

## 2019-04-22 DIAGNOSIS — Z1389 Encounter for screening for other disorder: Secondary | ICD-10-CM | POA: Diagnosis not present

## 2019-04-22 DIAGNOSIS — E1165 Type 2 diabetes mellitus with hyperglycemia: Secondary | ICD-10-CM | POA: Diagnosis not present

## 2019-04-22 DIAGNOSIS — L209 Atopic dermatitis, unspecified: Secondary | ICD-10-CM | POA: Diagnosis not present

## 2019-04-22 DIAGNOSIS — I10 Essential (primary) hypertension: Secondary | ICD-10-CM | POA: Diagnosis not present

## 2019-04-22 DIAGNOSIS — Z6827 Body mass index (BMI) 27.0-27.9, adult: Secondary | ICD-10-CM | POA: Diagnosis not present

## 2019-04-22 DIAGNOSIS — Z1331 Encounter for screening for depression: Secondary | ICD-10-CM | POA: Diagnosis not present

## 2019-04-22 DIAGNOSIS — G4733 Obstructive sleep apnea (adult) (pediatric): Secondary | ICD-10-CM | POA: Diagnosis not present

## 2019-05-13 DIAGNOSIS — I10 Essential (primary) hypertension: Secondary | ICD-10-CM | POA: Diagnosis not present

## 2019-05-13 DIAGNOSIS — E1140 Type 2 diabetes mellitus with diabetic neuropathy, unspecified: Secondary | ICD-10-CM | POA: Diagnosis not present

## 2019-05-13 DIAGNOSIS — E7800 Pure hypercholesterolemia, unspecified: Secondary | ICD-10-CM | POA: Diagnosis not present

## 2019-05-13 DIAGNOSIS — H35033 Hypertensive retinopathy, bilateral: Secondary | ICD-10-CM | POA: Diagnosis not present

## 2019-05-13 DIAGNOSIS — L609 Nail disorder, unspecified: Secondary | ICD-10-CM | POA: Diagnosis not present

## 2019-05-13 DIAGNOSIS — E1165 Type 2 diabetes mellitus with hyperglycemia: Secondary | ICD-10-CM | POA: Diagnosis not present

## 2019-05-13 DIAGNOSIS — H3582 Retinal ischemia: Secondary | ICD-10-CM | POA: Diagnosis not present

## 2019-05-13 DIAGNOSIS — E113313 Type 2 diabetes mellitus with moderate nonproliferative diabetic retinopathy with macular edema, bilateral: Secondary | ICD-10-CM | POA: Diagnosis not present

## 2019-05-16 DIAGNOSIS — S338XXA Sprain of other parts of lumbar spine and pelvis, initial encounter: Secondary | ICD-10-CM | POA: Diagnosis not present

## 2019-05-16 DIAGNOSIS — S134XXA Sprain of ligaments of cervical spine, initial encounter: Secondary | ICD-10-CM | POA: Diagnosis not present

## 2019-05-16 DIAGNOSIS — M9903 Segmental and somatic dysfunction of lumbar region: Secondary | ICD-10-CM | POA: Diagnosis not present

## 2019-05-16 DIAGNOSIS — S233XXA Sprain of ligaments of thoracic spine, initial encounter: Secondary | ICD-10-CM | POA: Diagnosis not present

## 2019-05-16 DIAGNOSIS — M9902 Segmental and somatic dysfunction of thoracic region: Secondary | ICD-10-CM | POA: Diagnosis not present

## 2019-05-16 DIAGNOSIS — M9901 Segmental and somatic dysfunction of cervical region: Secondary | ICD-10-CM | POA: Diagnosis not present

## 2019-05-23 DIAGNOSIS — S233XXA Sprain of ligaments of thoracic spine, initial encounter: Secondary | ICD-10-CM | POA: Diagnosis not present

## 2019-05-23 DIAGNOSIS — M9901 Segmental and somatic dysfunction of cervical region: Secondary | ICD-10-CM | POA: Diagnosis not present

## 2019-05-23 DIAGNOSIS — S134XXA Sprain of ligaments of cervical spine, initial encounter: Secondary | ICD-10-CM | POA: Diagnosis not present

## 2019-05-23 DIAGNOSIS — S338XXA Sprain of other parts of lumbar spine and pelvis, initial encounter: Secondary | ICD-10-CM | POA: Diagnosis not present

## 2019-05-23 DIAGNOSIS — M9902 Segmental and somatic dysfunction of thoracic region: Secondary | ICD-10-CM | POA: Diagnosis not present

## 2019-05-23 DIAGNOSIS — M9903 Segmental and somatic dysfunction of lumbar region: Secondary | ICD-10-CM | POA: Diagnosis not present

## 2019-05-27 DIAGNOSIS — M9902 Segmental and somatic dysfunction of thoracic region: Secondary | ICD-10-CM | POA: Diagnosis not present

## 2019-05-27 DIAGNOSIS — S338XXA Sprain of other parts of lumbar spine and pelvis, initial encounter: Secondary | ICD-10-CM | POA: Diagnosis not present

## 2019-05-27 DIAGNOSIS — M9903 Segmental and somatic dysfunction of lumbar region: Secondary | ICD-10-CM | POA: Diagnosis not present

## 2019-05-27 DIAGNOSIS — M9901 Segmental and somatic dysfunction of cervical region: Secondary | ICD-10-CM | POA: Diagnosis not present

## 2019-05-27 DIAGNOSIS — S233XXA Sprain of ligaments of thoracic spine, initial encounter: Secondary | ICD-10-CM | POA: Diagnosis not present

## 2019-05-27 DIAGNOSIS — S134XXA Sprain of ligaments of cervical spine, initial encounter: Secondary | ICD-10-CM | POA: Diagnosis not present

## 2019-05-30 DIAGNOSIS — M9901 Segmental and somatic dysfunction of cervical region: Secondary | ICD-10-CM | POA: Diagnosis not present

## 2019-05-30 DIAGNOSIS — S338XXA Sprain of other parts of lumbar spine and pelvis, initial encounter: Secondary | ICD-10-CM | POA: Diagnosis not present

## 2019-05-30 DIAGNOSIS — S233XXA Sprain of ligaments of thoracic spine, initial encounter: Secondary | ICD-10-CM | POA: Diagnosis not present

## 2019-05-30 DIAGNOSIS — S134XXA Sprain of ligaments of cervical spine, initial encounter: Secondary | ICD-10-CM | POA: Diagnosis not present

## 2019-05-30 DIAGNOSIS — M9903 Segmental and somatic dysfunction of lumbar region: Secondary | ICD-10-CM | POA: Diagnosis not present

## 2019-05-30 DIAGNOSIS — M9902 Segmental and somatic dysfunction of thoracic region: Secondary | ICD-10-CM | POA: Diagnosis not present

## 2019-05-31 DIAGNOSIS — Z23 Encounter for immunization: Secondary | ICD-10-CM | POA: Diagnosis not present

## 2019-06-03 DIAGNOSIS — S134XXA Sprain of ligaments of cervical spine, initial encounter: Secondary | ICD-10-CM | POA: Diagnosis not present

## 2019-06-03 DIAGNOSIS — S233XXA Sprain of ligaments of thoracic spine, initial encounter: Secondary | ICD-10-CM | POA: Diagnosis not present

## 2019-06-03 DIAGNOSIS — M9901 Segmental and somatic dysfunction of cervical region: Secondary | ICD-10-CM | POA: Diagnosis not present

## 2019-06-03 DIAGNOSIS — S338XXA Sprain of other parts of lumbar spine and pelvis, initial encounter: Secondary | ICD-10-CM | POA: Diagnosis not present

## 2019-06-03 DIAGNOSIS — M9903 Segmental and somatic dysfunction of lumbar region: Secondary | ICD-10-CM | POA: Diagnosis not present

## 2019-06-03 DIAGNOSIS — M9902 Segmental and somatic dysfunction of thoracic region: Secondary | ICD-10-CM | POA: Diagnosis not present

## 2019-06-06 DIAGNOSIS — S338XXA Sprain of other parts of lumbar spine and pelvis, initial encounter: Secondary | ICD-10-CM | POA: Diagnosis not present

## 2019-06-06 DIAGNOSIS — S134XXA Sprain of ligaments of cervical spine, initial encounter: Secondary | ICD-10-CM | POA: Diagnosis not present

## 2019-06-06 DIAGNOSIS — M9901 Segmental and somatic dysfunction of cervical region: Secondary | ICD-10-CM | POA: Diagnosis not present

## 2019-06-06 DIAGNOSIS — M9903 Segmental and somatic dysfunction of lumbar region: Secondary | ICD-10-CM | POA: Diagnosis not present

## 2019-06-06 DIAGNOSIS — M9902 Segmental and somatic dysfunction of thoracic region: Secondary | ICD-10-CM | POA: Diagnosis not present

## 2019-06-06 DIAGNOSIS — S233XXA Sprain of ligaments of thoracic spine, initial encounter: Secondary | ICD-10-CM | POA: Diagnosis not present

## 2019-06-12 DIAGNOSIS — I10 Essential (primary) hypertension: Secondary | ICD-10-CM | POA: Diagnosis not present

## 2019-06-12 DIAGNOSIS — E7800 Pure hypercholesterolemia, unspecified: Secondary | ICD-10-CM | POA: Diagnosis not present

## 2019-06-12 DIAGNOSIS — M1711 Unilateral primary osteoarthritis, right knee: Secondary | ICD-10-CM | POA: Diagnosis not present

## 2019-06-13 DIAGNOSIS — M9902 Segmental and somatic dysfunction of thoracic region: Secondary | ICD-10-CM | POA: Diagnosis not present

## 2019-06-13 DIAGNOSIS — S134XXA Sprain of ligaments of cervical spine, initial encounter: Secondary | ICD-10-CM | POA: Diagnosis not present

## 2019-06-13 DIAGNOSIS — M9903 Segmental and somatic dysfunction of lumbar region: Secondary | ICD-10-CM | POA: Diagnosis not present

## 2019-06-13 DIAGNOSIS — M9901 Segmental and somatic dysfunction of cervical region: Secondary | ICD-10-CM | POA: Diagnosis not present

## 2019-06-13 DIAGNOSIS — S233XXA Sprain of ligaments of thoracic spine, initial encounter: Secondary | ICD-10-CM | POA: Diagnosis not present

## 2019-06-13 DIAGNOSIS — S338XXA Sprain of other parts of lumbar spine and pelvis, initial encounter: Secondary | ICD-10-CM | POA: Diagnosis not present

## 2019-06-20 DIAGNOSIS — M9903 Segmental and somatic dysfunction of lumbar region: Secondary | ICD-10-CM | POA: Diagnosis not present

## 2019-06-20 DIAGNOSIS — M9902 Segmental and somatic dysfunction of thoracic region: Secondary | ICD-10-CM | POA: Diagnosis not present

## 2019-06-20 DIAGNOSIS — S233XXA Sprain of ligaments of thoracic spine, initial encounter: Secondary | ICD-10-CM | POA: Diagnosis not present

## 2019-06-20 DIAGNOSIS — S134XXA Sprain of ligaments of cervical spine, initial encounter: Secondary | ICD-10-CM | POA: Diagnosis not present

## 2019-06-20 DIAGNOSIS — S338XXA Sprain of other parts of lumbar spine and pelvis, initial encounter: Secondary | ICD-10-CM | POA: Diagnosis not present

## 2019-06-20 DIAGNOSIS — M9901 Segmental and somatic dysfunction of cervical region: Secondary | ICD-10-CM | POA: Diagnosis not present

## 2019-06-26 DIAGNOSIS — E113313 Type 2 diabetes mellitus with moderate nonproliferative diabetic retinopathy with macular edema, bilateral: Secondary | ICD-10-CM | POA: Diagnosis not present

## 2019-06-26 DIAGNOSIS — H3582 Retinal ischemia: Secondary | ICD-10-CM | POA: Diagnosis not present

## 2019-06-26 DIAGNOSIS — H35033 Hypertensive retinopathy, bilateral: Secondary | ICD-10-CM | POA: Diagnosis not present

## 2019-06-27 DIAGNOSIS — M9903 Segmental and somatic dysfunction of lumbar region: Secondary | ICD-10-CM | POA: Diagnosis not present

## 2019-06-27 DIAGNOSIS — S233XXA Sprain of ligaments of thoracic spine, initial encounter: Secondary | ICD-10-CM | POA: Diagnosis not present

## 2019-06-27 DIAGNOSIS — M9902 Segmental and somatic dysfunction of thoracic region: Secondary | ICD-10-CM | POA: Diagnosis not present

## 2019-06-27 DIAGNOSIS — M9901 Segmental and somatic dysfunction of cervical region: Secondary | ICD-10-CM | POA: Diagnosis not present

## 2019-06-27 DIAGNOSIS — S338XXA Sprain of other parts of lumbar spine and pelvis, initial encounter: Secondary | ICD-10-CM | POA: Diagnosis not present

## 2019-06-27 DIAGNOSIS — S134XXA Sprain of ligaments of cervical spine, initial encounter: Secondary | ICD-10-CM | POA: Diagnosis not present

## 2019-07-10 DIAGNOSIS — I10 Essential (primary) hypertension: Secondary | ICD-10-CM | POA: Diagnosis not present

## 2019-07-11 DIAGNOSIS — S134XXA Sprain of ligaments of cervical spine, initial encounter: Secondary | ICD-10-CM | POA: Diagnosis not present

## 2019-07-11 DIAGNOSIS — M9902 Segmental and somatic dysfunction of thoracic region: Secondary | ICD-10-CM | POA: Diagnosis not present

## 2019-07-11 DIAGNOSIS — S233XXA Sprain of ligaments of thoracic spine, initial encounter: Secondary | ICD-10-CM | POA: Diagnosis not present

## 2019-07-11 DIAGNOSIS — S338XXA Sprain of other parts of lumbar spine and pelvis, initial encounter: Secondary | ICD-10-CM | POA: Diagnosis not present

## 2019-07-11 DIAGNOSIS — M9901 Segmental and somatic dysfunction of cervical region: Secondary | ICD-10-CM | POA: Diagnosis not present

## 2019-07-11 DIAGNOSIS — M9903 Segmental and somatic dysfunction of lumbar region: Secondary | ICD-10-CM | POA: Diagnosis not present

## 2019-07-12 DIAGNOSIS — I10 Essential (primary) hypertension: Secondary | ICD-10-CM | POA: Diagnosis not present

## 2019-07-12 DIAGNOSIS — E782 Mixed hyperlipidemia: Secondary | ICD-10-CM | POA: Diagnosis not present

## 2019-07-17 DIAGNOSIS — E113311 Type 2 diabetes mellitus with moderate nonproliferative diabetic retinopathy with macular edema, right eye: Secondary | ICD-10-CM | POA: Diagnosis not present

## 2019-07-29 DIAGNOSIS — E1140 Type 2 diabetes mellitus with diabetic neuropathy, unspecified: Secondary | ICD-10-CM | POA: Diagnosis not present

## 2019-07-29 DIAGNOSIS — L609 Nail disorder, unspecified: Secondary | ICD-10-CM | POA: Diagnosis not present

## 2019-08-06 DIAGNOSIS — M9903 Segmental and somatic dysfunction of lumbar region: Secondary | ICD-10-CM | POA: Diagnosis not present

## 2019-08-06 DIAGNOSIS — S338XXA Sprain of other parts of lumbar spine and pelvis, initial encounter: Secondary | ICD-10-CM | POA: Diagnosis not present

## 2019-08-06 DIAGNOSIS — S134XXA Sprain of ligaments of cervical spine, initial encounter: Secondary | ICD-10-CM | POA: Diagnosis not present

## 2019-08-06 DIAGNOSIS — M9902 Segmental and somatic dysfunction of thoracic region: Secondary | ICD-10-CM | POA: Diagnosis not present

## 2019-08-06 DIAGNOSIS — M9901 Segmental and somatic dysfunction of cervical region: Secondary | ICD-10-CM | POA: Diagnosis not present

## 2019-08-06 DIAGNOSIS — S233XXA Sprain of ligaments of thoracic spine, initial encounter: Secondary | ICD-10-CM | POA: Diagnosis not present

## 2019-08-12 DIAGNOSIS — I10 Essential (primary) hypertension: Secondary | ICD-10-CM | POA: Diagnosis not present

## 2019-08-12 DIAGNOSIS — E113413 Type 2 diabetes mellitus with severe nonproliferative diabetic retinopathy with macular edema, bilateral: Secondary | ICD-10-CM | POA: Diagnosis not present

## 2019-08-21 DIAGNOSIS — Z6827 Body mass index (BMI) 27.0-27.9, adult: Secondary | ICD-10-CM | POA: Diagnosis not present

## 2019-08-21 DIAGNOSIS — E1165 Type 2 diabetes mellitus with hyperglycemia: Secondary | ICD-10-CM | POA: Diagnosis not present

## 2019-08-21 DIAGNOSIS — I10 Essential (primary) hypertension: Secondary | ICD-10-CM | POA: Diagnosis not present

## 2019-08-21 DIAGNOSIS — L309 Dermatitis, unspecified: Secondary | ICD-10-CM | POA: Diagnosis not present

## 2019-09-10 DIAGNOSIS — I10 Essential (primary) hypertension: Secondary | ICD-10-CM | POA: Diagnosis not present

## 2019-09-23 DIAGNOSIS — E113413 Type 2 diabetes mellitus with severe nonproliferative diabetic retinopathy with macular edema, bilateral: Secondary | ICD-10-CM | POA: Diagnosis not present

## 2019-10-01 DIAGNOSIS — S338XXA Sprain of other parts of lumbar spine and pelvis, initial encounter: Secondary | ICD-10-CM | POA: Diagnosis not present

## 2019-10-01 DIAGNOSIS — M9903 Segmental and somatic dysfunction of lumbar region: Secondary | ICD-10-CM | POA: Diagnosis not present

## 2019-10-01 DIAGNOSIS — M9902 Segmental and somatic dysfunction of thoracic region: Secondary | ICD-10-CM | POA: Diagnosis not present

## 2019-10-01 DIAGNOSIS — S134XXA Sprain of ligaments of cervical spine, initial encounter: Secondary | ICD-10-CM | POA: Diagnosis not present

## 2019-10-01 DIAGNOSIS — M9901 Segmental and somatic dysfunction of cervical region: Secondary | ICD-10-CM | POA: Diagnosis not present

## 2019-10-01 DIAGNOSIS — S233XXA Sprain of ligaments of thoracic spine, initial encounter: Secondary | ICD-10-CM | POA: Diagnosis not present

## 2019-10-03 DIAGNOSIS — L309 Dermatitis, unspecified: Secondary | ICD-10-CM | POA: Diagnosis not present

## 2019-10-03 DIAGNOSIS — E1165 Type 2 diabetes mellitus with hyperglycemia: Secondary | ICD-10-CM | POA: Diagnosis not present

## 2019-10-03 DIAGNOSIS — I10 Essential (primary) hypertension: Secondary | ICD-10-CM | POA: Diagnosis not present

## 2019-10-03 DIAGNOSIS — L821 Other seborrheic keratosis: Secondary | ICD-10-CM | POA: Diagnosis not present

## 2019-10-03 DIAGNOSIS — Z6827 Body mass index (BMI) 27.0-27.9, adult: Secondary | ICD-10-CM | POA: Diagnosis not present

## 2019-10-14 DIAGNOSIS — E1140 Type 2 diabetes mellitus with diabetic neuropathy, unspecified: Secondary | ICD-10-CM | POA: Diagnosis not present

## 2019-10-14 DIAGNOSIS — L609 Nail disorder, unspecified: Secondary | ICD-10-CM | POA: Diagnosis not present

## 2019-10-29 DIAGNOSIS — M9903 Segmental and somatic dysfunction of lumbar region: Secondary | ICD-10-CM | POA: Diagnosis not present

## 2019-10-29 DIAGNOSIS — S233XXA Sprain of ligaments of thoracic spine, initial encounter: Secondary | ICD-10-CM | POA: Diagnosis not present

## 2019-10-29 DIAGNOSIS — S134XXA Sprain of ligaments of cervical spine, initial encounter: Secondary | ICD-10-CM | POA: Diagnosis not present

## 2019-10-29 DIAGNOSIS — S338XXA Sprain of other parts of lumbar spine and pelvis, initial encounter: Secondary | ICD-10-CM | POA: Diagnosis not present

## 2019-10-29 DIAGNOSIS — M9901 Segmental and somatic dysfunction of cervical region: Secondary | ICD-10-CM | POA: Diagnosis not present

## 2019-10-29 DIAGNOSIS — M9902 Segmental and somatic dysfunction of thoracic region: Secondary | ICD-10-CM | POA: Diagnosis not present

## 2019-11-06 DIAGNOSIS — H3582 Retinal ischemia: Secondary | ICD-10-CM | POA: Diagnosis not present

## 2019-11-06 DIAGNOSIS — H35033 Hypertensive retinopathy, bilateral: Secondary | ICD-10-CM | POA: Diagnosis not present

## 2019-11-06 DIAGNOSIS — E113313 Type 2 diabetes mellitus with moderate nonproliferative diabetic retinopathy with macular edema, bilateral: Secondary | ICD-10-CM | POA: Diagnosis not present

## 2019-11-07 DIAGNOSIS — S338XXA Sprain of other parts of lumbar spine and pelvis, initial encounter: Secondary | ICD-10-CM | POA: Diagnosis not present

## 2019-11-07 DIAGNOSIS — E7849 Other hyperlipidemia: Secondary | ICD-10-CM | POA: Diagnosis not present

## 2019-11-07 DIAGNOSIS — M9902 Segmental and somatic dysfunction of thoracic region: Secondary | ICD-10-CM | POA: Diagnosis not present

## 2019-11-07 DIAGNOSIS — I10 Essential (primary) hypertension: Secondary | ICD-10-CM | POA: Diagnosis not present

## 2019-11-07 DIAGNOSIS — M9901 Segmental and somatic dysfunction of cervical region: Secondary | ICD-10-CM | POA: Diagnosis not present

## 2019-11-07 DIAGNOSIS — S233XXA Sprain of ligaments of thoracic spine, initial encounter: Secondary | ICD-10-CM | POA: Diagnosis not present

## 2019-11-07 DIAGNOSIS — M9903 Segmental and somatic dysfunction of lumbar region: Secondary | ICD-10-CM | POA: Diagnosis not present

## 2019-11-07 DIAGNOSIS — S134XXA Sprain of ligaments of cervical spine, initial encounter: Secondary | ICD-10-CM | POA: Diagnosis not present

## 2019-11-08 DIAGNOSIS — E7849 Other hyperlipidemia: Secondary | ICD-10-CM | POA: Diagnosis not present

## 2019-11-08 DIAGNOSIS — I10 Essential (primary) hypertension: Secondary | ICD-10-CM | POA: Diagnosis not present

## 2019-11-11 DIAGNOSIS — M9903 Segmental and somatic dysfunction of lumbar region: Secondary | ICD-10-CM | POA: Diagnosis not present

## 2019-11-11 DIAGNOSIS — M9902 Segmental and somatic dysfunction of thoracic region: Secondary | ICD-10-CM | POA: Diagnosis not present

## 2019-11-11 DIAGNOSIS — M9901 Segmental and somatic dysfunction of cervical region: Secondary | ICD-10-CM | POA: Diagnosis not present

## 2019-11-11 DIAGNOSIS — S134XXA Sprain of ligaments of cervical spine, initial encounter: Secondary | ICD-10-CM | POA: Diagnosis not present

## 2019-11-11 DIAGNOSIS — S338XXA Sprain of other parts of lumbar spine and pelvis, initial encounter: Secondary | ICD-10-CM | POA: Diagnosis not present

## 2019-11-11 DIAGNOSIS — S233XXA Sprain of ligaments of thoracic spine, initial encounter: Secondary | ICD-10-CM | POA: Diagnosis not present

## 2019-11-15 DIAGNOSIS — S134XXA Sprain of ligaments of cervical spine, initial encounter: Secondary | ICD-10-CM | POA: Diagnosis not present

## 2019-11-15 DIAGNOSIS — S338XXA Sprain of other parts of lumbar spine and pelvis, initial encounter: Secondary | ICD-10-CM | POA: Diagnosis not present

## 2019-11-15 DIAGNOSIS — S233XXA Sprain of ligaments of thoracic spine, initial encounter: Secondary | ICD-10-CM | POA: Diagnosis not present

## 2019-11-15 DIAGNOSIS — M9902 Segmental and somatic dysfunction of thoracic region: Secondary | ICD-10-CM | POA: Diagnosis not present

## 2019-11-15 DIAGNOSIS — M9903 Segmental and somatic dysfunction of lumbar region: Secondary | ICD-10-CM | POA: Diagnosis not present

## 2019-11-15 DIAGNOSIS — M9901 Segmental and somatic dysfunction of cervical region: Secondary | ICD-10-CM | POA: Diagnosis not present

## 2019-11-20 DIAGNOSIS — M9902 Segmental and somatic dysfunction of thoracic region: Secondary | ICD-10-CM | POA: Diagnosis not present

## 2019-11-20 DIAGNOSIS — M9903 Segmental and somatic dysfunction of lumbar region: Secondary | ICD-10-CM | POA: Diagnosis not present

## 2019-11-20 DIAGNOSIS — S134XXA Sprain of ligaments of cervical spine, initial encounter: Secondary | ICD-10-CM | POA: Diagnosis not present

## 2019-11-20 DIAGNOSIS — S338XXA Sprain of other parts of lumbar spine and pelvis, initial encounter: Secondary | ICD-10-CM | POA: Diagnosis not present

## 2019-11-20 DIAGNOSIS — S233XXA Sprain of ligaments of thoracic spine, initial encounter: Secondary | ICD-10-CM | POA: Diagnosis not present

## 2019-11-20 DIAGNOSIS — M9901 Segmental and somatic dysfunction of cervical region: Secondary | ICD-10-CM | POA: Diagnosis not present

## 2019-11-27 DIAGNOSIS — S233XXA Sprain of ligaments of thoracic spine, initial encounter: Secondary | ICD-10-CM | POA: Diagnosis not present

## 2019-11-27 DIAGNOSIS — S134XXA Sprain of ligaments of cervical spine, initial encounter: Secondary | ICD-10-CM | POA: Diagnosis not present

## 2019-11-27 DIAGNOSIS — M9902 Segmental and somatic dysfunction of thoracic region: Secondary | ICD-10-CM | POA: Diagnosis not present

## 2019-11-27 DIAGNOSIS — S338XXA Sprain of other parts of lumbar spine and pelvis, initial encounter: Secondary | ICD-10-CM | POA: Diagnosis not present

## 2019-11-27 DIAGNOSIS — M9903 Segmental and somatic dysfunction of lumbar region: Secondary | ICD-10-CM | POA: Diagnosis not present

## 2019-11-27 DIAGNOSIS — M9901 Segmental and somatic dysfunction of cervical region: Secondary | ICD-10-CM | POA: Diagnosis not present

## 2019-12-04 DIAGNOSIS — M9903 Segmental and somatic dysfunction of lumbar region: Secondary | ICD-10-CM | POA: Diagnosis not present

## 2019-12-04 DIAGNOSIS — S233XXA Sprain of ligaments of thoracic spine, initial encounter: Secondary | ICD-10-CM | POA: Diagnosis not present

## 2019-12-04 DIAGNOSIS — S338XXA Sprain of other parts of lumbar spine and pelvis, initial encounter: Secondary | ICD-10-CM | POA: Diagnosis not present

## 2019-12-04 DIAGNOSIS — M9901 Segmental and somatic dysfunction of cervical region: Secondary | ICD-10-CM | POA: Diagnosis not present

## 2019-12-04 DIAGNOSIS — S134XXA Sprain of ligaments of cervical spine, initial encounter: Secondary | ICD-10-CM | POA: Diagnosis not present

## 2019-12-04 DIAGNOSIS — M9902 Segmental and somatic dysfunction of thoracic region: Secondary | ICD-10-CM | POA: Diagnosis not present

## 2019-12-06 DIAGNOSIS — M1711 Unilateral primary osteoarthritis, right knee: Secondary | ICD-10-CM | POA: Diagnosis not present

## 2019-12-06 DIAGNOSIS — E1165 Type 2 diabetes mellitus with hyperglycemia: Secondary | ICD-10-CM | POA: Diagnosis not present

## 2019-12-06 DIAGNOSIS — T466X5A Adverse effect of antihyperlipidemic and antiarteriosclerotic drugs, initial encounter: Secondary | ICD-10-CM | POA: Diagnosis not present

## 2019-12-06 DIAGNOSIS — G720 Drug-induced myopathy: Secondary | ICD-10-CM | POA: Diagnosis not present

## 2019-12-06 DIAGNOSIS — Z6827 Body mass index (BMI) 27.0-27.9, adult: Secondary | ICD-10-CM | POA: Diagnosis not present

## 2019-12-10 DIAGNOSIS — E7849 Other hyperlipidemia: Secondary | ICD-10-CM | POA: Diagnosis not present

## 2019-12-10 DIAGNOSIS — I10 Essential (primary) hypertension: Secondary | ICD-10-CM | POA: Diagnosis not present

## 2019-12-11 DIAGNOSIS — I10 Essential (primary) hypertension: Secondary | ICD-10-CM | POA: Diagnosis not present

## 2019-12-11 DIAGNOSIS — S233XXA Sprain of ligaments of thoracic spine, initial encounter: Secondary | ICD-10-CM | POA: Diagnosis not present

## 2019-12-11 DIAGNOSIS — M9903 Segmental and somatic dysfunction of lumbar region: Secondary | ICD-10-CM | POA: Diagnosis not present

## 2019-12-11 DIAGNOSIS — E7849 Other hyperlipidemia: Secondary | ICD-10-CM | POA: Diagnosis not present

## 2019-12-11 DIAGNOSIS — M9902 Segmental and somatic dysfunction of thoracic region: Secondary | ICD-10-CM | POA: Diagnosis not present

## 2019-12-11 DIAGNOSIS — S338XXA Sprain of other parts of lumbar spine and pelvis, initial encounter: Secondary | ICD-10-CM | POA: Diagnosis not present

## 2019-12-11 DIAGNOSIS — S134XXA Sprain of ligaments of cervical spine, initial encounter: Secondary | ICD-10-CM | POA: Diagnosis not present

## 2019-12-11 DIAGNOSIS — M9901 Segmental and somatic dysfunction of cervical region: Secondary | ICD-10-CM | POA: Diagnosis not present

## 2019-12-17 DIAGNOSIS — M9903 Segmental and somatic dysfunction of lumbar region: Secondary | ICD-10-CM | POA: Diagnosis not present

## 2019-12-17 DIAGNOSIS — M9901 Segmental and somatic dysfunction of cervical region: Secondary | ICD-10-CM | POA: Diagnosis not present

## 2019-12-17 DIAGNOSIS — M9902 Segmental and somatic dysfunction of thoracic region: Secondary | ICD-10-CM | POA: Diagnosis not present

## 2019-12-17 DIAGNOSIS — S134XXA Sprain of ligaments of cervical spine, initial encounter: Secondary | ICD-10-CM | POA: Diagnosis not present

## 2019-12-17 DIAGNOSIS — S338XXA Sprain of other parts of lumbar spine and pelvis, initial encounter: Secondary | ICD-10-CM | POA: Diagnosis not present

## 2019-12-17 DIAGNOSIS — S233XXA Sprain of ligaments of thoracic spine, initial encounter: Secondary | ICD-10-CM | POA: Diagnosis not present

## 2019-12-18 DIAGNOSIS — E113313 Type 2 diabetes mellitus with moderate nonproliferative diabetic retinopathy with macular edema, bilateral: Secondary | ICD-10-CM | POA: Diagnosis not present

## 2019-12-23 DIAGNOSIS — E1140 Type 2 diabetes mellitus with diabetic neuropathy, unspecified: Secondary | ICD-10-CM | POA: Diagnosis not present

## 2019-12-23 DIAGNOSIS — L609 Nail disorder, unspecified: Secondary | ICD-10-CM | POA: Diagnosis not present

## 2019-12-24 DIAGNOSIS — S233XXA Sprain of ligaments of thoracic spine, initial encounter: Secondary | ICD-10-CM | POA: Diagnosis not present

## 2019-12-24 DIAGNOSIS — S134XXA Sprain of ligaments of cervical spine, initial encounter: Secondary | ICD-10-CM | POA: Diagnosis not present

## 2019-12-24 DIAGNOSIS — M9903 Segmental and somatic dysfunction of lumbar region: Secondary | ICD-10-CM | POA: Diagnosis not present

## 2019-12-24 DIAGNOSIS — S338XXA Sprain of other parts of lumbar spine and pelvis, initial encounter: Secondary | ICD-10-CM | POA: Diagnosis not present

## 2019-12-24 DIAGNOSIS — M9902 Segmental and somatic dysfunction of thoracic region: Secondary | ICD-10-CM | POA: Diagnosis not present

## 2019-12-24 DIAGNOSIS — M9901 Segmental and somatic dysfunction of cervical region: Secondary | ICD-10-CM | POA: Diagnosis not present

## 2019-12-25 DIAGNOSIS — M1711 Unilateral primary osteoarthritis, right knee: Secondary | ICD-10-CM | POA: Diagnosis not present

## 2019-12-25 DIAGNOSIS — Z6827 Body mass index (BMI) 27.0-27.9, adult: Secondary | ICD-10-CM | POA: Diagnosis not present

## 2019-12-25 DIAGNOSIS — M222X1 Patellofemoral disorders, right knee: Secondary | ICD-10-CM | POA: Diagnosis not present

## 2019-12-25 DIAGNOSIS — G720 Drug-induced myopathy: Secondary | ICD-10-CM | POA: Diagnosis not present

## 2019-12-25 DIAGNOSIS — E1165 Type 2 diabetes mellitus with hyperglycemia: Secondary | ICD-10-CM | POA: Diagnosis not present

## 2020-01-02 DIAGNOSIS — S233XXA Sprain of ligaments of thoracic spine, initial encounter: Secondary | ICD-10-CM | POA: Diagnosis not present

## 2020-01-02 DIAGNOSIS — M9901 Segmental and somatic dysfunction of cervical region: Secondary | ICD-10-CM | POA: Diagnosis not present

## 2020-01-02 DIAGNOSIS — M9903 Segmental and somatic dysfunction of lumbar region: Secondary | ICD-10-CM | POA: Diagnosis not present

## 2020-01-02 DIAGNOSIS — S338XXA Sprain of other parts of lumbar spine and pelvis, initial encounter: Secondary | ICD-10-CM | POA: Diagnosis not present

## 2020-01-02 DIAGNOSIS — S134XXA Sprain of ligaments of cervical spine, initial encounter: Secondary | ICD-10-CM | POA: Diagnosis not present

## 2020-01-02 DIAGNOSIS — M9902 Segmental and somatic dysfunction of thoracic region: Secondary | ICD-10-CM | POA: Diagnosis not present

## 2020-01-09 DIAGNOSIS — M9903 Segmental and somatic dysfunction of lumbar region: Secondary | ICD-10-CM | POA: Diagnosis not present

## 2020-01-09 DIAGNOSIS — I10 Essential (primary) hypertension: Secondary | ICD-10-CM | POA: Diagnosis not present

## 2020-01-09 DIAGNOSIS — M9902 Segmental and somatic dysfunction of thoracic region: Secondary | ICD-10-CM | POA: Diagnosis not present

## 2020-01-09 DIAGNOSIS — Z0001 Encounter for general adult medical examination with abnormal findings: Secondary | ICD-10-CM | POA: Diagnosis not present

## 2020-01-09 DIAGNOSIS — R269 Unspecified abnormalities of gait and mobility: Secondary | ICD-10-CM | POA: Diagnosis not present

## 2020-01-09 DIAGNOSIS — S338XXA Sprain of other parts of lumbar spine and pelvis, initial encounter: Secondary | ICD-10-CM | POA: Diagnosis not present

## 2020-01-09 DIAGNOSIS — M9901 Segmental and somatic dysfunction of cervical region: Secondary | ICD-10-CM | POA: Diagnosis not present

## 2020-01-09 DIAGNOSIS — E7800 Pure hypercholesterolemia, unspecified: Secondary | ICD-10-CM | POA: Diagnosis not present

## 2020-01-09 DIAGNOSIS — Z6827 Body mass index (BMI) 27.0-27.9, adult: Secondary | ICD-10-CM | POA: Diagnosis not present

## 2020-01-09 DIAGNOSIS — S233XXA Sprain of ligaments of thoracic spine, initial encounter: Secondary | ICD-10-CM | POA: Diagnosis not present

## 2020-01-09 DIAGNOSIS — M1711 Unilateral primary osteoarthritis, right knee: Secondary | ICD-10-CM | POA: Diagnosis not present

## 2020-01-09 DIAGNOSIS — E1165 Type 2 diabetes mellitus with hyperglycemia: Secondary | ICD-10-CM | POA: Diagnosis not present

## 2020-01-09 DIAGNOSIS — M1712 Unilateral primary osteoarthritis, left knee: Secondary | ICD-10-CM | POA: Diagnosis not present

## 2020-01-09 DIAGNOSIS — S134XXA Sprain of ligaments of cervical spine, initial encounter: Secondary | ICD-10-CM | POA: Diagnosis not present

## 2020-01-10 DIAGNOSIS — E1165 Type 2 diabetes mellitus with hyperglycemia: Secondary | ICD-10-CM | POA: Diagnosis not present

## 2020-01-10 DIAGNOSIS — I10 Essential (primary) hypertension: Secondary | ICD-10-CM | POA: Diagnosis not present

## 2020-01-16 DIAGNOSIS — S233XXA Sprain of ligaments of thoracic spine, initial encounter: Secondary | ICD-10-CM | POA: Diagnosis not present

## 2020-01-16 DIAGNOSIS — S338XXA Sprain of other parts of lumbar spine and pelvis, initial encounter: Secondary | ICD-10-CM | POA: Diagnosis not present

## 2020-01-16 DIAGNOSIS — M9902 Segmental and somatic dysfunction of thoracic region: Secondary | ICD-10-CM | POA: Diagnosis not present

## 2020-01-16 DIAGNOSIS — M9903 Segmental and somatic dysfunction of lumbar region: Secondary | ICD-10-CM | POA: Diagnosis not present

## 2020-01-16 DIAGNOSIS — S134XXA Sprain of ligaments of cervical spine, initial encounter: Secondary | ICD-10-CM | POA: Diagnosis not present

## 2020-01-16 DIAGNOSIS — M9901 Segmental and somatic dysfunction of cervical region: Secondary | ICD-10-CM | POA: Diagnosis not present

## 2020-01-23 DIAGNOSIS — M9902 Segmental and somatic dysfunction of thoracic region: Secondary | ICD-10-CM | POA: Diagnosis not present

## 2020-01-23 DIAGNOSIS — M9903 Segmental and somatic dysfunction of lumbar region: Secondary | ICD-10-CM | POA: Diagnosis not present

## 2020-01-23 DIAGNOSIS — S233XXA Sprain of ligaments of thoracic spine, initial encounter: Secondary | ICD-10-CM | POA: Diagnosis not present

## 2020-01-23 DIAGNOSIS — S338XXA Sprain of other parts of lumbar spine and pelvis, initial encounter: Secondary | ICD-10-CM | POA: Diagnosis not present

## 2020-01-23 DIAGNOSIS — M9901 Segmental and somatic dysfunction of cervical region: Secondary | ICD-10-CM | POA: Diagnosis not present

## 2020-01-23 DIAGNOSIS — S134XXA Sprain of ligaments of cervical spine, initial encounter: Secondary | ICD-10-CM | POA: Diagnosis not present

## 2020-01-30 DIAGNOSIS — S338XXA Sprain of other parts of lumbar spine and pelvis, initial encounter: Secondary | ICD-10-CM | POA: Diagnosis not present

## 2020-01-30 DIAGNOSIS — M9902 Segmental and somatic dysfunction of thoracic region: Secondary | ICD-10-CM | POA: Diagnosis not present

## 2020-01-30 DIAGNOSIS — S134XXA Sprain of ligaments of cervical spine, initial encounter: Secondary | ICD-10-CM | POA: Diagnosis not present

## 2020-01-30 DIAGNOSIS — M9903 Segmental and somatic dysfunction of lumbar region: Secondary | ICD-10-CM | POA: Diagnosis not present

## 2020-01-30 DIAGNOSIS — M9901 Segmental and somatic dysfunction of cervical region: Secondary | ICD-10-CM | POA: Diagnosis not present

## 2020-01-30 DIAGNOSIS — S233XXA Sprain of ligaments of thoracic spine, initial encounter: Secondary | ICD-10-CM | POA: Diagnosis not present

## 2020-02-02 DIAGNOSIS — H35033 Hypertensive retinopathy, bilateral: Secondary | ICD-10-CM | POA: Diagnosis not present

## 2020-02-02 DIAGNOSIS — E113313 Type 2 diabetes mellitus with moderate nonproliferative diabetic retinopathy with macular edema, bilateral: Secondary | ICD-10-CM | POA: Diagnosis not present

## 2020-02-02 DIAGNOSIS — H3582 Retinal ischemia: Secondary | ICD-10-CM | POA: Diagnosis not present

## 2020-02-10 DIAGNOSIS — I10 Essential (primary) hypertension: Secondary | ICD-10-CM | POA: Diagnosis not present

## 2020-02-10 DIAGNOSIS — E1165 Type 2 diabetes mellitus with hyperglycemia: Secondary | ICD-10-CM | POA: Diagnosis not present

## 2020-02-10 DIAGNOSIS — M170 Bilateral primary osteoarthritis of knee: Secondary | ICD-10-CM | POA: Diagnosis not present

## 2020-02-10 DIAGNOSIS — Z7984 Long term (current) use of oral hypoglycemic drugs: Secondary | ICD-10-CM | POA: Diagnosis not present

## 2020-03-02 DIAGNOSIS — L609 Nail disorder, unspecified: Secondary | ICD-10-CM | POA: Diagnosis not present

## 2020-03-02 DIAGNOSIS — E1140 Type 2 diabetes mellitus with diabetic neuropathy, unspecified: Secondary | ICD-10-CM | POA: Diagnosis not present

## 2020-03-11 DIAGNOSIS — I10 Essential (primary) hypertension: Secondary | ICD-10-CM | POA: Diagnosis not present

## 2020-03-11 DIAGNOSIS — E1165 Type 2 diabetes mellitus with hyperglycemia: Secondary | ICD-10-CM | POA: Diagnosis not present

## 2020-03-11 DIAGNOSIS — Z7984 Long term (current) use of oral hypoglycemic drugs: Secondary | ICD-10-CM | POA: Diagnosis not present

## 2020-03-11 DIAGNOSIS — M170 Bilateral primary osteoarthritis of knee: Secondary | ICD-10-CM | POA: Diagnosis not present

## 2020-03-20 DIAGNOSIS — E113313 Type 2 diabetes mellitus with moderate nonproliferative diabetic retinopathy with macular edema, bilateral: Secondary | ICD-10-CM | POA: Diagnosis not present

## 2020-03-23 DIAGNOSIS — I482 Chronic atrial fibrillation, unspecified: Secondary | ICD-10-CM | POA: Diagnosis not present

## 2020-03-23 DIAGNOSIS — M1711 Unilateral primary osteoarthritis, right knee: Secondary | ICD-10-CM | POA: Diagnosis not present

## 2020-03-23 DIAGNOSIS — G720 Drug-induced myopathy: Secondary | ICD-10-CM | POA: Diagnosis not present

## 2020-03-23 DIAGNOSIS — E1165 Type 2 diabetes mellitus with hyperglycemia: Secondary | ICD-10-CM | POA: Diagnosis not present

## 2020-03-23 DIAGNOSIS — Z6827 Body mass index (BMI) 27.0-27.9, adult: Secondary | ICD-10-CM | POA: Diagnosis not present

## 2020-03-23 DIAGNOSIS — M222X1 Patellofemoral disorders, right knee: Secondary | ICD-10-CM | POA: Diagnosis not present

## 2020-03-27 DIAGNOSIS — I482 Chronic atrial fibrillation, unspecified: Secondary | ICD-10-CM | POA: Diagnosis not present

## 2020-03-27 DIAGNOSIS — Z6827 Body mass index (BMI) 27.0-27.9, adult: Secondary | ICD-10-CM | POA: Diagnosis not present

## 2020-04-01 DIAGNOSIS — Z6827 Body mass index (BMI) 27.0-27.9, adult: Secondary | ICD-10-CM | POA: Diagnosis not present

## 2020-04-01 DIAGNOSIS — I482 Chronic atrial fibrillation, unspecified: Secondary | ICD-10-CM | POA: Diagnosis not present

## 2020-04-07 DIAGNOSIS — I482 Chronic atrial fibrillation, unspecified: Secondary | ICD-10-CM | POA: Diagnosis not present

## 2020-04-10 DIAGNOSIS — M170 Bilateral primary osteoarthritis of knee: Secondary | ICD-10-CM | POA: Diagnosis not present

## 2020-04-10 DIAGNOSIS — E1165 Type 2 diabetes mellitus with hyperglycemia: Secondary | ICD-10-CM | POA: Diagnosis not present

## 2020-04-10 DIAGNOSIS — I10 Essential (primary) hypertension: Secondary | ICD-10-CM | POA: Diagnosis not present

## 2020-04-10 DIAGNOSIS — Z7984 Long term (current) use of oral hypoglycemic drugs: Secondary | ICD-10-CM | POA: Diagnosis not present

## 2020-04-21 DIAGNOSIS — I4891 Unspecified atrial fibrillation: Secondary | ICD-10-CM | POA: Diagnosis not present

## 2020-04-30 DIAGNOSIS — E113313 Type 2 diabetes mellitus with moderate nonproliferative diabetic retinopathy with macular edema, bilateral: Secondary | ICD-10-CM | POA: Diagnosis not present

## 2020-04-30 DIAGNOSIS — H35033 Hypertensive retinopathy, bilateral: Secondary | ICD-10-CM | POA: Diagnosis not present

## 2020-04-30 DIAGNOSIS — H3582 Retinal ischemia: Secondary | ICD-10-CM | POA: Diagnosis not present

## 2020-05-08 DIAGNOSIS — I10 Essential (primary) hypertension: Secondary | ICD-10-CM | POA: Diagnosis not present

## 2020-05-08 DIAGNOSIS — R634 Abnormal weight loss: Secondary | ICD-10-CM | POA: Diagnosis not present

## 2020-05-08 DIAGNOSIS — E7800 Pure hypercholesterolemia, unspecified: Secondary | ICD-10-CM | POA: Diagnosis not present

## 2020-05-08 DIAGNOSIS — E1165 Type 2 diabetes mellitus with hyperglycemia: Secondary | ICD-10-CM | POA: Diagnosis not present

## 2020-05-08 DIAGNOSIS — G4733 Obstructive sleep apnea (adult) (pediatric): Secondary | ICD-10-CM | POA: Diagnosis not present

## 2020-05-08 DIAGNOSIS — D519 Vitamin B12 deficiency anemia, unspecified: Secondary | ICD-10-CM | POA: Diagnosis not present

## 2020-05-08 DIAGNOSIS — I4891 Unspecified atrial fibrillation: Secondary | ICD-10-CM | POA: Diagnosis not present

## 2020-05-12 DIAGNOSIS — M170 Bilateral primary osteoarthritis of knee: Secondary | ICD-10-CM | POA: Diagnosis not present

## 2020-05-12 DIAGNOSIS — I10 Essential (primary) hypertension: Secondary | ICD-10-CM | POA: Diagnosis not present

## 2020-05-12 DIAGNOSIS — Z7984 Long term (current) use of oral hypoglycemic drugs: Secondary | ICD-10-CM | POA: Diagnosis not present

## 2020-05-12 DIAGNOSIS — E1165 Type 2 diabetes mellitus with hyperglycemia: Secondary | ICD-10-CM | POA: Diagnosis not present

## 2020-05-18 DIAGNOSIS — I482 Chronic atrial fibrillation, unspecified: Secondary | ICD-10-CM | POA: Diagnosis not present

## 2020-05-18 DIAGNOSIS — Z6828 Body mass index (BMI) 28.0-28.9, adult: Secondary | ICD-10-CM | POA: Diagnosis not present

## 2020-05-26 DIAGNOSIS — E1140 Type 2 diabetes mellitus with diabetic neuropathy, unspecified: Secondary | ICD-10-CM | POA: Diagnosis not present

## 2020-05-26 DIAGNOSIS — L609 Nail disorder, unspecified: Secondary | ICD-10-CM | POA: Diagnosis not present

## 2020-05-29 DIAGNOSIS — I482 Chronic atrial fibrillation, unspecified: Secondary | ICD-10-CM | POA: Diagnosis not present

## 2020-06-11 DIAGNOSIS — H3582 Retinal ischemia: Secondary | ICD-10-CM | POA: Diagnosis not present

## 2020-06-11 DIAGNOSIS — Z7984 Long term (current) use of oral hypoglycemic drugs: Secondary | ICD-10-CM | POA: Diagnosis not present

## 2020-06-11 DIAGNOSIS — E1165 Type 2 diabetes mellitus with hyperglycemia: Secondary | ICD-10-CM | POA: Diagnosis not present

## 2020-06-11 DIAGNOSIS — M170 Bilateral primary osteoarthritis of knee: Secondary | ICD-10-CM | POA: Diagnosis not present

## 2020-06-11 DIAGNOSIS — H35033 Hypertensive retinopathy, bilateral: Secondary | ICD-10-CM | POA: Diagnosis not present

## 2020-06-11 DIAGNOSIS — E113313 Type 2 diabetes mellitus with moderate nonproliferative diabetic retinopathy with macular edema, bilateral: Secondary | ICD-10-CM | POA: Diagnosis not present

## 2020-06-11 DIAGNOSIS — I10 Essential (primary) hypertension: Secondary | ICD-10-CM | POA: Diagnosis not present

## 2020-07-10 DIAGNOSIS — I482 Chronic atrial fibrillation, unspecified: Secondary | ICD-10-CM | POA: Diagnosis not present

## 2020-07-11 DIAGNOSIS — E1165 Type 2 diabetes mellitus with hyperglycemia: Secondary | ICD-10-CM | POA: Diagnosis not present

## 2020-07-11 DIAGNOSIS — Z7984 Long term (current) use of oral hypoglycemic drugs: Secondary | ICD-10-CM | POA: Diagnosis not present

## 2020-07-11 DIAGNOSIS — I10 Essential (primary) hypertension: Secondary | ICD-10-CM | POA: Diagnosis not present

## 2020-07-16 DIAGNOSIS — E113313 Type 2 diabetes mellitus with moderate nonproliferative diabetic retinopathy with macular edema, bilateral: Secondary | ICD-10-CM | POA: Diagnosis not present

## 2020-07-16 DIAGNOSIS — H35033 Hypertensive retinopathy, bilateral: Secondary | ICD-10-CM | POA: Diagnosis not present

## 2020-07-16 DIAGNOSIS — H3582 Retinal ischemia: Secondary | ICD-10-CM | POA: Diagnosis not present

## 2020-07-17 DIAGNOSIS — Z23 Encounter for immunization: Secondary | ICD-10-CM | POA: Diagnosis not present

## 2020-07-23 DIAGNOSIS — Z23 Encounter for immunization: Secondary | ICD-10-CM | POA: Diagnosis not present

## 2020-08-11 DIAGNOSIS — I10 Essential (primary) hypertension: Secondary | ICD-10-CM | POA: Diagnosis not present

## 2020-08-11 DIAGNOSIS — E1140 Type 2 diabetes mellitus with diabetic neuropathy, unspecified: Secondary | ICD-10-CM | POA: Diagnosis not present

## 2020-08-11 DIAGNOSIS — E1165 Type 2 diabetes mellitus with hyperglycemia: Secondary | ICD-10-CM | POA: Diagnosis not present

## 2020-08-11 DIAGNOSIS — L609 Nail disorder, unspecified: Secondary | ICD-10-CM | POA: Diagnosis not present

## 2020-08-11 DIAGNOSIS — Z7984 Long term (current) use of oral hypoglycemic drugs: Secondary | ICD-10-CM | POA: Diagnosis not present

## 2020-08-14 DIAGNOSIS — D513 Other dietary vitamin B12 deficiency anemia: Secondary | ICD-10-CM | POA: Diagnosis not present

## 2020-08-14 DIAGNOSIS — I482 Chronic atrial fibrillation, unspecified: Secondary | ICD-10-CM | POA: Diagnosis not present

## 2020-08-14 DIAGNOSIS — Z6828 Body mass index (BMI) 28.0-28.9, adult: Secondary | ICD-10-CM | POA: Diagnosis not present

## 2020-08-14 DIAGNOSIS — E1165 Type 2 diabetes mellitus with hyperglycemia: Secondary | ICD-10-CM | POA: Diagnosis not present

## 2020-08-14 DIAGNOSIS — L719 Rosacea, unspecified: Secondary | ICD-10-CM | POA: Diagnosis not present

## 2020-08-27 DIAGNOSIS — E113313 Type 2 diabetes mellitus with moderate nonproliferative diabetic retinopathy with macular edema, bilateral: Secondary | ICD-10-CM | POA: Diagnosis not present

## 2020-09-24 DIAGNOSIS — Z20828 Contact with and (suspected) exposure to other viral communicable diseases: Secondary | ICD-10-CM | POA: Diagnosis not present

## 2020-09-24 DIAGNOSIS — R059 Cough, unspecified: Secondary | ICD-10-CM | POA: Diagnosis not present

## 2020-10-05 DIAGNOSIS — E113313 Type 2 diabetes mellitus with moderate nonproliferative diabetic retinopathy with macular edema, bilateral: Secondary | ICD-10-CM | POA: Diagnosis not present

## 2020-10-05 DIAGNOSIS — H35033 Hypertensive retinopathy, bilateral: Secondary | ICD-10-CM | POA: Diagnosis not present

## 2020-10-05 DIAGNOSIS — H3582 Retinal ischemia: Secondary | ICD-10-CM | POA: Diagnosis not present

## 2020-11-09 DIAGNOSIS — I10 Essential (primary) hypertension: Secondary | ICD-10-CM | POA: Diagnosis not present

## 2020-11-09 DIAGNOSIS — M170 Bilateral primary osteoarthritis of knee: Secondary | ICD-10-CM | POA: Diagnosis not present

## 2020-11-09 DIAGNOSIS — Z7984 Long term (current) use of oral hypoglycemic drugs: Secondary | ICD-10-CM | POA: Diagnosis not present

## 2020-11-09 DIAGNOSIS — E1165 Type 2 diabetes mellitus with hyperglycemia: Secondary | ICD-10-CM | POA: Diagnosis not present

## 2020-11-09 DIAGNOSIS — E113313 Type 2 diabetes mellitus with moderate nonproliferative diabetic retinopathy with macular edema, bilateral: Secondary | ICD-10-CM | POA: Diagnosis not present

## 2020-11-16 DIAGNOSIS — L609 Nail disorder, unspecified: Secondary | ICD-10-CM | POA: Diagnosis not present

## 2020-11-16 DIAGNOSIS — E1140 Type 2 diabetes mellitus with diabetic neuropathy, unspecified: Secondary | ICD-10-CM | POA: Diagnosis not present

## 2020-12-09 DIAGNOSIS — M170 Bilateral primary osteoarthritis of knee: Secondary | ICD-10-CM | POA: Diagnosis not present

## 2020-12-09 DIAGNOSIS — E1165 Type 2 diabetes mellitus with hyperglycemia: Secondary | ICD-10-CM | POA: Diagnosis not present

## 2020-12-09 DIAGNOSIS — I10 Essential (primary) hypertension: Secondary | ICD-10-CM | POA: Diagnosis not present

## 2020-12-09 DIAGNOSIS — Z7984 Long term (current) use of oral hypoglycemic drugs: Secondary | ICD-10-CM | POA: Diagnosis not present

## 2020-12-11 DIAGNOSIS — D519 Vitamin B12 deficiency anemia, unspecified: Secondary | ICD-10-CM | POA: Diagnosis not present

## 2020-12-11 DIAGNOSIS — E1165 Type 2 diabetes mellitus with hyperglycemia: Secondary | ICD-10-CM | POA: Diagnosis not present

## 2020-12-11 DIAGNOSIS — I10 Essential (primary) hypertension: Secondary | ICD-10-CM | POA: Diagnosis not present

## 2020-12-11 DIAGNOSIS — Z13220 Encounter for screening for lipoid disorders: Secondary | ICD-10-CM | POA: Diagnosis not present

## 2020-12-16 DIAGNOSIS — E113313 Type 2 diabetes mellitus with moderate nonproliferative diabetic retinopathy with macular edema, bilateral: Secondary | ICD-10-CM | POA: Diagnosis not present

## 2020-12-17 DIAGNOSIS — E1165 Type 2 diabetes mellitus with hyperglycemia: Secondary | ICD-10-CM | POA: Diagnosis not present

## 2020-12-17 DIAGNOSIS — L719 Rosacea, unspecified: Secondary | ICD-10-CM | POA: Diagnosis not present

## 2020-12-17 DIAGNOSIS — I482 Chronic atrial fibrillation, unspecified: Secondary | ICD-10-CM | POA: Diagnosis not present

## 2021-01-09 DIAGNOSIS — E1165 Type 2 diabetes mellitus with hyperglycemia: Secondary | ICD-10-CM | POA: Diagnosis not present

## 2021-01-09 DIAGNOSIS — M170 Bilateral primary osteoarthritis of knee: Secondary | ICD-10-CM | POA: Diagnosis not present

## 2021-01-09 DIAGNOSIS — I10 Essential (primary) hypertension: Secondary | ICD-10-CM | POA: Diagnosis not present

## 2021-01-09 DIAGNOSIS — Z7984 Long term (current) use of oral hypoglycemic drugs: Secondary | ICD-10-CM | POA: Diagnosis not present

## 2021-01-18 DIAGNOSIS — E1165 Type 2 diabetes mellitus with hyperglycemia: Secondary | ICD-10-CM | POA: Diagnosis not present

## 2021-01-18 DIAGNOSIS — R9431 Abnormal electrocardiogram [ECG] [EKG]: Secondary | ICD-10-CM | POA: Diagnosis not present

## 2021-01-18 DIAGNOSIS — I444 Left anterior fascicular block: Secondary | ICD-10-CM | POA: Diagnosis not present

## 2021-01-18 DIAGNOSIS — I4891 Unspecified atrial fibrillation: Secondary | ICD-10-CM | POA: Diagnosis not present

## 2021-01-18 DIAGNOSIS — T50A95A Adverse effect of other bacterial vaccines, initial encounter: Secondary | ICD-10-CM | POA: Diagnosis not present

## 2021-01-18 DIAGNOSIS — Z7901 Long term (current) use of anticoagulants: Secondary | ICD-10-CM | POA: Diagnosis not present

## 2021-01-18 DIAGNOSIS — I499 Cardiac arrhythmia, unspecified: Secondary | ICD-10-CM | POA: Diagnosis not present

## 2021-01-18 DIAGNOSIS — I252 Old myocardial infarction: Secondary | ICD-10-CM | POA: Diagnosis not present

## 2021-01-18 DIAGNOSIS — R0902 Hypoxemia: Secondary | ICD-10-CM | POA: Diagnosis not present

## 2021-01-18 DIAGNOSIS — R404 Transient alteration of awareness: Secondary | ICD-10-CM | POA: Diagnosis not present

## 2021-01-18 DIAGNOSIS — E785 Hyperlipidemia, unspecified: Secondary | ICD-10-CM | POA: Diagnosis not present

## 2021-01-18 DIAGNOSIS — Z20822 Contact with and (suspected) exposure to covid-19: Secondary | ICD-10-CM | POA: Diagnosis not present

## 2021-01-18 DIAGNOSIS — I10 Essential (primary) hypertension: Secondary | ICD-10-CM | POA: Diagnosis not present

## 2021-01-18 DIAGNOSIS — I482 Chronic atrial fibrillation, unspecified: Secondary | ICD-10-CM | POA: Diagnosis not present

## 2021-01-18 DIAGNOSIS — Z743 Need for continuous supervision: Secondary | ICD-10-CM | POA: Diagnosis not present

## 2021-01-18 DIAGNOSIS — R0600 Dyspnea, unspecified: Secondary | ICD-10-CM | POA: Diagnosis not present

## 2021-01-18 DIAGNOSIS — E119 Type 2 diabetes mellitus without complications: Secondary | ICD-10-CM | POA: Diagnosis not present

## 2021-01-18 DIAGNOSIS — L719 Rosacea, unspecified: Secondary | ICD-10-CM | POA: Diagnosis not present

## 2021-01-18 DIAGNOSIS — R779 Abnormality of plasma protein, unspecified: Secondary | ICD-10-CM | POA: Diagnosis not present

## 2021-01-18 DIAGNOSIS — R6889 Other general symptoms and signs: Secondary | ICD-10-CM | POA: Diagnosis not present

## 2021-01-18 DIAGNOSIS — R42 Dizziness and giddiness: Secondary | ICD-10-CM | POA: Diagnosis not present

## 2021-01-18 DIAGNOSIS — I2510 Atherosclerotic heart disease of native coronary artery without angina pectoris: Secondary | ICD-10-CM | POA: Diagnosis not present

## 2021-01-18 DIAGNOSIS — R778 Other specified abnormalities of plasma proteins: Secondary | ICD-10-CM | POA: Diagnosis not present

## 2021-01-18 DIAGNOSIS — Z6829 Body mass index (BMI) 29.0-29.9, adult: Secondary | ICD-10-CM | POA: Diagnosis not present

## 2021-01-19 ENCOUNTER — Inpatient Hospital Stay (HOSPITAL_COMMUNITY): Payer: Medicare Other

## 2021-01-19 ENCOUNTER — Inpatient Hospital Stay (HOSPITAL_COMMUNITY)
Admission: AD | Admit: 2021-01-19 | Discharge: 2021-01-22 | DRG: 286 | Disposition: A | Discharge status: Home / Self Care | Payer: Medicare Other | Source: Other Acute Inpatient Hospital | Attending: Internal Medicine | Admitting: Internal Medicine

## 2021-01-19 ENCOUNTER — Encounter (HOSPITAL_COMMUNITY): Payer: Self-pay | Admitting: Internal Medicine

## 2021-01-19 DIAGNOSIS — Z5181 Encounter for therapeutic drug level monitoring: Secondary | ICD-10-CM

## 2021-01-19 DIAGNOSIS — I272 Pulmonary hypertension, unspecified: Secondary | ICD-10-CM

## 2021-01-19 DIAGNOSIS — Z7984 Long term (current) use of oral hypoglycemic drugs: Secondary | ICD-10-CM

## 2021-01-19 DIAGNOSIS — R29818 Other symptoms and signs involving the nervous system: Secondary | ICD-10-CM | POA: Diagnosis not present

## 2021-01-19 DIAGNOSIS — Z87891 Personal history of nicotine dependence: Secondary | ICD-10-CM

## 2021-01-19 DIAGNOSIS — R7989 Other specified abnormal findings of blood chemistry: Secondary | ICD-10-CM | POA: Diagnosis present

## 2021-01-19 DIAGNOSIS — I110 Hypertensive heart disease with heart failure: Secondary | ICD-10-CM | POA: Diagnosis present

## 2021-01-19 DIAGNOSIS — Z951 Presence of aortocoronary bypass graft: Secondary | ICD-10-CM

## 2021-01-19 DIAGNOSIS — E782 Mixed hyperlipidemia: Secondary | ICD-10-CM | POA: Diagnosis present

## 2021-01-19 DIAGNOSIS — R001 Bradycardia, unspecified: Secondary | ICD-10-CM | POA: Diagnosis present

## 2021-01-19 DIAGNOSIS — Z7982 Long term (current) use of aspirin: Secondary | ICD-10-CM

## 2021-01-19 DIAGNOSIS — I2510 Atherosclerotic heart disease of native coronary artery without angina pectoris: Secondary | ICD-10-CM | POA: Diagnosis present

## 2021-01-19 DIAGNOSIS — I482 Chronic atrial fibrillation, unspecified: Secondary | ICD-10-CM | POA: Diagnosis not present

## 2021-01-19 DIAGNOSIS — Z8616 Personal history of COVID-19: Secondary | ICD-10-CM | POA: Diagnosis not present

## 2021-01-19 DIAGNOSIS — E1165 Type 2 diabetes mellitus with hyperglycemia: Secondary | ICD-10-CM | POA: Diagnosis present

## 2021-01-19 DIAGNOSIS — N179 Acute kidney failure, unspecified: Secondary | ICD-10-CM | POA: Diagnosis present

## 2021-01-19 DIAGNOSIS — E1169 Type 2 diabetes mellitus with other specified complication: Secondary | ICD-10-CM | POA: Diagnosis present

## 2021-01-19 DIAGNOSIS — Z20822 Contact with and (suspected) exposure to covid-19: Secondary | ICD-10-CM | POA: Diagnosis present

## 2021-01-19 DIAGNOSIS — I25810 Atherosclerosis of coronary artery bypass graft(s) without angina pectoris: Secondary | ICD-10-CM | POA: Diagnosis present

## 2021-01-19 DIAGNOSIS — I10 Essential (primary) hypertension: Secondary | ICD-10-CM | POA: Diagnosis present

## 2021-01-19 DIAGNOSIS — I340 Nonrheumatic mitral (valve) insufficiency: Secondary | ICD-10-CM | POA: Diagnosis not present

## 2021-01-19 DIAGNOSIS — I5033 Acute on chronic diastolic (congestive) heart failure: Secondary | ICD-10-CM | POA: Diagnosis present

## 2021-01-19 DIAGNOSIS — Z8249 Family history of ischemic heart disease and other diseases of the circulatory system: Secondary | ICD-10-CM

## 2021-01-19 DIAGNOSIS — I6782 Cerebral ischemia: Secondary | ICD-10-CM | POA: Diagnosis not present

## 2021-01-19 DIAGNOSIS — R778 Other specified abnormalities of plasma proteins: Secondary | ICD-10-CM | POA: Diagnosis present

## 2021-01-19 DIAGNOSIS — R55 Syncope and collapse: Secondary | ICD-10-CM | POA: Diagnosis present

## 2021-01-19 DIAGNOSIS — Z79899 Other long term (current) drug therapy: Secondary | ICD-10-CM | POA: Diagnosis not present

## 2021-01-19 DIAGNOSIS — G4733 Obstructive sleep apnea (adult) (pediatric): Secondary | ICD-10-CM | POA: Diagnosis present

## 2021-01-19 DIAGNOSIS — I2582 Chronic total occlusion of coronary artery: Secondary | ICD-10-CM | POA: Diagnosis present

## 2021-01-19 DIAGNOSIS — R079 Chest pain, unspecified: Secondary | ICD-10-CM | POA: Diagnosis not present

## 2021-01-19 DIAGNOSIS — I4819 Other persistent atrial fibrillation: Secondary | ICD-10-CM | POA: Diagnosis present

## 2021-01-19 DIAGNOSIS — R42 Dizziness and giddiness: Secondary | ICD-10-CM | POA: Diagnosis present

## 2021-01-19 DIAGNOSIS — Z7901 Long term (current) use of anticoagulants: Secondary | ICD-10-CM | POA: Diagnosis not present

## 2021-01-19 DIAGNOSIS — I361 Nonrheumatic tricuspid (valve) insufficiency: Secondary | ICD-10-CM | POA: Diagnosis not present

## 2021-01-19 DIAGNOSIS — Z833 Family history of diabetes mellitus: Secondary | ICD-10-CM

## 2021-01-19 DIAGNOSIS — E119 Type 2 diabetes mellitus without complications: Secondary | ICD-10-CM | POA: Diagnosis not present

## 2021-01-19 DIAGNOSIS — I4891 Unspecified atrial fibrillation: Secondary | ICD-10-CM | POA: Diagnosis not present

## 2021-01-19 DIAGNOSIS — I248 Other forms of acute ischemic heart disease: Secondary | ICD-10-CM | POA: Diagnosis present

## 2021-01-19 DIAGNOSIS — I214 Non-ST elevation (NSTEMI) myocardial infarction: Secondary | ICD-10-CM | POA: Diagnosis not present

## 2021-01-19 DIAGNOSIS — T50A95A Adverse effect of other bacterial vaccines, initial encounter: Secondary | ICD-10-CM | POA: Diagnosis not present

## 2021-01-19 HISTORY — DX: Unspecified atrial fibrillation: I48.91

## 2021-01-19 LAB — CBC WITH DIFFERENTIAL/PLATELET
Abs Immature Granulocytes: 0.02 10*3/uL (ref 0.00–0.07)
Basophils Absolute: 0 10*3/uL (ref 0.0–0.1)
Basophils Relative: 0 %
Eosinophils Absolute: 0.1 10*3/uL (ref 0.0–0.5)
Eosinophils Relative: 1 %
HCT: 38.4 % — ABNORMAL LOW (ref 39.0–52.0)
Hemoglobin: 12.2 g/dL — ABNORMAL LOW (ref 13.0–17.0)
Immature Granulocytes: 0 %
Lymphocytes Relative: 18 %
Lymphs Abs: 1.4 10*3/uL (ref 0.7–4.0)
MCH: 30 pg (ref 26.0–34.0)
MCHC: 31.8 g/dL (ref 30.0–36.0)
MCV: 94.6 fL (ref 80.0–100.0)
Monocytes Absolute: 0.6 10*3/uL (ref 0.1–1.0)
Monocytes Relative: 8 %
Neutro Abs: 5.4 10*3/uL (ref 1.7–7.7)
Neutrophils Relative %: 73 %
Platelets: 122 10*3/uL — ABNORMAL LOW (ref 150–400)
RBC: 4.06 MIL/uL — ABNORMAL LOW (ref 4.22–5.81)
RDW: 14.2 % (ref 11.5–15.5)
WBC: 7.5 10*3/uL (ref 4.0–10.5)
nRBC: 0 % (ref 0.0–0.2)

## 2021-01-19 LAB — COMPREHENSIVE METABOLIC PANEL
ALT: 19 U/L (ref 0–44)
AST: 17 U/L (ref 15–41)
Albumin: 3.7 g/dL (ref 3.5–5.0)
Alkaline Phosphatase: 91 U/L (ref 38–126)
Anion gap: 7 (ref 5–15)
BUN: 16 mg/dL (ref 8–23)
CO2: 26 mmol/L (ref 22–32)
Calcium: 9 mg/dL (ref 8.9–10.3)
Chloride: 99 mmol/L (ref 98–111)
Creatinine, Ser: 1.26 mg/dL — ABNORMAL HIGH (ref 0.61–1.24)
GFR, Estimated: >60 mL/min (ref >60)
Glucose, Bld: 363 mg/dL — ABNORMAL HIGH (ref 70–99)
Potassium: 3.8 mmol/L (ref 3.5–5.1)
Sodium: 132 mmol/L — ABNORMAL LOW (ref 135–145)
Total Bilirubin: 1.2 mg/dL (ref 0.3–1.2)
Total Protein: 7.2 g/dL (ref 6.5–8.1)

## 2021-01-19 LAB — HEMOGLOBIN A1C
Hgb A1c MFr Bld: 7.8 % — ABNORMAL HIGH (ref 4.8–5.6)
Mean Plasma Glucose: 177.16 mg/dL

## 2021-01-19 LAB — PROTIME-INR
INR: 1.7 — ABNORMAL HIGH (ref 0.8–1.2)
Prothrombin Time: 19.7 seconds — ABNORMAL HIGH (ref 11.4–15.2)

## 2021-01-19 LAB — GLUCOSE, CAPILLARY: Glucose-Capillary: 314 mg/dL — ABNORMAL HIGH (ref 70–99)

## 2021-01-19 LAB — MAGNESIUM: Magnesium: 1.6 mg/dL — ABNORMAL LOW (ref 1.7–2.4)

## 2021-01-19 LAB — MRSA PCR SCREENING: MRSA by PCR: NEGATIVE

## 2021-01-19 LAB — APTT: aPTT: 39 seconds — ABNORMAL HIGH (ref 24–36)

## 2021-01-19 LAB — D-DIMER, QUANTITATIVE: D-Dimer, Quant: 0.4 ug/mL-FEU (ref 0.00–0.50)

## 2021-01-19 LAB — BRAIN NATRIURETIC PEPTIDE: B Natriuretic Peptide: 1236.8 pg/mL — ABNORMAL HIGH (ref 0.0–100.0)

## 2021-01-19 IMAGING — MR MR HEAD W/O CM
7 of 11 series · 24 of 48 positions shown · non-contrast
Comparison: None available.

CLINICAL DATA: Initial evaluation for neuro deficit, stroke
suspected.

EXAM:
MRI HEAD WITHOUT CONTRAST
TECHNIQUE: Multiplanar, multiecho pulse sequences of the brain and surrounding
structures were obtained without intravenous contrast.

[Series 2: DWI · axial · 3.0mm · 0.94mm/px · z∈[-60,+93]mm · 6 of 103 slices shown (1 of 2)]
[im 1/103]
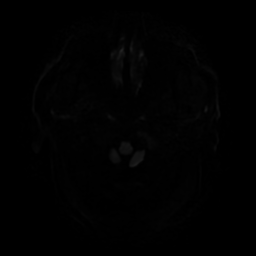
[im 21/103]
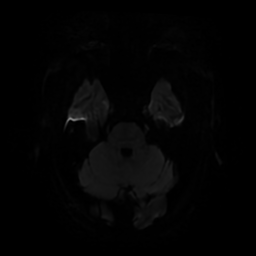
[im 41/103]
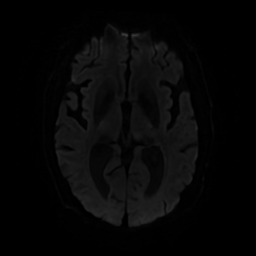
[im 62/103]
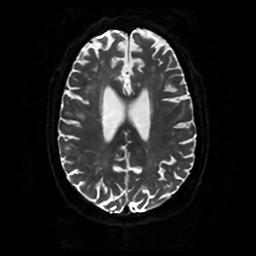
[im 82/103]
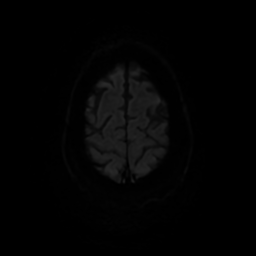
[im 103/103]
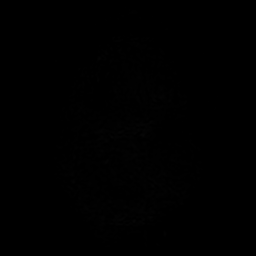

[Series 3: DWI · coronal · 4.0mm · 0.94mm/px · 6 of 84 slices shown (2 of 2)]
[im 1/84]
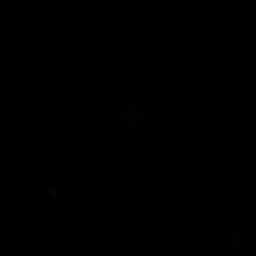
[im 17/84]
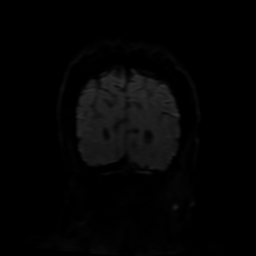
[im 34/84]
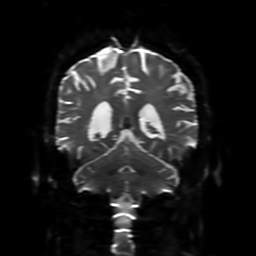
[im 50/84]
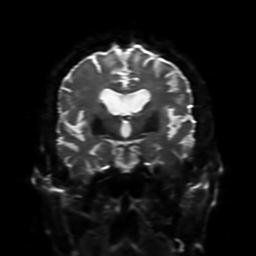
[im 67/84]
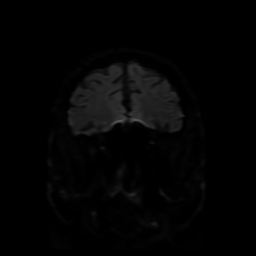
[im 84/84]
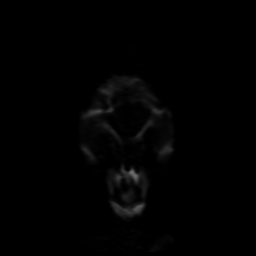

[Series 4: FLAIR · sagittal · 5.0mm · 0.23mm/px · 2 of 27 slices shown (1 of 2)]
[im 1/27]
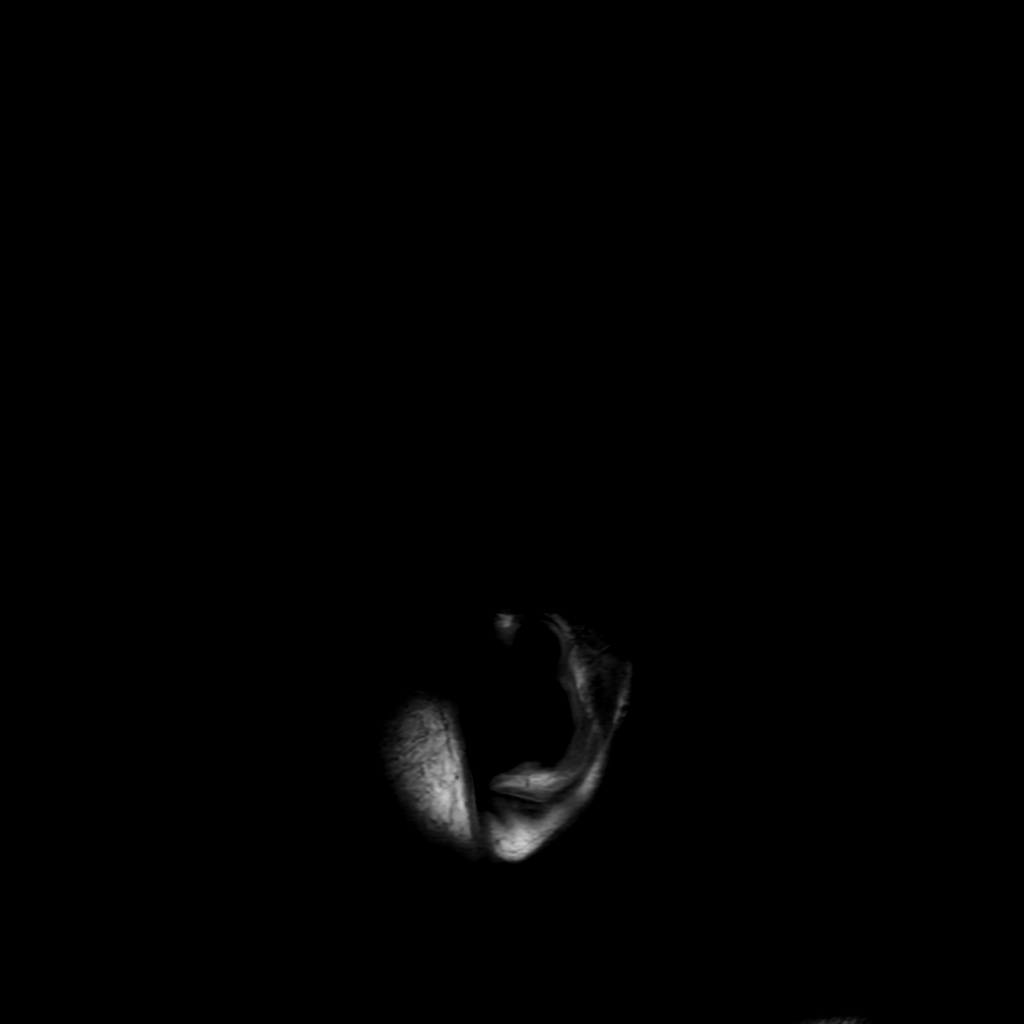
[im 27/27]
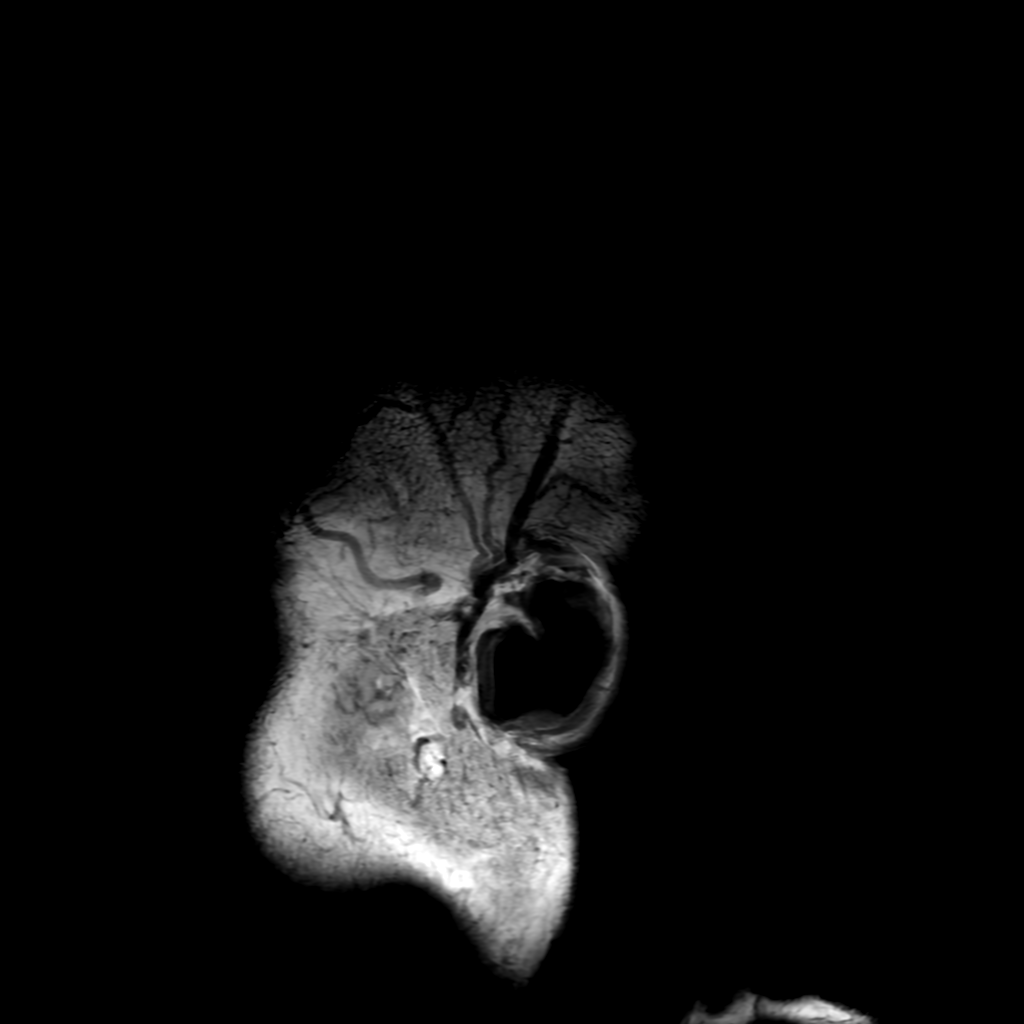

[Series 5: T2 · axial · 5.0mm · 0.23mm/px · 1 of 29 slices shown]
[im 1/29]
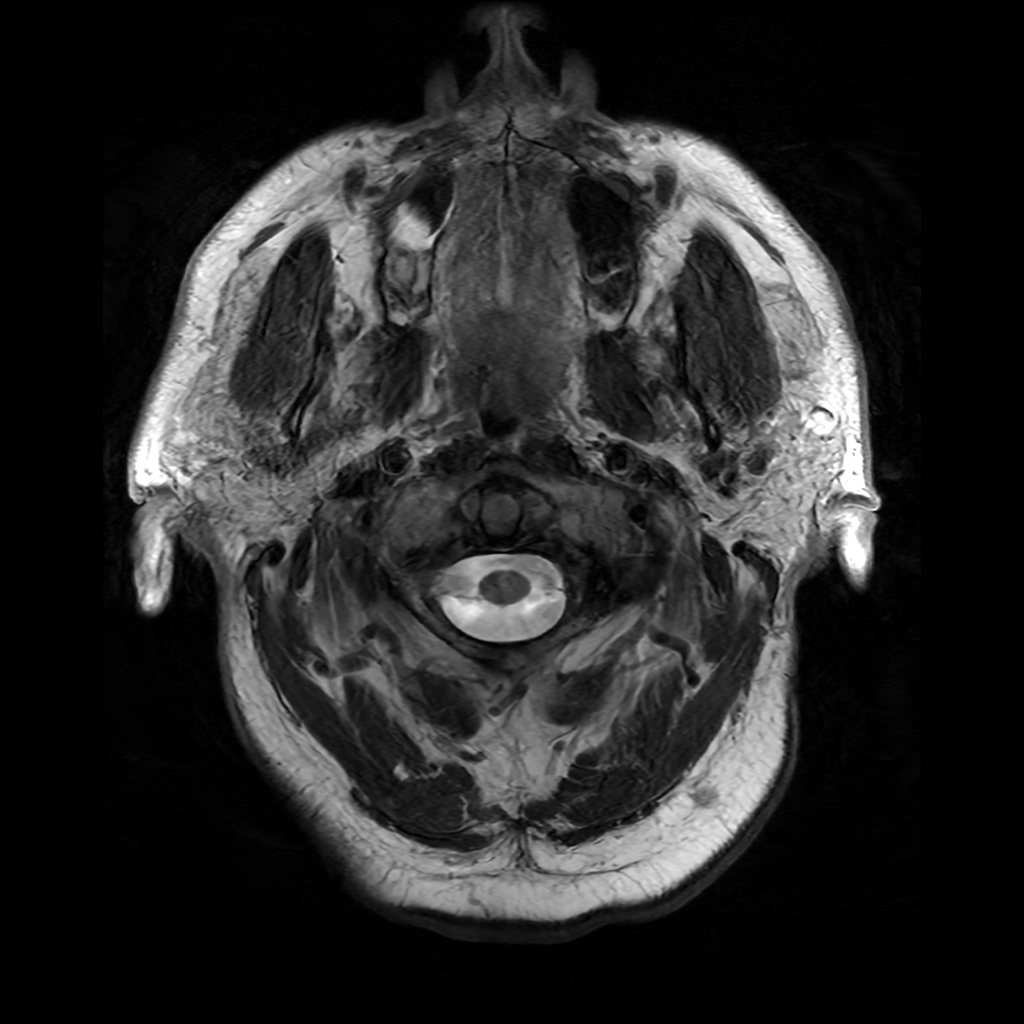

[Series 6: FLAIR · axial · 3.0mm · 0.45mm/px · z∈[-79,+82]mm · 2 of 28 slices shown (2 of 2)]
[im 1/28]
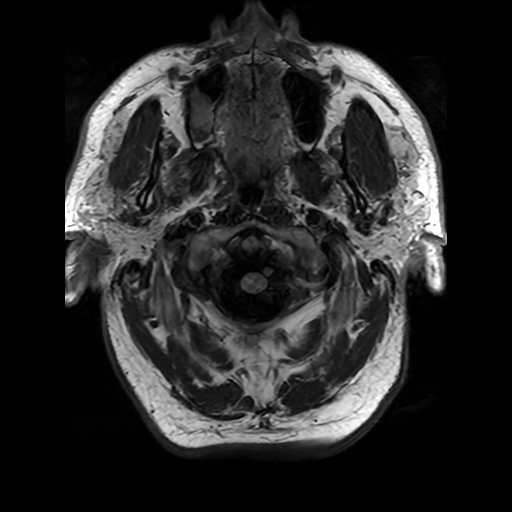
[im 28/28]
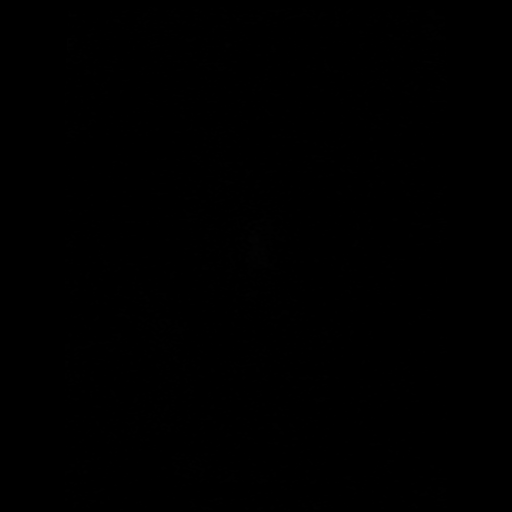

[Series 250: ADC · axial · 3.0mm · 0.94mm/px · z∈[-60,+93]mm · 4 of 52 slices shown (1 of 2)]
[im 1/52]
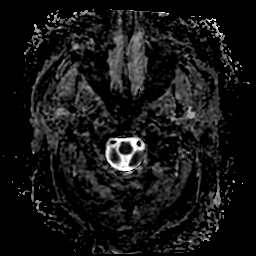
[im 18/52]
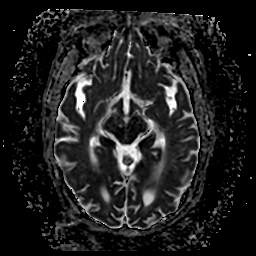
[im 35/52]
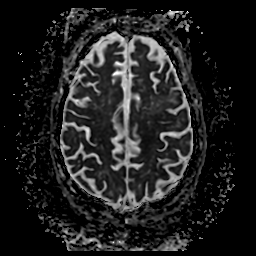
[im 52/52]
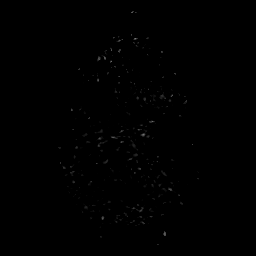

[Series 350: ADC · coronal · 4.0mm · 0.94mm/px · 3 of 41 slices shown (2 of 2)]
[im 1/41]
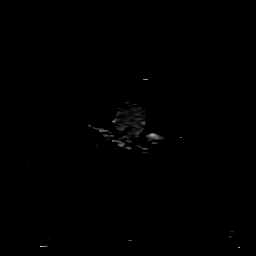
[im 21/41]
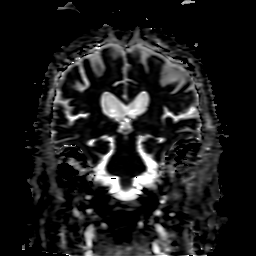
[im 41/41]
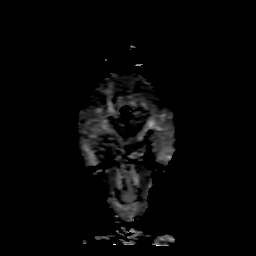

[24 of 48 positions shown; findings below may reference images not displayed]

FINDINGS: Brain: Examination degraded by motion artifact.

Cerebral volume within normal limits. Patchy T2/FLAIR hyperintensity
seen within the periventricular and deep white matter both cerebral
hemispheres, most likely related chronic microvascular ischemic
disease, moderate in nature.

No abnormal foci of restricted diffusion to suggest acute or
subacute ischemia. Gray-white matter differentiation maintained. No
encephalomalacia to suggest chronic cortical infarction. No evidence
for acute or chronic intracranial hemorrhage.

No mass lesion, midline shift or mass effect. No hydrocephalus or
extra-axial fluid collection. Pituitary gland suprasellar region
within normal limits. Midline structures intact.

Vascular: Major intracranial vascular flow voids are maintained.

Skull and upper cervical spine: Craniocervical junction within
normal limits. Bone marrow signal intensity normal. No scalp soft
tissue abnormality.

Sinuses/Orbits: Patient status post bilateral ocular lens
replacement. Globes and orbital soft tissues demonstrate no acute
finding. Paranasal sinuses are largely clear. Trace left mastoid
effusion noted, of doubtful significance. Inner ear structures
grossly normal.

Other: None.
IMPRESSION: 1. No acute intracranial abnormality.
2. Moderate chronic microvascular ischemic disease.

## 2021-01-19 MED ORDER — WARFARIN - PHARMACIST DOSING INPATIENT: Freq: Every day | Status: DC

## 2021-01-19 MED ORDER — ASPIRIN EC 81 MG PO TBEC
81.0000 mg | DELAYED_RELEASE_TABLET | Freq: Every day | ORAL | Status: DC
  Administered 2021-01-20 – 2021-01-22 (×2): 81 mg via ORAL
  Filled 2021-01-19 (×3): qty 1

## 2021-01-19 MED ORDER — NITROGLYCERIN 0.4 MG SL SUBL: 0.4000 mg | SUBLINGUAL_TABLET | SUBLINGUAL | Status: DC | PRN

## 2021-01-19 MED ORDER — IRBESARTAN 150 MG PO TABS
150.0000 mg | ORAL_TABLET | Freq: Every day | ORAL | Status: DC
  Administered 2021-01-20 – 2021-01-22 (×3): 150 mg via ORAL
  Filled 2021-01-19 (×3): qty 1

## 2021-01-19 MED ORDER — SIMVASTATIN 20 MG PO TABS: 40.0000 mg | ORAL_TABLET | Freq: Every day | ORAL | Status: DC

## 2021-01-19 MED ORDER — WARFARIN SODIUM 5 MG PO TABS
5.0000 mg | ORAL_TABLET | Freq: Once | ORAL | Status: AC
  Administered 2021-01-19: 5 mg via ORAL
  Filled 2021-01-19: qty 1

## 2021-01-19 MED ORDER — ROSUVASTATIN CALCIUM 5 MG PO TABS
10.0000 mg | ORAL_TABLET | ORAL | Status: DC
  Administered 2021-01-22: 10 mg via ORAL
  Filled 2021-01-19: qty 2

## 2021-01-19 MED ORDER — ONDANSETRON HCL 4 MG/2ML IJ SOLN: 4.0000 mg | Freq: Four times a day (QID) | INTRAMUSCULAR | Status: DC | PRN

## 2021-01-19 MED ORDER — ACETAMINOPHEN 325 MG PO TABS: 650.0000 mg | ORAL_TABLET | ORAL | Status: DC | PRN

## 2021-01-19 MED ORDER — CITALOPRAM HYDROBROMIDE 20 MG PO TABS: 20.0000 mg | ORAL_TABLET | Freq: Every day | ORAL | Status: DC

## 2021-01-19 MED ORDER — RIVAROXABAN 20 MG PO TABS: 20.0000 mg | ORAL_TABLET | Freq: Every day | ORAL | Status: DC

## 2021-01-19 MED ORDER — MECLIZINE HCL 25 MG PO TABS
25.0000 mg | ORAL_TABLET | Freq: Three times a day (TID) | ORAL | Status: DC | PRN
  Filled 2021-01-19: qty 1

## 2021-01-19 MED ORDER — CARVEDILOL 12.5 MG PO TABS
12.5000 mg | ORAL_TABLET | Freq: Two times a day (BID) | ORAL | Status: DC
  Administered 2021-01-19 – 2021-01-22 (×4): 12.5 mg via ORAL
  Filled 2021-01-19 (×5): qty 1

## 2021-01-19 MED ORDER — INSULIN ASPART 100 UNIT/ML IJ SOLN
0.0000 [IU] | Freq: Three times a day (TID) | INTRAMUSCULAR | Status: DC
  Administered 2021-01-19: 11 [IU] via SUBCUTANEOUS
  Administered 2021-01-20: 3 [IU] via SUBCUTANEOUS
  Administered 2021-01-20: 5 [IU] via SUBCUTANEOUS
  Administered 2021-01-20: 15 [IU] via SUBCUTANEOUS
  Administered 2021-01-20: 5 [IU] via SUBCUTANEOUS
  Administered 2021-01-21: 3 [IU] via SUBCUTANEOUS
  Administered 2021-01-21: 5 [IU] via SUBCUTANEOUS
  Administered 2021-01-21: 11 [IU] via SUBCUTANEOUS
  Administered 2021-01-21: 5 [IU] via SUBCUTANEOUS
  Administered 2021-01-22: 2 [IU] via SUBCUTANEOUS
  Administered 2021-01-22: 5 [IU] via SUBCUTANEOUS

## 2021-01-19 NOTE — Plan of Care (Signed)

## 2021-01-19 NOTE — H&P (Addendum)
History and Physical    READ SCHARR [id number] DOB: [birth year] DOA: 01/19/2021  PCP: [name], MD  Patient coming from: [facility]   Chief Complaint: Vertigo   HPI:    70 year old male with past medical history of coronary artery disease (S/P Cath with stenting 2001 and CABG 2002), BPPV in the past, non-insulin-dependent diabetes mellitus type 2, atrial fibrillation (on coumadin), hypertension, hyperlipidemia who presents to [facility] emergency department via [place] with complaints of vertigo, now arriving at [facility] progressive cardiac unit.  Patient explains that the evening of 5/9 he was getting out of his truck in his front yard when he suddenly began to experience "everything spinning around me."  Patient states that the symptoms were so intense he felt extremely nauseated but did not vomit.  Patient denies any associated chest pain, palpitations, headache, slurred speech or focal weakness.  Patient symptoms were so intense that he laid down in the middle of his front yard and despite waiting and lying still for at least 10 minutes the symptoms continued to be intense.  The patient's wife then came outside and called EMS.  Upon prompt EMS arrived and evaluation, patient continued to experience intense vertigo symptoms even up until time of transport and arrival at [facility], upwards of an hour later.  Patient explains that shortly after arrival at [facility] patient's symptoms subsided "after I got a medication."  Upon evaluation at [facility] noncontrast CT imaging the head revealed no obvious evidence of stroke.  Laboratory work-up included notable slight elevation of high-sensitivity troponins that were 58 and 78.  Upon noting this the patient admitted to a several month history of increasing dyspnea on exertion but denies any exertional chest pain.  ER provider discussed the case with Dr. Blossom Hoops with cardiology at [facility] who recommended  patient be brought to [facility] for full medical work-up and cardiology consultation.  The hospitalist group was then called and the patient was accepted to [facility] progressive unit for continued work-up.   Review of Systems:   Review of Systems  Respiratory: Positive for shortness of breath.   Neurological: Positive for dizziness.  All other systems reviewed and are negative.   Past Medical History:  Diagnosis Date  . A-fib ([place])   . CAD (coronary artery disease)   . Hypertension   . Obstructive sleep apnea    CPAP  . Rosacea   . Type 2 diabetes mellitus with hyperglycemia [facility])     Past Surgical History:  Procedure Laterality Date  . CORONARY ARTERY BYPASS GRAFT     LIMA to the LAD, sequential SVG to ramus intermediate and OM1, sequential SVG to PDA and posterior lateral     reports that he has never smoked. His smokeless tobacco use includes snuff. No history on file for alcohol use and drug use.  No Known Allergies  Family History  Problem Relation Age of Onset  . Heart disease Mother 21  . Diabetes Mother   . Breast cancer Mother   . Heart attack Mother   . Heart disease Father   . Kidney cancer Father        died from cancer  . Heart attack Father 25  . CAD Brother 40       CABG  . Diabetes Brother      Prior to Admission medications   Medication Sig Start Date End Date Taking? Authorizing Provider  aspirin EC 81 MG tablet Take 81 mg by mouth daily.  [provider]  carvedilol (COREG) 12.5 MG tablet Take 12.5 mg by mouth daily.     [provider]  citalopram (CELEXA) 40 MG tablet Take 20 mg by mouth daily.    [provider]  glipiZIDE (GLUCOTROL) 10 MG tablet Take 10 mg by mouth daily before breakfast.    [provider]  metFORMIN (GLUCOPHAGE-XR) 500 MG 24 hr tablet Take 2,000 mg by mouth daily with breakfast.    [provider]  Naproxen Sod-Diphenhydramine (ALEVE PM) 220-25 MG TABS Take 2 tablets by mouth  as needed.    [provider]  rivaroxaban (XARELTO) 20 MG TABS tablet Take 1 tablet by mouth daily with supper.  Please schedule MD appt for further refills 07/10/18   Minus Breeding, MD  simvastatin (ZOCOR) 40 MG tablet Take 40 mg by mouth daily.    [provider]  Turmeric Curcumin 500 MG CAPS Take 2 capsules by mouth daily.    [provider]  valsartan (DIOVAN) 160 MG tablet Take 160 mg by mouth daily.    [provider]    Physical Exam: Vitals:   01/19/21 1830 01/19/21 1900  BP: (!) 183/95 (!) 173/64  Pulse: 68 68  Resp: 19 20  Temp: 98.5 F (36.9 C) 98.1 F (36.7 C)  TempSrc: Oral Oral  SpO2: 97% 97%  Weight:  85.9 kg    Constitutional: Awake alert and oriented x3, no associated distress.   Skin: no rashes, no lesions, good skin turgor noted. Eyes: Pupils are equally reactive to light.  No evidence of scleral icterus or conjunctival pallor.  ENMT: Moist mucous membranes noted.  Posterior pharynx clear of any exudate or lesions.   Neck: normal, supple, no masses, no thyromegaly.  No evidence of jugular venous distension.   Respiratory: clear to auscultation bilaterally, no wheezing, no crackles. Normal respiratory effort. No accessory muscle use.  Cardiovascular: Regular rate and rhythm, no murmurs / rubs / gallops. No extremity edema. 2+ pedal pulses. No carotid bruits.  Chest:   Nontender without crepitus or deformity.   Back:   Nontender without crepitus or deformity. Abdomen: Abdomen is soft and nontender.  No evidence of intra-abdominal masses.  Positive bowel sounds noted in all quadrants.   Musculoskeletal: No joint deformity upper and lower extremities. Good ROM, no contractures. Normal muscle tone.  Neurologic: Dix Hallpike maneuver negative bilaterally.  CN 2-12 grossly intact. Sensation intact.  Patient moving all 4 extremities spontaneously.  Patient is following all commands.  Patient is responsive to verbal stimuli.    Psychiatric: Patient exhibits normal mood with appropriate affect.  Patient seems to possess insight as to their current situation.     Labs on Admission: I have personally reviewed following labs and imaging studies -   CBC: Recent Labs  Lab 01/19/21 2031  WBC 7.5  NEUTROABS 5.4  HGB 12.2*  HCT 38.4*  MCV 94.6  PLT 027*   Basic Metabolic Panel: No results for input(s): NA, K, CL, CO2, GLUCOSE, BUN, CREATININE, CALCIUM, MG, PHOS in the last 168 hours. GFR: CrCl cannot be calculated (Patient's most recent lab result is older than the maximum 21 days allowed.). Liver Function Tests: No results for input(s): AST, ALT, ALKPHOS, BILITOT, PROT, ALBUMIN in the last 168 hours. No results for input(s): LIPASE, AMYLASE in the last 168 hours. No results for input(s): AMMONIA in the last 168 hours. Coagulation Profile: No results for input(s): INR, PROTIME in the last 168 hours. Cardiac Enzymes: No results for  input(s): CKTOTAL, CKMB, CKMBINDEX, TROPONINI in the last 168 hours. BNP (last 3 results) No results for input(s): PROBNP in the last 8760 hours. HbA1C: Recent Labs    01/19/21 2031  HGBA1C 7.8*   CBG: Recent Labs  Lab 01/19/21 2122  GLUCAP 314*   Lipid Profile: No results for input(s): CHOL, HDL, LDLCALC, TRIG, CHOLHDL, LDLDIRECT in the last 72 hours. Thyroid Function Tests: No results for input(s): TSH, T4TOTAL, FREET4, T3FREE, THYROIDAB in the last 72 hours. Anemia Panel: No results for input(s): VITAMINB12, FOLATE, FERRITIN, TIBC, IRON, RETICCTPCT in the last 72 hours. Urine analysis:    Component Value Date/Time   COLORURINE YELLOW 05/28/2007 1324   APPEARANCEUR CLEAR 05/28/2007 1324   LABSPEC 1.004 (L) 05/28/2007 1324   PHURINE 7.0 05/28/2007 1324   GLUCOSEU 250 (A) 05/28/2007 1324   HGBUR NEGATIVE 05/28/2007 1324   BILIRUBINUR NEGATIVE 05/28/2007 1324   KETONESUR NEGATIVE 05/28/2007 1324   PROTEINUR NEGATIVE 05/28/2007 1324   UROBILINOGEN 0.2 05/28/2007  1324   NITRITE NEGATIVE 05/28/2007 1324   LEUKOCYTESUR  05/28/2007 1324    NEGATIVE MICROSCOPIC NOT DONE ON URINES WITH NEGATIVE PROTEIN, BLOOD, LEUKOCYTES, NITRITE, OR GLUCOSE <1000 mg/dL.    Radiological Exams on Admission - Personally Reviewed: No results found.  EKG: Personally reviewed.  Rhythm is atrial fibrillation with heart rate of 63 bpm.  No dynamic ST segment changes appreciated.  Assessment/Plan Principal Problem:   Vertigo  Patient presenting with several hour history of intense vertigo continuously resolved while patient was at [facility] emergency department.  Patient has a history of BPPV in the past but states that this episode was different in nature intensity usual episode.  Dix-Hallpike maneuver negative bilaterally on exam so I am uncertain as to whether or not this is secondary to BPPV  Somewhat unlikely but considering patient's longstanding history of coronary artery disease we will obtain MRI brain to ensure that this is not due to a posterior circulation stroke.  If MRI brain is abnormal we will complete work-up with cerebrovascular imaging and neurology consultation  Serial neurologic checks  As needed meclizine for symptoms although I am uncertain as to how effective this will be.  Active Problems:   Elevated troponin   High-sensitivity troponins at [facility] emergency department were noted to be 29 and 78  Patient has been chest pain-free  EKG reveals atrial fibrillation without dynamic ST segment change  Patient reports compliance with longstanding anticoagulation beta-blocker and statin therapy  ER provider discussed case with Dr. Blossom Hoops, overnight [facility] coverage on 5/9 who recommended transfer to [facility] for medical work-up and cardiology consultation.  We will formally consult cardiology in the morning  We will continue to cycle cardiac enzymes  We will obtain echocardiogram in the morning  Monitoring patient on  telemetry  N.p.o. after midnight in case of noninvasive ischemic work-up    Mixed diabetic hyperlipidemia associated with type 2 diabetes mellitus ([place])  . Continuing home regimen of lipid lowering therapy. . Obtaining lipid panel in the morning    Type 2 diabetes mellitus with hyperglycemia, without long-term current use of insulin ([place])  . Patient been placed on Accu-Cheks before every meal and nightly with sliding scale insulin . Holding home regimen of hypoglycemics . Hemoglobin A1C ordered . Diabetic Diet    Chronic atrial fibrillation ([place])   Continue home regimen of Coreg  Currently rate controlled  Monitoring patient on telemetry  Continuing home regimen of Coumadin with pharmacy to manage.    Essential  hypertension   Continue home regimen of antihypertensive therapy  As needed intravenous antihypertensives for markedly elevated blood pressures.   Code Status:  Full code Family Communication: deferred   Status is: Inpatient  Remains inpatient appropriate because:Ongoing diagnostic testing needed not appropriate for outpatient work up, IV treatments appropriate due to intensity of illness or inability to take PO and Inpatient level of care appropriate due to severity of illness   Dispo: The patient is from: Home              Anticipated d/c is to: Home              Patient currently is not medically stable to d/c.   Difficult to place patient No        Vernelle Emerald MD [facility] Pager [phone]  If 7PM-7AM, please contact night-coverage [email or web address] Use universal [place] password for that web site. If you do not have the password, please call the hospital operator.  01/19/2021, 9:42 PM

## 2021-01-19 NOTE — Progress Notes (Signed)
ANTICOAGULATION CONSULT NOTE - Initial Consult  Pharmacy Consult for warfarin Indication: atrial fibrillation  No Known Allergies  Patient Measurements: Weight: 85.9 kg (189 lb 6 oz)  Vital Signs: Temp: 98.1 F (36.7 C) (05/10 1900) Temp Source: Oral (05/10 1900) BP: 173/64 (05/10 1900) Pulse Rate: 68 (05/10 1900)  Labs: Recent Labs    01/19/21 2031  HGB 12.2*  HCT 38.4*  PLT 122*  APTT 39*  LABPROT 19.7*  INR 1.7*  CREATININE 1.26*    CrCl cannot be calculated (Unknown ideal weight.).   Medical History: Past Medical History:  Diagnosis Date  . A-fib ([place])   . CAD (coronary artery disease)   . Hypertension   . Obstructive sleep apnea    CPAP  . Rosacea   . Type 2 diabetes mellitus with hyperglycemia ([facility])      Assessment: 53 yoM on warfarin PTA for hx AFib admitted from OSH with vertigo. Pt previously on rivaroxaban but could not afford. No warfarin given at OSH, INR 1.7, CBC wnl.  *Home dose 5mg  daily except 2.5mg  TTS  Goal of Therapy:  INR 2-3 Monitor platelets by anticoagulation protocol: Yes   Plan:  Warfarin 5mg  x1 tonight Daily INR   Arrie Senate, PharmD, BCPS, [facility] Clinical Pharmacist [phone] Please check AMION for all [place] numbers 01/19/2021

## 2021-01-20 ENCOUNTER — Inpatient Hospital Stay (HOSPITAL_COMMUNITY): Payer: Medicare Other

## 2021-01-20 ENCOUNTER — Other Ambulatory Visit: Payer: Self-pay

## 2021-01-20 DIAGNOSIS — I361 Nonrheumatic tricuspid (valve) insufficiency: Secondary | ICD-10-CM

## 2021-01-20 DIAGNOSIS — E1169 Type 2 diabetes mellitus with other specified complication: Secondary | ICD-10-CM

## 2021-01-20 DIAGNOSIS — Z7901 Long term (current) use of anticoagulants: Secondary | ICD-10-CM

## 2021-01-20 DIAGNOSIS — E782 Mixed hyperlipidemia: Secondary | ICD-10-CM

## 2021-01-20 DIAGNOSIS — I10 Essential (primary) hypertension: Secondary | ICD-10-CM

## 2021-01-20 DIAGNOSIS — R079 Chest pain, unspecified: Secondary | ICD-10-CM

## 2021-01-20 DIAGNOSIS — R55 Syncope and collapse: Secondary | ICD-10-CM

## 2021-01-20 DIAGNOSIS — R778 Other specified abnormalities of plasma proteins: Secondary | ICD-10-CM

## 2021-01-20 DIAGNOSIS — I2510 Atherosclerotic heart disease of native coronary artery without angina pectoris: Secondary | ICD-10-CM

## 2021-01-20 DIAGNOSIS — I340 Nonrheumatic mitral (valve) insufficiency: Secondary | ICD-10-CM

## 2021-01-20 DIAGNOSIS — I482 Chronic atrial fibrillation, unspecified: Secondary | ICD-10-CM

## 2021-01-20 DIAGNOSIS — R42 Dizziness and giddiness: Secondary | ICD-10-CM

## 2021-01-20 LAB — LIPID PANEL
Cholesterol: 106 mg/dL (ref 0–200)
HDL: 24 mg/dL — ABNORMAL LOW (ref >40)
LDL Cholesterol: 68 mg/dL (ref 0–99)
Total CHOL/HDL Ratio: 4.4 RATIO
Triglycerides: 70 mg/dL (ref <150)
VLDL: 14 mg/dL (ref 0–40)

## 2021-01-20 LAB — HEPARIN LEVEL (UNFRACTIONATED): Heparin Unfractionated: <0.1 IU/mL — ABNORMAL LOW (ref 0.30–0.70)

## 2021-01-20 LAB — ECHOCARDIOGRAM COMPLETE
AR max vel: 3.04 cm2
AV Area VTI: 3.09 cm2
AV Area mean vel: 2.87 cm2
AV Mean grad: 3 mmHg
AV Peak grad: 6 mmHg
Ao pk vel: 1.22 m/s
Area-P 1/2: 4.68 cm2
S' Lateral: 3.8 cm
Weight: 3030 oz

## 2021-01-20 LAB — TROPONIN I (HIGH SENSITIVITY)
Troponin I (High Sensitivity): 50 ng/L — ABNORMAL HIGH (ref <18)
Troponin I (High Sensitivity): 50 ng/L — ABNORMAL HIGH (ref <18)
Troponin I (High Sensitivity): 42 ng/L — ABNORMAL HIGH (ref <18)

## 2021-01-20 LAB — GLUCOSE, CAPILLARY
Glucose-Capillary: 176 mg/dL — ABNORMAL HIGH (ref 70–99)
Glucose-Capillary: 214 mg/dL — ABNORMAL HIGH (ref 70–99)
Glucose-Capillary: 355 mg/dL — ABNORMAL HIGH (ref 70–99)
Glucose-Capillary: 218 mg/dL — ABNORMAL HIGH (ref 70–99)

## 2021-01-20 LAB — PROTIME-INR
INR: 1.7 — ABNORMAL HIGH (ref 0.8–1.2)
Prothrombin Time: 19.6 seconds — ABNORMAL HIGH (ref 11.4–15.2)

## 2021-01-20 LAB — HIV ANTIBODY (ROUTINE TESTING W REFLEX): HIV Screen 4th Generation wRfx: NONREACTIVE

## 2021-01-20 MED ORDER — ASPIRIN 81 MG PO CHEW
81.0000 mg | CHEWABLE_TABLET | ORAL | Status: AC
  Administered 2021-01-21: 81 mg via ORAL
  Filled 2021-01-20: qty 1

## 2021-01-20 MED ORDER — HEPARIN (PORCINE) 25000 UT/250ML-% IV SOLN
1500.0000 [IU]/h | INTRAVENOUS | Status: DC
  Administered 2021-01-20: 1200 [IU]/h via INTRAVENOUS
  Filled 2021-01-20: qty 250

## 2021-01-20 MED ORDER — PERFLUTREN LIPID MICROSPHERE
1.0000 mL | INTRAVENOUS | Status: AC | PRN
  Administered 2021-01-20: 4 mL via INTRAVENOUS
  Filled 2021-01-20: qty 10

## 2021-01-20 MED ORDER — SODIUM CHLORIDE 0.9% FLUSH
3.0000 mL | Freq: Two times a day (BID) | INTRAVENOUS | Status: DC
  Administered 2021-01-20: 3 mL via INTRAVENOUS

## 2021-01-20 MED ORDER — SODIUM CHLORIDE 0.9% FLUSH: 3.0000 mL | INTRAVENOUS | Status: DC | PRN

## 2021-01-20 MED ORDER — MAGNESIUM SULFATE 2 GM/50ML IV SOLN
2.0000 g | Freq: Once | INTRAVENOUS | Status: AC
  Administered 2021-01-20: 2 g via INTRAVENOUS
  Filled 2021-01-20: qty 50

## 2021-01-20 MED ORDER — SODIUM CHLORIDE 0.9 % IV SOLN: 250.0000 mL | INTRAVENOUS | Status: DC | PRN

## 2021-01-20 MED ORDER — WARFARIN SODIUM 5 MG PO TABS: 5.0000 mg | ORAL_TABLET | Freq: Once | ORAL | Status: DC

## 2021-01-20 MED ORDER — SODIUM CHLORIDE 0.9 % IV SOLN: INTRAVENOUS | Status: DC

## 2021-01-20 NOTE — H&P (View-Only) (Signed)
Cardiology Consultation:   Patient ID: GERHARD [name] MRN: [id number]; DOB: 1951-02-01  Admit date: 01/19/2021 Date of Consult: 01/20/2021  PCP:  [name], MD   [facility] Providers Cardiologist:  None        Patient Profile:   [name] is a 70 y.o. male with a hx of Afib, CAD s/p CABG x 5 in 2003, DM, HLD, and HTN who is being seen 01/20/2021 for the evaluation of chest pain at the request of Dr. [name].  History of Present Illness:   [name] ad BMS placed to LAD in 2001 and subsequently CABG x 5 in 2003. Notes suggest EF was reduced, but 60% in 2007. Nuclear stress test at that time was not ischemic. Hx of SVT treated with BB. He developed asymptomatic Afib found incidentally. He was started on xarelto. Echo and POET were never completed. He has not been seen since 2018.  He presented to [facility] after an episode of intense dizziness/vertigo after getting out of his truck. He was nauseous without vomiting. He denied chest pain, dyspnea, and palpitation. On arrival in the ER, symptoms subsided after a "medication." HS troponin were 58 --> 78. Pt did finally report increasing DOE for the past several months, but denied exertional chest pain. He was transferred to [facility] for further workup. Cardiology was consulted.   During my interview, he reports not remembering exact events. He states he got out of his truck and felt as though he would fall. He recalls being dizzy similar to past vertigo spells. He dropped to his knees and then thinks he lost consciousness. His wife found him and called EMS. EMS didn't arrive for another 20 min, making his "down time" approximately 30 min. He denies chest pain but does recount diaphoresis and nausea. He also reports increasing dyspnea on exertion over the past several weeks. He reports no chest pain prior to his MI and before his CABG. He does report SOB and indigestion.  He denies orthopnea, lower extremity swelling, and weight  gain.    Past Medical History:  Diagnosis Date  . A-fib ([place])   . CAD (coronary artery disease)   . Hypertension   . Obstructive sleep apnea    CPAP  . Rosacea   . Type 2 diabetes mellitus with hyperglycemia [facility])     Past Surgical History:  Procedure Laterality Date  . CORONARY ARTERY BYPASS GRAFT     LIMA to the LAD, sequential SVG to ramus intermediate and OM1, sequential SVG to PDA and posterior lateral     Home Medications:  Prior to Admission medications   Medication Sig Start Date End Date Taking? Authorizing Provider  aspirin EC 81 MG tablet Take 81 mg by mouth daily.    [provider]  carvedilol (COREG) 12.5 MG tablet Take 12.5 mg by mouth daily.     [provider]  citalopram (CELEXA) 40 MG tablet Take 20 mg by mouth daily.    [provider]  glipiZIDE (GLUCOTROL) 10 MG tablet Take 10 mg by mouth daily before breakfast.    [provider]  metFORMIN (GLUCOPHAGE-XR) 500 MG 24 hr tablet Take 2,000 mg by mouth daily with breakfast.    [provider]  Naproxen Sod-Diphenhydramine (ALEVE PM) 220-25 MG TABS Take 2 tablets by mouth as needed.    [provider]  rivaroxaban (XARELTO) 20 MG TABS tablet Take 1 tablet by mouth daily with supper.  Please schedule MD appt for further refills 07/10/18  Minus Breeding, MD  simvastatin (ZOCOR) 40 MG tablet Take 40 mg by mouth daily.    [provider]  Turmeric Curcumin 500 MG CAPS Take 2 capsules by mouth daily.    [provider]  valsartan (DIOVAN) 160 MG tablet Take 160 mg by mouth daily.    [provider]    Inpatient Medications: Scheduled Meds: . aspirin EC  81 mg Oral Daily  . carvedilol  12.5 mg Oral BID WC  . citalopram  20 mg Oral Daily  . insulin aspart  0-15 Units Subcutaneous TID AC & HS  . irbesartan  150 mg Oral Daily  . [START ON 01/22/2021] rosuvastatin  10 mg Oral Once per day on Mon Fri  . warfarin  5 mg Oral ONCE-1600  .  Warfarin - Pharmacist Dosing Inpatient   Does not apply q1600   Continuous Infusions:  PRN Meds: acetaminophen, meclizine, nitroGLYCERIN, ondansetron (ZOFRAN) IV, perflutren lipid microspheres (DEFINITY) IV suspension  Allergies:   No Known Allergies  Social History:   Social History   Socioeconomic History  . Marital status: Married    Spouse name: Not on file  . Number of children: 3  . Years of education: Not on file  . Highest education level: Not on file  Occupational History  . Not on file  Tobacco Use  . Smoking status: Never Smoker  . Smokeless tobacco: Current User    Types: Snuff  Substance and Sexual Activity  . Alcohol use: Not on file  . Drug use: Not on file  . Sexual activity: Not on file  Other Topics Concern  . Not on file  Social History Narrative   Lives with wife and two grandchildren.     Social Determinants of Health   Financial Resource Strain: Not on file  Food Insecurity: Not on file  Transportation Needs: Not on file  Physical Activity: Not on file  Stress: Not on file  Social Connections: Not on file  Intimate Partner Violence: Not on file    Family History:    Family History  Problem Relation Age of Onset  . Heart disease Mother 80  . Diabetes Mother   . Breast cancer Mother   . Heart attack Mother   . Heart disease Father   . Kidney cancer Father        died from cancer  . Heart attack Father 95  . CAD Brother 6       CABG  . Diabetes Brother      ROS:  Please see the history of present illness.   All other ROS reviewed and negative.     Physical Exam/Data:   Vitals:   01/20/21 0358 01/20/21 0700 01/20/21 1001 01/20/21 1202  BP:  110/89 121/76 (!) 141/78  Pulse: (!) 43 74 62 74  Resp: (!) 21 17 17 18   Temp:  98.3 F (36.8 C)  98.6 F (37 C)  TempSrc:  Oral  Oral  SpO2: 95% 93% 93% 91%  Weight:        Intake/Output Summary (Last 24 hours) at 01/20/2021 1442 Last data filed at 01/20/2021 1436 Gross per 24 hour   Intake 360 ml  Output --  Net 360 ml   Last 3 Weights 01/19/2021 03/01/2017  Weight (lbs) 189 lb 6 oz 165 lb 6.4 oz  Weight (kg) 85.9 kg 75.025 kg     Body mass index is 27.97 kg/m.  General:  Well nourished, well developed, in no acute distress HEENT:  normal Neck: no JVD Vascular: No carotid bruits Cardiac:  Irregular rhythm regular rate, no murmur Lungs:  clear to auscultation bilaterally, no wheezing, rhonchi or rales  Abd: soft, nontender, no hepatomegaly  Ext: minimal B LE edema Musculoskeletal:  No deformities, BUE and BLE strength normal and equal Skin: warm and dry  Neuro:  CNs 2-12 intact, no focal abnormalities noted Psych:  Normal affect   EKG:  The EKG was personally reviewed and demonstrates:  Afib HR 66, LAFB, TWI lateral leads Telemetry:  Telemetry was personally reviewed and demonstrates:  Afib with ventricular rates 70s, pauses up to 2.96 sec  Relevant CV Studies:  Echo 2018: Study Conclusions  - Left ventricle: The cavity size was normal. Wall thickness was  increased in a pattern of mild LVH. Systolic function was at the  lower limits of normal. The estimated ejection fraction was 50%.  Diffuse hypokinesis. The study was not technically sufficient to  allow evaluation of LV diastolic dysfunction due to atrial  fibrillation. Doppler parameters are consistent with high  ventricular filling pressure.  - Regional wall motion abnormality: Mild hypokinesis of the apical  myocardium.  - Aortic valve: Mildly calcified annulus. Trileaflet.  - Aorta: Mild aortic root dilatation. Aortic root dimension: 39 mm  (ED).  - Mitral valve: Mildly thickened leaflets . There was moderate  regurgitation, with multiple jets.  - Left atrium: The atrium was moderately dilated.  - Right ventricle: Systolic function was mildly reduced.  - Tricuspid valve: There was mild regurgitation.  - Pulmonary arteries: PA peak pressure: 34 mm Hg (S).   Laboratory  Data:  High Sensitivity Troponin:   Recent Labs  Lab 01/20/21 0236 01/20/21 0821  TROPONINIHS 50* 50*     Chemistry Recent Labs  Lab 01/19/21 2031  NA 132*  K 3.8  CL 99  CO2 26  GLUCOSE 363*  BUN 16  CREATININE 1.26*  CALCIUM 9.0  GFRNONAA >60  ANIONGAP 7    Recent Labs  Lab 01/19/21 2031  PROT 7.2  ALBUMIN 3.7  AST 17  ALT 19  ALKPHOS 91  BILITOT 1.2   Hematology Recent Labs  Lab 01/19/21 2031  WBC 7.5  RBC 4.06*  HGB 12.2*  HCT 38.4*  MCV 94.6  MCH 30.0  MCHC 31.8  RDW 14.2  PLT 122*   BNP Recent Labs  Lab 01/19/21 2031  BNP 1,236.8*    DDimer  Recent Labs  Lab 01/19/21 2031  DDIMER 0.40     Radiology/Studies:  MR BRAIN WO CONTRAST  Result Date: 01/20/2021 CLINICAL DATA:  Initial evaluation for neuro deficit, stroke suspected. EXAM: MRI HEAD WITHOUT CONTRAST TECHNIQUE: Multiplanar, multiecho pulse sequences of the brain and surrounding structures were obtained without intravenous contrast. COMPARISON:  None available. FINDINGS: Brain: Examination degraded by motion artifact. Cerebral volume within normal limits. Patchy T2/FLAIR hyperintensity seen within the periventricular and deep white matter both cerebral hemispheres, most likely related chronic microvascular ischemic disease, moderate in nature. No abnormal foci of restricted diffusion to suggest acute or subacute ischemia. Gray-white matter differentiation maintained. No encephalomalacia to suggest chronic cortical infarction. No evidence for acute or chronic intracranial hemorrhage. No mass lesion, midline shift or mass effect. No hydrocephalus or extra-axial fluid collection. Pituitary gland suprasellar region within normal limits. Midline structures intact. Vascular: Major intracranial vascular flow voids are maintained. Skull and upper cervical spine: Craniocervical junction within normal limits. Bone marrow signal intensity normal. No scalp soft tissue abnormality. Sinuses/Orbits: Patient  status post bilateral ocular lens  replacement. Globes and orbital soft tissues demonstrate no acute finding. Paranasal sinuses are largely clear. Trace left mastoid effusion noted, of doubtful significance. Inner ear structures grossly normal. Other: None. IMPRESSION: 1. No acute intracranial abnormality. 2. Moderate chronic microvascular ischemic disease. Electronically Signed   By: Jeannine Boga M.D.   On: 01/20/2021 03:34     Assessment and Plan:   Dyspnea on exertion  - BNP 1237 - CXR with no acute abnormalities noted - echo pending - he does not appear extremely volume overloaded and is lying in bed comfortable - question if his dyspnea on exertion with diaphoresis and nausea is his angina equivalent - will await echo results, but suspect he will need an ischemic evaluation, if echo abnormal will recommend definitive angiography, if normal may consider nuclear stress test   Possible syncope - there was a period of time he doesn't remember, doesn't remember getting to the ground - continue to monitor on telemetry - pauses on telemetry less than 2.96 sec - consider OP heart monitor   Persistent atrial fibrillation Chronic anticoagulation - rate controlled with coreg - [place] with coumadin - INR 1.7 - asymptomatic - hold coumadin, start heparin drip for possible heart cath - Mg 1.6 --> replace with IV   CAD s/p CABG x 5 2003 - no recent ischemic evaluation - hs troponin 58 --> 78 --> 76 --> 59 - EKG with TWI lateral leads - continue ASA, BB, statin - echo results will guide next steps: heart catheterization vs nuclear stress test   Hypertension - coreg 12.5 mg BID, irbesartan 150 mg daily   Hyperlipidemia with LDL goal < 70 01/20/2021: Cholesterol 106; HDL 24; LDL Cholesterol 68; Triglycerides 70; VLDL 14 - on 10 mg crestor   AKI - sCr 1.26 - baseline unclear   DM - A1c 7.8% - SSI here     Risk Assessment/Risk Scores:   New York Heart Association  (NYHA) Functional Class NYHA Class III  CHA2DS2-VASc Score = 4  This indicates a 4.8% annual risk of stroke. The patient's score is based upon: CHF History: No HTN History: Yes Diabetes History: Yes Stroke History: No Vascular Disease History: Yes Age Score: 1 Gender Score: 0       For questions or updates, please contact [place] Please consult [email or web address] for contact info under    Signed, Ledora Bottcher, [place]  01/20/2021 2:42 PM      Patient seen and examined. Agree with assessment and plan.  Mr. Chael Urenda is a very pleasant 70 year old gentleman who is a remote patient of Dr. Ron Parker.  He had previously undergone stenting but approximately 1 year later had developed restenosis/occlusion.  In 2003 he underwent CABG x5 revascularization surgery here at [facility] (he cannot recall the surgeon).  He has been followed by Dr. [name] in [place] until his retirement.  He has recently established with a new primary care physician in the same practice.  Patient was seen by Dr. Percival Spanish in 2018 and was in atrial fibrillation at which time he was started on Xarelto.  An echo Doppler study and stress test were recommended but never completed.  Patient saw his primary physician on Monday.  At that time the patient had complained of some increasing fatigue and it was advised that he have a echo Doppler study.  Later that evening, he developed an episode of significant dizziness/vertigo after getting out of his truck.  He denied associated chest pain or awareness of palpitations.  EMS  ultimately arrived and he was transported to [facility].  Troponins were minimally elevated but not suggestive of an acute STEMI.  With his history of increasing dyspnea for the past several months he is now transferred to [facility] for further evaluation.  Patient states he never had previously experienced chest pain but had noticed a vague indigestion sensation when he had his heart attack.  He  denies recent chest discomfort.  He has been on warfarin for anticoagulation.  Apparently he took a dose last night at [facility].  INR today is 1.7.  Presently he is in no acute distress.  He retired 1 month ago.  Blood pressure earlier was 141/78.  We will check orthostatics.  There were no carotid bruits.  There was no JVD.  Lungs were clear.  Rhythm was regular with a 1/6 systolic murmur.  There was no S3 gallop.  Abdomen was soft nontender.  Pulses were 2+.  There was no significant edema.  Chest x-ray did not reveal any acute abnormalities.  BNP was elevated at 1237.  Strahl 68 with low HDL at 24.  Troponins x2 here are 50.  Initial glucose was elevated at 314.  An echo Doppler study was just completed, results are pending but pulmonary review suggest reduction of LV function compared to prior study.  In this patient who is 19 years status post CABG revascularization, I have recommended definitive repeat cardiac catheterization to assess for native CAD as well as graft patency.  We will continue to hold warfarin.  We will need to recheck INR in a.m. to make certain this has not risen since he had received a dose of warfarin last evening.I have reviewed the risks, indications, and alternatives to cardiac catheterization, possible angioplasty, and stenting with the patient. Risks include but are not limited to bleeding, infection, vascular injury, stroke, myocardial infection, arrhythmia, kidney injury, radiation-related injury in the case of prolonged fluoroscopy use, emergency cardiac surgery, and death. The patient understands the risks of serious complication is 1-2 in 5993 with diagnostic cardiac cath and 1-2% or less with angioplasty/stenting.  We will keep n.p.o. in a.m.   Troy Sine, MD, [facility] 01/20/2021 3:03 PM

## 2021-01-20 NOTE — Progress Notes (Addendum)
[place] for warfarin Indication: atrial fibrillation  No Known Allergies  Patient Measurements: Weight: 85.9 kg (189 lb 6 oz)  Vital Signs: Temp: 98.6 F (37 C) (05/11 1202) Temp Source: Oral (05/11 1202) BP: 141/78 (05/11 1202) Pulse Rate: 74 (05/11 1202)  Labs: Recent Labs    01/19/21 2031 01/20/21 0026 01/20/21 0236 01/20/21 0821  HGB 12.2*  --   --   --   HCT 38.4*  --   --   --   PLT 122*  --   --   --   APTT 39*  --   --   --   LABPROT 19.7* 19.6*  --   --   INR 1.7* 1.7*  --   --   CREATININE 1.26*  --   --   --   TROPONINIHS  --   --  50* 50*    CrCl cannot be calculated (Unknown ideal weight.).   Medical History: Past Medical History:  Diagnosis Date  . A-fib ([place])   . CAD (coronary artery disease)   . Hypertension   . Obstructive sleep apnea    CPAP  . Rosacea   . Type 2 diabetes mellitus with hyperglycemia (H[facility])      Assessment: 42 yoM on warfarin PTA for hx AFib admitted from OSH with vertigo. Pt previously on rivaroxaban but could not afford. No warfarin given at OSH, INR 1.7, CBC wnl.  INR still low early this am at 1.7, warfarin given last night.   *Home dose 5mg  daily except 2.5mg  TTS  Goal of Therapy:  INR 2-3 Monitor platelets by anticoagulation protocol: Yes   Plan:  Repeat Warfarin 5mg  x1 tonight Daily INR  Erin Hearing PharmD., BCPS Clinical Pharmacist 01/20/2021 2:29 PM   Cards now planning cath tomorrow, new orders to start heparin and hold warfarin. INR 1.7 will not bolus heparin.  Start heparin infusion at 1200 units/hr Check level in 6 hours  01/20/2021 3:17 PM

## 2021-01-20 NOTE — Consult Note (Addendum)
Cardiology Consultation:   Patient ID: [name] MRN: [id number]; DOB: 16-Sep-1950  Admit date: 01/19/2021 Date of Consult: 01/20/2021  PCP:  [name], MD   [facility] Providers Cardiologist:  None        Patient Profile:   [name] is a 70 y.o. male with a hx of Afib, CAD s/p CABG x 5 in 2003, DM, HLD, and HTN who is being seen 01/20/2021 for the evaluation of chest pain at the request of Dr. [name].  History of Present Illness:   [name] ad BMS placed to LAD in 2001 and subsequently CABG x 5 in 2003. Notes suggest EF was reduced, but 60% in 2007. Nuclear stress test at that time was not ischemic. Hx of SVT treated with BB. He developed asymptomatic Afib found incidentally. He was started on xarelto. Echo and POET were never completed. He has not been seen since 2018.  He presented to [facility] after an episode of intense dizziness/vertigo after getting out of his truck. He was nauseous without vomiting. He denied chest pain, dyspnea, and palpitation. On arrival in the ER, symptoms subsided after a "medication." HS troponin were 58 --> 78. Pt did finally report increasing DOE for the past several months, but denied exertional chest pain. He was transferred to [facility] for further workup. Cardiology was consulted.   During my interview, he reports not remembering exact events. He states he got out of his truck and felt as though he would fall. He recalls being dizzy similar to past vertigo spells. He dropped to his knees and then thinks he lost consciousness. His wife found him and called EMS. EMS didn't arrive for another 20 min, making his "down time" approximately 30 min. He denies chest pain but does recount diaphoresis and nausea. He also reports increasing dyspnea on exertion over the past several weeks. He reports no chest pain prior to his MI and before his CABG. He does report SOB and indigestion.  He denies orthopnea, lower extremity swelling, and weight  gain.    Past Medical History:  Diagnosis Date  . A-fib ([place])   . CAD (coronary artery disease)   . Hypertension   . Obstructive sleep apnea    CPAP  . Rosacea   . Type 2 diabetes mellitus with hyperglycemia [facility])     Past Surgical History:  Procedure Laterality Date  . CORONARY ARTERY BYPASS GRAFT     LIMA to the LAD, sequential SVG to ramus intermediate and OM1, sequential SVG to PDA and posterior lateral     Home Medications:  Prior to Admission medications   Medication Sig Start Date End Date Taking? Authorizing Provider  aspirin EC 81 MG tablet Take 81 mg by mouth daily.    [provider]  carvedilol (COREG) 12.5 MG tablet Take 12.5 mg by mouth daily.     [provider]  citalopram (CELEXA) 40 MG tablet Take 20 mg by mouth daily.    [provider]  glipiZIDE (GLUCOTROL) 10 MG tablet Take 10 mg by mouth daily before breakfast.    [provider]  metFORMIN (GLUCOPHAGE-XR) 500 MG 24 hr tablet Take 2,000 mg by mouth daily with breakfast.    [provider]  Naproxen Sod-Diphenhydramine (ALEVE PM) 220-25 MG TABS Take 2 tablets by mouth as needed.    [provider]  rivaroxaban (XARELTO) 20 MG TABS tablet Take 1 tablet by mouth daily with supper.  Please schedule MD appt for further refills 07/10/18  Minus Breeding, MD  simvastatin (ZOCOR) 40 MG tablet Take 40 mg by mouth daily.    [provider]  Turmeric Curcumin 500 MG CAPS Take 2 capsules by mouth daily.    [provider]  valsartan (DIOVAN) 160 MG tablet Take 160 mg by mouth daily.    [provider]    Inpatient Medications: Scheduled Meds: . aspirin EC  81 mg Oral Daily  . carvedilol  12.5 mg Oral BID WC  . citalopram  20 mg Oral Daily  . insulin aspart  0-15 Units Subcutaneous TID AC & HS  . irbesartan  150 mg Oral Daily  . [START ON 01/22/2021] rosuvastatin  10 mg Oral Once per day on Mon Fri  . warfarin  5 mg Oral ONCE-1600  .  Warfarin - Pharmacist Dosing Inpatient   Does not apply q1600   Continuous Infusions:  PRN Meds: acetaminophen, meclizine, nitroGLYCERIN, ondansetron (ZOFRAN) IV, perflutren lipid microspheres (DEFINITY) IV suspension  Allergies:   No Known Allergies  Social History:   Social History   Socioeconomic History  . Marital status: Married    Spouse name: Not on file  . Number of children: 3  . Years of education: Not on file  . Highest education level: Not on file  Occupational History  . Not on file  Tobacco Use  . Smoking status: Never Smoker  . Smokeless tobacco: Current User    Types: Snuff  Substance and Sexual Activity  . Alcohol use: Not on file  . Drug use: Not on file  . Sexual activity: Not on file  Other Topics Concern  . Not on file  Social History Narrative   Lives with wife and two grandchildren.     Social Determinants of Health   Financial Resource Strain: Not on file  Food Insecurity: Not on file  Transportation Needs: Not on file  Physical Activity: Not on file  Stress: Not on file  Social Connections: Not on file  Intimate Partner Violence: Not on file    Family History:    Family History  Problem Relation Age of Onset  . Heart disease Mother 67  . Diabetes Mother   . Breast cancer Mother   . Heart attack Mother   . Heart disease Father   . Kidney cancer Father        died from cancer  . Heart attack Father 71  . CAD Brother 35       CABG  . Diabetes Brother      ROS:  Please see the history of present illness.   All other ROS reviewed and negative.     Physical Exam/Data:   Vitals:   01/20/21 0358 01/20/21 0700 01/20/21 1001 01/20/21 1202  BP:  110/89 121/76 (!) 141/78  Pulse: (!) 43 74 62 74  Resp: (!) 21 17 17 18   Temp:  98.3 F (36.8 C)  98.6 F (37 C)  TempSrc:  Oral  Oral  SpO2: 95% 93% 93% 91%  Weight:        Intake/Output Summary (Last 24 hours) at 01/20/2021 1442 Last data filed at 01/20/2021 1436 Gross per 24 hour   Intake 360 ml  Output --  Net 360 ml   Last 3 Weights 01/19/2021 03/01/2017  Weight (lbs) 189 lb 6 oz 165 lb 6.4 oz  Weight (kg) 85.9 kg 75.025 kg     Body mass index is 27.97 kg/m.  General:  Well nourished, well developed, in no acute distress HEENT:  normal Neck: no JVD Vascular: No carotid bruits Cardiac:  Irregular rhythm regular rate, no murmur Lungs:  clear to auscultation bilaterally, no wheezing, rhonchi or rales  Abd: soft, nontender, no hepatomegaly  Ext: minimal B LE edema Musculoskeletal:  No deformities, BUE and BLE strength normal and equal Skin: warm and dry  Neuro:  CNs 2-12 intact, no focal abnormalities noted Psych:  Normal affect   EKG:  The EKG was personally reviewed and demonstrates:  Afib HR 66, LAFB, TWI lateral leads Telemetry:  Telemetry was personally reviewed and demonstrates:  Afib with ventricular rates 70s, pauses up to 2.96 sec  Relevant CV Studies:  Echo 2018: Study Conclusions  - Left ventricle: The cavity size was normal. Wall thickness was  increased in a pattern of mild LVH. Systolic function was at the  lower limits of normal. The estimated ejection fraction was 50%.  Diffuse hypokinesis. The study was not technically sufficient to  allow evaluation of LV diastolic dysfunction due to atrial  fibrillation. Doppler parameters are consistent with high  ventricular filling pressure.  - Regional wall motion abnormality: Mild hypokinesis of the apical  myocardium.  - Aortic valve: Mildly calcified annulus. Trileaflet.  - Aorta: Mild aortic root dilatation. Aortic root dimension: 39 mm  (ED).  - Mitral valve: Mildly thickened leaflets . There was moderate  regurgitation, with multiple jets.  - Left atrium: The atrium was moderately dilated.  - Right ventricle: Systolic function was mildly reduced.  - Tricuspid valve: There was mild regurgitation.  - Pulmonary arteries: PA peak pressure: 34 mm Hg (S).   Laboratory  Data:  High Sensitivity Troponin:   Recent Labs  Lab 01/20/21 0236 01/20/21 0821  TROPONINIHS 50* 50*     Chemistry Recent Labs  Lab 01/19/21 2031  NA 132*  K 3.8  CL 99  CO2 26  GLUCOSE 363*  BUN 16  CREATININE 1.26*  CALCIUM 9.0  GFRNONAA >60  ANIONGAP 7    Recent Labs  Lab 01/19/21 2031  PROT 7.2  ALBUMIN 3.7  AST 17  ALT 19  ALKPHOS 91  BILITOT 1.2   Hematology Recent Labs  Lab 01/19/21 2031  WBC 7.5  RBC 4.06*  HGB 12.2*  HCT 38.4*  MCV 94.6  MCH 30.0  MCHC 31.8  RDW 14.2  PLT 122*   BNP Recent Labs  Lab 01/19/21 2031  BNP 1,236.8*    DDimer  Recent Labs  Lab 01/19/21 2031  DDIMER 0.40     Radiology/Studies:  MR BRAIN WO CONTRAST  Result Date: 01/20/2021 CLINICAL DATA:  Initial evaluation for neuro deficit, stroke suspected. EXAM: MRI HEAD WITHOUT CONTRAST TECHNIQUE: Multiplanar, multiecho pulse sequences of the brain and surrounding structures were obtained without intravenous contrast. COMPARISON:  None available. FINDINGS: Brain: Examination degraded by motion artifact. Cerebral volume within normal limits. Patchy T2/FLAIR hyperintensity seen within the periventricular and deep white matter both cerebral hemispheres, most likely related chronic microvascular ischemic disease, moderate in nature. No abnormal foci of restricted diffusion to suggest acute or subacute ischemia. Gray-white matter differentiation maintained. No encephalomalacia to suggest chronic cortical infarction. No evidence for acute or chronic intracranial hemorrhage. No mass lesion, midline shift or mass effect. No hydrocephalus or extra-axial fluid collection. Pituitary gland suprasellar region within normal limits. Midline structures intact. Vascular: Major intracranial vascular flow voids are maintained. Skull and upper cervical spine: Craniocervical junction within normal limits. Bone marrow signal intensity normal. No scalp soft tissue abnormality. Sinuses/Orbits: Patient  status post bilateral ocular lens  replacement. Globes and orbital soft tissues demonstrate no acute finding. Paranasal sinuses are largely clear. Trace left mastoid effusion noted, of doubtful significance. Inner ear structures grossly normal. Other: None. IMPRESSION: 1. No acute intracranial abnormality. 2. Moderate chronic microvascular ischemic disease. Electronically Signed   By: Jeannine Boga M.D.   On: 01/20/2021 03:34     Assessment and Plan:   Dyspnea on exertion  - BNP 1237 - CXR with no acute abnormalities noted - echo pending - he does not appear extremely volume overloaded and is lying in bed comfortable - question if his dyspnea on exertion with diaphoresis and nausea is his angina equivalent - will await echo results, but suspect he will need an ischemic evaluation, if echo abnormal will recommend definitive angiography, if normal may consider nuclear stress test   Possible syncope - there was a period of time he doesn't remember, doesn't remember getting to the ground - continue to monitor on telemetry - pauses on telemetry less than 2.96 sec - consider OP heart monitor   Persistent atrial fibrillation Chronic anticoagulation - rate controlled with coreg - [place] with coumadin - INR 1.7 - asymptomatic - hold coumadin, start heparin drip for possible heart cath - Mg 1.6 --> replace with IV   CAD s/p CABG x 5 2003 - no recent ischemic evaluation - hs troponin 58 --> 78 --> 76 --> 59 - EKG with TWI lateral leads - continue ASA, BB, statin - echo results will guide next steps: heart catheterization vs nuclear stress test   Hypertension - coreg 12.5 mg BID, irbesartan 150 mg daily   Hyperlipidemia with LDL goal < 70 01/20/2021: Cholesterol 106; HDL 24; LDL Cholesterol 68; Triglycerides 70; VLDL 14 - on 10 mg crestor   AKI - sCr 1.26 - baseline unclear   DM - A1c 7.8% - SSI here     Risk Assessment/Risk Scores:   New York Heart Association  (NYHA) Functional Class NYHA Class III  CHA2DS2-VASc Score = 4  This indicates a 4.8% annual risk of stroke. The patient's score is based upon: CHF History: No HTN History: Yes Diabetes History: Yes Stroke History: No Vascular Disease History: Yes Age Score: 1 Gender Score: 0       For questions or updates, please contact [place] Please consult [email or web address] for contact info under    Signed, Ledora Bottcher, [place]  01/20/2021 2:42 PM      Patient seen and examined. Agree with assessment and plan.  [name] is a very pleasant 70 year old gentleman who is a remote patient of Dr. Ron Parker.  He had previously undergone stenting but approximately 1 year later had developed restenosis/occlusion.  In 2003 he underwent CABG x5 revascularization surgery here at [facility] (he cannot recall the surgeon).  He has been followed by Dr. [name] in [place] until his retirement.  He has recently established with a new primary care physician in the same practice.  Patient was seen by Dr. Percival Spanish in 2018 and was in atrial fibrillation at which time he was started on Xarelto.  An echo Doppler study and stress test were recommended but never completed.  Patient saw his primary physician on Monday.  At that time the patient had complained of some increasing fatigue and it was advised that he have a echo Doppler study.  Later that evening, he developed an episode of significant dizziness/vertigo after getting out of his truck.  He denied associated chest pain or awareness of palpitations.  EMS  ultimately arrived and he was transported to [facility].  Troponins were minimally elevated but not suggestive of an acute STEMI.  With his history of increasing dyspnea for the past several months he is now transferred to [facility] for further evaluation.  Patient states he never had previously experienced chest pain but had noticed a vague indigestion sensation when he had his heart attack.  He  denies recent chest discomfort.  He has been on warfarin for anticoagulation.  Apparently he took a dose last night at [facility].  INR today is 1.7.  Presently he is in no acute distress.  He retired 1 month ago.  Blood pressure earlier was 141/78.  We will check orthostatics.  There were no carotid bruits.  There was no JVD.  Lungs were clear.  Rhythm was regular with a 1/6 systolic murmur.  There was no S3 gallop.  Abdomen was soft nontender.  Pulses were 2+.  There was no significant edema.  Chest x-ray did not reveal any acute abnormalities.  BNP was elevated at 1237.  Strahl 68 with low HDL at 24.  Troponins x2 here are 50.  Initial glucose was elevated at 314.  An echo Doppler study was just completed, results are pending but pulmonary review suggest reduction of LV function compared to prior study.  In this patient who is 19 years status post CABG revascularization, I have recommended definitive repeat cardiac catheterization to assess for native CAD as well as graft patency.  We will continue to hold warfarin.  We will need to recheck INR in a.m. to make certain this has not risen since he had received a dose of warfarin last evening.I have reviewed the risks, indications, and alternatives to cardiac catheterization, possible angioplasty, and stenting with the patient. Risks include but are not limited to bleeding, infection, vascular injury, stroke, myocardial infection, arrhythmia, kidney injury, radiation-related injury in the case of prolonged fluoroscopy use, emergency cardiac surgery, and death. The patient understands the risks of serious complication is 1-2 in 5993 with diagnostic cardiac cath and 1-2% or less with angioplasty/stenting.  We will keep n.p.o. in a.m.   Troy Sine, MD, [facility] 01/20/2021 3:03 PM

## 2021-01-20 NOTE — Progress Notes (Signed)
  Echocardiogram 2D Echocardiogram has been performed.  [name] 01/20/2021, 2:05 PM

## 2021-01-20 NOTE — Progress Notes (Signed)
Pt is scheduled for right and left heart cath tomorrow at 10AM with Dr. Gwenlyn Found. NPO at MN please. Will need BMP in AM.

## 2021-01-20 NOTE — Progress Notes (Signed)
PROGRESS NOTE    Thomas GLASHEEN  [name]:[id number]  DOB: 1950/11/03  DOA: 01/19/2021 PCP: [name], MD Outpatient Specialists:   Hospital course:  70 year old man with CAD status post CABG 2003, atrial fibrillation, HTN, DM2 and dyslipidemia was transferred from [facility] after admission for syncope associated with diaphoresis and dizziness.  Troponins were noted to be elevated to a max of 78 so he was transferred to [facility] for cardiac work-up.   Subjective:  Patient states he feels completely well.  Denies any shortness of breath or further dizziness.  Denies any sense of syncope or presyncope.  Notes he has never had anything like that before.   Objective: Vitals:   01/20/21 0323 01/20/21 0358 01/20/21 0700 01/20/21 1001  BP:   110/89 121/76  Pulse: (!) 48 (!) 43 74 62  Resp: (!) 21 (!) 21 17 17   Temp:   98.3 F (36.8 C)   TempSrc:   Oral   SpO2: 95% 95% 93% 93%  Weight:       No intake or output data in the 24 hours ending 01/20/21 1050 Filed Weights   01/19/21 1900  Weight: 85.9 kg     Exam:  General: Friendly well-appearing man sitting up in bed watching TV with son at bedside in no acute distress. Eyes: sclera anicteric, conjuctiva mild injection bilaterally CVS: S1-S2, regular  Respiratory:  decreased air entry bilaterally secondary to decreased inspiratory effort, rales at bases  GI: NABS, soft, NT  LE: No edema.  Neuro: A/O x 3, Moving all extremities equally with normal strength, CN 3-12 intact, grossly nonfocal.  Psych: patient is logical and coherent, judgement and insight appear normal, mood and affect appropriate to situation.   Assessment & Plan:   Syncope associated elevated troponin with subacute DOE and patient with known CAD Appreciate cardiology consultation Plan is for right and left heart cath in the morning He has also undergone brain MRI which is negative for any stroke Continue aspirin and carvedilol  CHF BNP is  1236 Unclear if ventriculogram is planned during cath If not, echocardiogram can be ordered if warranted per cardiology Is presently compensated in terms of fluid status Continue carvedilol and irbesartan  Atrial fibrillation Hold warfarin for left heart cath in the morning Patient started on heparin  Hypomagnesemia We will replete and recheck  HTN Continue carvedilol and irbesartan  DM 2 SSI AC at bedtime Dulaglutide has been continued  S/p COVID x 2 Patient states he has had subacute worsening of DOE since his COVID diagnoses although he is not sure if this is related to that or not.   DVT prophylaxis: Heparin drip at present Code Status: Full Family Communication: Patient's family was at bedside throughout Disposition Plan:   Patient is from: Home  Anticipated Discharge Location: Home  Barriers to Discharge: Awaiting cath in the morning  Is patient medically stable for Discharge: No   Consultants:  Cardiology  Procedures:  For catheterization in the morning  Antimicrobials:  None none   Data Reviewed:  Basic Metabolic Panel: Recent Labs  Lab 01/19/21 2031  NA 132*  K 3.8  CL 99  CO2 26  GLUCOSE 363*  BUN 16  CREATININE 1.26*  CALCIUM 9.0  MG 1.6*   Liver Function Tests: Recent Labs  Lab 01/19/21 2031  AST 17  ALT 19  ALKPHOS 91  BILITOT 1.2  PROT 7.2  ALBUMIN 3.7   No results for input(s): LIPASE, AMYLASE in the last 168 hours. No  results for input(s): AMMONIA in the last 168 hours. CBC: Recent Labs  Lab 01/19/21 2031  WBC 7.5  NEUTROABS 5.4  HGB 12.2*  HCT 38.4*  MCV 94.6  PLT 122*   Cardiac Enzymes: No results for input(s): CKTOTAL, CKMB, CKMBINDEX, TROPONINI in the last 168 hours. BNP (last 3 results) No results for input(s): PROBNP in the last 8760 hours. CBG: Recent Labs  Lab 01/19/21 2122 01/20/21 0539  GLUCAP 314* 176*    Recent Results (from the past 240 hour(s))  MRSA PCR Screening     Status: None    Collection Time: 01/19/21  6:45 PM   Specimen: Nasal Mucosa; Nasopharyngeal  Result Value Ref Range Status   MRSA by PCR NEGATIVE NEGATIVE Final    Comment:        The GeneXpert MRSA Assay (FDA approved for NASAL specimens only), is one component of a comprehensive MRSA colonization surveillance program. It is not intended to diagnose MRSA infection nor to guide or monitor treatment for MRSA infections. Performed at [facility], [place] [address] [place], [place] [address]       Studies: MR BRAIN WO CONTRAST  Result Date: 01/20/2021 CLINICAL DATA:  Initial evaluation for neuro deficit, stroke suspected. EXAM: MRI HEAD WITHOUT CONTRAST TECHNIQUE: Multiplanar, multiecho pulse sequences of the brain and surrounding structures were obtained without intravenous contrast. COMPARISON:  None available. FINDINGS: Brain: Examination degraded by motion artifact. Cerebral volume within normal limits. Patchy T2/FLAIR hyperintensity seen within the periventricular and deep white matter both cerebral hemispheres, most likely related chronic microvascular ischemic disease, moderate in nature. No abnormal foci of restricted diffusion to suggest acute or subacute ischemia. Gray-white matter differentiation maintained. No encephalomalacia to suggest chronic cortical infarction. No evidence for acute or chronic intracranial hemorrhage. No mass lesion, midline shift or mass effect. No hydrocephalus or extra-axial fluid collection. Pituitary gland suprasellar region within normal limits. Midline structures intact. Vascular: Major intracranial vascular flow voids are maintained. Skull and upper cervical spine: Craniocervical junction within normal limits. Bone marrow signal intensity normal. No scalp soft tissue abnormality. Sinuses/Orbits: Patient status post bilateral ocular lens replacement. Globes and orbital soft tissues demonstrate no acute finding. Paranasal sinuses are largely clear. Trace left mastoid  effusion noted, of doubtful significance. Inner ear structures grossly normal. Other: None. IMPRESSION: 1. No acute intracranial abnormality. 2. Moderate chronic microvascular ischemic disease. Electronically Signed   By: Jeannine Boga M.D.   On: 01/20/2021 03:34     Scheduled Meds: . aspirin EC  81 mg Oral Daily  . carvedilol  12.5 mg Oral BID WC  . citalopram  20 mg Oral Daily  . insulin aspart  0-15 Units Subcutaneous TID AC & HS  . irbesartan  150 mg Oral Daily  . [START ON 01/22/2021] rosuvastatin  10 mg Oral Once per day on Mon Fri  . Warfarin - Pharmacist Dosing Inpatient   Does not apply q1600   Continuous Infusions:  Principal Problem:   Vertigo Active Problems:   Elevated troponin   Mixed diabetic hyperlipidemia associated with type 2 diabetes mellitus ([place])   Type 2 diabetes mellitus with hyperglycemia, without long-term current use of insulin ([place])   Chronic atrial fibrillation ([place])   Essential hypertension     Wahid Holley Derek Jack, [facility]  If 7PM-7AM, please contact night-coverage [email or web address]   LOS: 1 day

## 2021-01-21 ENCOUNTER — Inpatient Hospital Stay (HOSPITAL_COMMUNITY)
Admission: AD | Disposition: A | Discharge status: Home / Self Care | Payer: Self-pay | Source: Other Acute Inpatient Hospital | Attending: Internal Medicine

## 2021-01-21 ENCOUNTER — Other Ambulatory Visit (HOSPITAL_COMMUNITY): Payer: Self-pay

## 2021-01-21 DIAGNOSIS — I25810 Atherosclerosis of coronary artery bypass graft(s) without angina pectoris: Secondary | ICD-10-CM

## 2021-01-21 DIAGNOSIS — Z951 Presence of aortocoronary bypass graft: Secondary | ICD-10-CM

## 2021-01-21 DIAGNOSIS — I272 Pulmonary hypertension, unspecified: Secondary | ICD-10-CM

## 2021-01-21 HISTORY — PX: RIGHT/LEFT HEART CATH AND CORONARY/GRAFT ANGIOGRAPHY: CATH118267

## 2021-01-21 LAB — CBC
HCT: 37.1 % — ABNORMAL LOW (ref 39.0–52.0)
Hemoglobin: 11.8 g/dL — ABNORMAL LOW (ref 13.0–17.0)
MCH: 29.6 pg (ref 26.0–34.0)
MCHC: 31.8 g/dL (ref 30.0–36.0)
MCV: 93.2 fL (ref 80.0–100.0)
Platelets: 119 10*3/uL — ABNORMAL LOW (ref 150–400)
RBC: 3.98 MIL/uL — ABNORMAL LOW (ref 4.22–5.81)
RDW: 14.2 % (ref 11.5–15.5)
WBC: 7.2 10*3/uL (ref 4.0–10.5)
nRBC: 0 % (ref 0.0–0.2)

## 2021-01-21 LAB — POCT I-STAT EG7
Acid-Base Excess: 1 mmol/L (ref 0.0–2.0)
Acid-Base Excess: 2 mmol/L (ref 0.0–2.0)
Bicarbonate: 27.6 mmol/L (ref 20.0–28.0)
Bicarbonate: 28.1 mmol/L — ABNORMAL HIGH (ref 20.0–28.0)
Calcium, Ion: 1.2 mmol/L (ref 1.15–1.40)
Calcium, Ion: 1.23 mmol/L (ref 1.15–1.40)
HCT: 35 % — ABNORMAL LOW (ref 39.0–52.0)
HCT: 35 % — ABNORMAL LOW (ref 39.0–52.0)
Hemoglobin: 11.9 g/dL — ABNORMAL LOW (ref 13.0–17.0)
Hemoglobin: 11.9 g/dL — ABNORMAL LOW (ref 13.0–17.0)
O2 Saturation: 66 %
O2 Saturation: 65 %
Potassium: 3.5 mmol/L (ref 3.5–5.1)
Potassium: 3.5 mmol/L (ref 3.5–5.1)
Sodium: 140 mmol/L (ref 135–145)
Sodium: 139 mmol/L (ref 135–145)
TCO2: 29 mmol/L (ref 22–32)
TCO2: 30 mmol/L (ref 22–32)
pCO2, Ven: 50.1 mmHg (ref 44.0–60.0)
pCO2, Ven: 50.7 mmHg (ref 44.0–60.0)
pH, Ven: 7.349 (ref 7.250–7.430)
pH, Ven: 7.351 (ref 7.250–7.430)
pO2, Ven: 36 mmHg (ref 32.0–45.0)
pO2, Ven: 36 mmHg (ref 32.0–45.0)

## 2021-01-21 LAB — POCT I-STAT 7, (LYTES, BLD GAS, ICA,H+H)
Acid-Base Excess: 0 mmol/L (ref 0.0–2.0)
Bicarbonate: 26.2 mmol/L (ref 20.0–28.0)
Calcium, Ion: 1.2 mmol/L (ref 1.15–1.40)
HCT: 34 % — ABNORMAL LOW (ref 39.0–52.0)
Hemoglobin: 11.6 g/dL — ABNORMAL LOW (ref 13.0–17.0)
O2 Saturation: 96 %
Potassium: 3.5 mmol/L (ref 3.5–5.1)
Sodium: 140 mmol/L (ref 135–145)
TCO2: 28 mmol/L (ref 22–32)
pCO2 arterial: 47.7 mmHg (ref 32.0–48.0)
pH, Arterial: 7.348 — ABNORMAL LOW (ref 7.350–7.450)
pO2, Arterial: 88 mmHg (ref 83.0–108.0)

## 2021-01-21 LAB — HEPARIN LEVEL (UNFRACTIONATED): Heparin Unfractionated: 0.16 IU/mL — ABNORMAL LOW (ref 0.30–0.70)

## 2021-01-21 LAB — GLUCOSE, CAPILLARY
Glucose-Capillary: 153 mg/dL — ABNORMAL HIGH (ref 70–99)
Glucose-Capillary: 228 mg/dL — ABNORMAL HIGH (ref 70–99)
Glucose-Capillary: 347 mg/dL — ABNORMAL HIGH (ref 70–99)
Glucose-Capillary: 249 mg/dL — ABNORMAL HIGH (ref 70–99)

## 2021-01-21 LAB — PROTIME-INR
INR: 1.5 — ABNORMAL HIGH (ref 0.8–1.2)
Prothrombin Time: 18.3 seconds — ABNORMAL HIGH (ref 11.4–15.2)

## 2021-01-21 LAB — BASIC METABOLIC PANEL
Anion gap: 11 (ref 5–15)
BUN: 19 mg/dL (ref 8–23)
CO2: 24 mmol/L (ref 22–32)
Calcium: 9.1 mg/dL (ref 8.9–10.3)
Chloride: 99 mmol/L (ref 98–111)
Creatinine, Ser: 1.19 mg/dL (ref 0.61–1.24)
GFR, Estimated: >60 mL/min (ref >60)
Glucose, Bld: 161 mg/dL — ABNORMAL HIGH (ref 70–99)
Potassium: 3.7 mmol/L (ref 3.5–5.1)
Sodium: 134 mmol/L — ABNORMAL LOW (ref 135–145)

## 2021-01-21 LAB — MAGNESIUM: Magnesium: 2 mg/dL (ref 1.7–2.4)

## 2021-01-21 SURGERY — RIGHT/LEFT HEART CATH AND CORONARY/GRAFT ANGIOGRAPHY
Anesthesia: LOCAL

## 2021-01-21 MED ORDER — HEPARIN (PORCINE) IN NACL 1000-0.9 UT/500ML-% IV SOLN
INTRAVENOUS | Status: DC | PRN
  Administered 2021-01-21: 500 mL

## 2021-01-21 MED ORDER — HEPARIN (PORCINE) IN NACL 1000-0.9 UT/500ML-% IV SOLN
INTRAVENOUS | Status: AC
  Filled 2021-01-21: qty 1500

## 2021-01-21 MED ORDER — WARFARIN SODIUM 5 MG PO TABS: 5.0000 mg | ORAL_TABLET | Freq: Once | ORAL | Status: DC

## 2021-01-21 MED ORDER — LIDOCAINE HCL (PF) 1 % IJ SOLN
INTRAMUSCULAR | Status: AC
  Filled 2021-01-21: qty 30

## 2021-01-21 MED ORDER — HEPARIN (PORCINE) 25000 UT/250ML-% IV SOLN: 1500.0000 [IU]/h | INTRAVENOUS | Status: DC

## 2021-01-21 MED ORDER — FUROSEMIDE 10 MG/ML IJ SOLN
40.0000 mg | Freq: Once | INTRAMUSCULAR | Status: AC
  Administered 2021-01-21: 40 mg via INTRAVENOUS
  Filled 2021-01-21: qty 4

## 2021-01-21 MED ORDER — MIDAZOLAM HCL 2 MG/2ML IJ SOLN
INTRAMUSCULAR | Status: DC | PRN
  Administered 2021-01-21: 1 mg via INTRAVENOUS

## 2021-01-21 MED ORDER — FENTANYL CITRATE (PF) 100 MCG/2ML IJ SOLN
INTRAMUSCULAR | Status: DC | PRN
  Administered 2021-01-21: 25 ug via INTRAVENOUS

## 2021-01-21 MED ORDER — LIDOCAINE HCL (PF) 1 % IJ SOLN
INTRAMUSCULAR | Status: DC | PRN
  Administered 2021-01-21: 15 mL

## 2021-01-21 MED ORDER — FENTANYL CITRATE (PF) 100 MCG/2ML IJ SOLN
INTRAMUSCULAR | Status: AC
  Filled 2021-01-21: qty 2

## 2021-01-21 MED ORDER — APIXABAN 5 MG PO TABS
5.0000 mg | ORAL_TABLET | Freq: Two times a day (BID) | ORAL | Status: DC
  Administered 2021-01-21 – 2021-01-22 (×3): 5 mg via ORAL
  Filled 2021-01-21 (×3): qty 1

## 2021-01-21 MED ORDER — WARFARIN - PHARMACIST DOSING INPATIENT: Freq: Every day | Status: DC

## 2021-01-21 MED ORDER — MIDAZOLAM HCL 2 MG/2ML IJ SOLN
INTRAMUSCULAR | Status: AC
  Filled 2021-01-21: qty 2

## 2021-01-21 MED ORDER — IOHEXOL 350 MG/ML SOLN
INTRAVENOUS | Status: DC | PRN
  Administered 2021-01-21: 60 mL

## 2021-01-21 SURGICAL SUPPLY — 14 items
CATH INFINITI 5FR MULTPACK ANG (CATHETERS) ×1 IMPLANT
CATH SWAN GANZ 7F STRAIGHT (CATHETERS) ×1 IMPLANT
CLOSURE MYNX CONTROL 5F (Vascular Products) ×1 IMPLANT
CLOSURE MYNX CONTROL 6F/7F (Vascular Products) ×1 IMPLANT
GUIDEWIRE .025 260CM (WIRE) ×1 IMPLANT
KIT HEART LEFT (KITS) ×2 IMPLANT
PACK CARDIAC CATHETERIZATION (CUSTOM PROCEDURE TRAY) ×2 IMPLANT
SHEATH PINNACLE 5F 10CM (SHEATH) ×1 IMPLANT
SHEATH PINNACLE 7F 10CM (SHEATH) ×1 IMPLANT
SHEATH PROBE COVER 6X72 (BAG) ×1 IMPLANT
TRANSDUCER W/STOPCOCK (MISCELLANEOUS) ×2 IMPLANT
TUBING CIL FLEX 10 FLL-RA (TUBING) ×2 IMPLANT
WIRE EMERALD 3MM-J .025X260CM (WIRE) ×1 IMPLANT
WIRE EMERALD 3MM-J .035X150CM (WIRE) ×1 IMPLANT

## 2021-01-21 NOTE — Discharge Instructions (Signed)

## 2021-01-21 NOTE — TOC Benefit Eligibility Note (Signed)
Patient Teacher, English as a foreign language completed.    The patient is currently admitted and upon discharge could be taking Eliquis 5 mg.  The current 30 day co-pay is, $47.00.   The patient is currently admitted and upon discharge could be taking Xarelto 20 mg.  The current 30 day co-pay is, $47.00.   The patient is currently admitted and upon discharge could be taking Farxiga 10 mg.  The current 30 day co-pay is, $47.00.   The patient is insured through [place], [place] Patient Advocate Specialist [place] Team Direct Number: [phone]  Fax: [phone]

## 2021-01-21 NOTE — Progress Notes (Signed)
ANTICOAGULATION CONSULT NOTE - Follow Up Consult  Pharmacy Consult for heparin Indication: Afib and r/o ACS  Labs: Recent Labs    01/19/21 2031 01/20/21 0026 01/20/21 0236 01/20/21 0821 01/20/21 1439 01/20/21 2216 01/21/21 0428  HGB 12.2*  --   --   --   --   --  11.8*  HCT 38.4*  --   --   --   --   --  37.1*  PLT 122*  --   --   --   --   --  119*  APTT 39*  --   --   --   --   --   --   LABPROT 19.7* 19.6*  --   --   --   --  18.3*  INR 1.7* 1.7*  --   --   --   --  1.5*  HEPARINUNFRC  --   --   --   --   --  <0.10* 0.16*  CREATININE 1.26*  --   --   --   --   --  1.19  TROPONINIHS  --   --  50* 50* 42*  --   --     Assessment: 70yo male subtherapeutic on heparin with initial dosing while Coumadin on hold; no gtt issues or signs of bleeding per RN.  Goal of Therapy:  Heparin level 0.3-0.7 units/ml   Plan:  Will increase heparin gtt by 3 units/kg/hr to 1500 units/hr and f/u after cath.    Wynona Neat, PharmD, BCPS  01/21/2021,5:29 AM

## 2021-01-21 NOTE — Plan of Care (Signed)
  Problem: Education: Goal: Knowledge of General Education information will improve Description: Including pain rating scale, medication(s)/side effects and non-pharmacologic comfort measures Outcome: Progressing   Problem: Health Behavior/Discharge Planning: Goal: Ability to manage health-related needs will improve Outcome: Progressing   Problem: Clinical Measurements: Goal: Ability to maintain clinical measurements within normal limits will improve Outcome: Progressing   Problem: Clinical Measurements: Goal: Diagnostic test results will improve Outcome: Progressing   Problem: Clinical Measurements: Goal: Cardiovascular complication will be avoided Outcome: Progressing   Problem: Activity: Goal: Risk for activity intolerance will decrease Outcome: Progressing   Problem: Nutrition: Goal: Adequate nutrition will be maintained Outcome: Progressing   Problem: Pain Managment: Goal: General experience of comfort will improve Outcome: Progressing   Problem: Skin Integrity: Goal: Risk for impaired skin integrity will decrease Outcome: Progressing   

## 2021-01-21 NOTE — Interval H&P Note (Signed)
Cath Lab Visit (complete for each Cath Lab visit)  Clinical Evaluation Leading to the Procedure:   ACS: No.  Non-ACS:    Anginal Classification: No Symptoms  Anti-ischemic medical therapy: Minimal Therapy (1 class of medications)  Non-Invasive Test Results: No non-invasive testing performed  Prior CABG: Previous CABG      History and Physical Interval Note:  01/21/2021 8:57 AM  [name]  has presented today for surgery, with the diagnosis of chest pain.  The various methods of treatment have been discussed with the patient and family. After consideration of risks, benefits and other options for treatment, the patient has consented to  Procedure(s): RIGHT/LEFT HEART CATH AND CORONARY/GRAFT ANGIOGRAPHY (N/A) as a surgical intervention.  The patient's history has been reviewed, patient examined, no change in status, stable for surgery.  I have reviewed the patient's chart and labs.  Questions were answered to the patient's satisfaction.     Quay Burow

## 2021-01-21 NOTE — Progress Notes (Addendum)
PROGRESS NOTE    ETHERIDGE WHITMARSH  [id number]  DOB: November 23, 1950  DOA: 01/19/2021 PCP: [name], MD Outpatient Specialists:   Hospital course:  70 year old man with CAD status post CABG 2003, atrial fibrillation, HTN, DM2 and dyslipidemia was transferred from [facility] after admission for syncope associated with diaphoresis and dizziness.  Troponins were noted to be elevated to a max of 78 so he was transferred to [facility] for cardiac work-up.  Patient underwent right and left cardiac catheterization today, which shows stenosis of 2 out of 5 grafts.  No new intervention done, cardiology is advising maximization of medical management. Having mild pulmonary hypertension. Patient continued to have borderline bradycardia with some pauses-can be contributed to his syncopal episode, per cardiology note he might need EP evaluation. Cardiology wants to keep him for another day or so for more IV diuresis and a possible EP evaluation.  Patient will be switched to Eliquis for anticoagulation instead of Coumadin now.  Subjective: Patient was seen and examined after getting cardiac catheterization today.  Denies any chest pain or shortness of breath.  No dizziness.  Wife at bedside.  Objective: Vitals:   01/21/21 0952 01/21/21 1014 01/21/21 1035 01/21/21 1106  BP: 121/70 129/64 137/80 134/77  Pulse: 60 68 63 60  Resp: (!) 21 17 17 20   Temp:      TempSrc:      SpO2: 93% 94% 92% 95%  Weight:      Height:        Intake/Output Summary (Last 24 hours) at 01/21/2021 1406 Last data filed at 01/21/2021 0600 Gross per 24 hour  Intake 504.15 ml  Output 450 ml  Net 54.15 ml   Filed Weights   01/19/21 1900 01/21/21 0300  Weight: 85.9 kg 88.3 kg     Exam:  General.  Well-developed gentleman, in no acute distress. Pulmonary.  Lungs clear bilaterally, normal respiratory effort. CV.  Regular rate and rhythm, no JVD, rub or murmur. Abdomen.  Soft, nontender, nondistended, BS  positive. CNS.  Alert and oriented x3.  No focal neurologic deficit. Extremities.  No edema, no cyanosis, pulses intact and symmetrical. Psychiatry.  Judgment and insight appears normal.  Assessment & Plan:   Syncope associated elevated troponin with subacute DOE and patient with known CAD Appreciate cardiology consultation-underwent right and left cardiac catheterization which shows stenosis of 2 out of 5 grafts.  No intervention done.  Plan is to maximize medical management. Patient might need EP evaluation as heart rate remained borderline low with some pauses which can be contributory to his syncopal episode. He has also undergone brain MRI which is negative for any stroke -Continue aspirin and carvedilol  Acute on chronic diastolic heart failure. BNP is 1236 Patient had mildly elevated pulmonary pressures and cardiology is recommending continuation of IV diuresis. -Received 1 dose of IV Lasix today. -Continue carvedilol and irbesartan  Atrial fibrillation Patient was on Coumadin prior to this hospitalization secondary to cost.  Now agreed upon taking Eliquis. -Switch him to Eliquis per pharmacy.  Hypomagnesemia.  Resolved Monitor and replete as needed  HTN Continue carvedilol and irbesartan  DM 2 SSI AC at bedtime Dulaglutide has been continued  S/p COVID x 2 Patient states he has had subacute worsening of DOE since his COVID diagnoses although he is not sure if this is related to that or not.   DVT prophylaxis: Heparin drip at present Code Status: Full Family Communication: Discussed with wife at bedside Disposition Plan:   Patient is  from: Home  Anticipated Discharge Location: Home  Barriers to Discharge: Might need EP evaluation.  Is patient medically stable for Discharge: No   Consultants:  Cardiology  Procedures: Cardiac catheterization  Antimicrobials:  None none   Data Reviewed:  Basic Metabolic Panel: Recent Labs  Lab 01/19/21 2031  01/21/21 0428 01/21/21 0919 01/21/21 0923  NA 132* 134* 139  140 140  K 3.8 3.7 3.5  3.5 3.5  CL 99 99  --   --   CO2 26 24  --   --   GLUCOSE 363* 161*  --   --   BUN 16 19  --   --   CREATININE 1.26* 1.19  --   --   CALCIUM 9.0 9.1  --   --   MG 1.6* 2.0  --   --    Liver Function Tests: Recent Labs  Lab 01/19/21 2031  AST 17  ALT 19  ALKPHOS 91  BILITOT 1.2  PROT 7.2  ALBUMIN 3.7   No results for input(s): LIPASE, AMYLASE in the last 168 hours. No results for input(s): AMMONIA in the last 168 hours. CBC: Recent Labs  Lab 01/19/21 2031 01/21/21 0428 01/21/21 0919 01/21/21 0923  WBC 7.5 7.2  --   --   NEUTROABS 5.4  --   --   --   HGB 12.2* 11.8* 11.9*  11.9* 11.6*  HCT 38.4* 37.1* 35.0*  35.0* 34.0*  MCV 94.6 93.2  --   --   PLT 122* 119*  --   --    Cardiac Enzymes: No results for input(s): CKTOTAL, CKMB, CKMBINDEX, TROPONINI in the last 168 hours. BNP (last 3 results) No results for input(s): PROBNP in the last 8760 hours. CBG: Recent Labs  Lab 01/20/21 1201 01/20/21 1611 01/20/21 2111 01/21/21 0540 01/21/21 1108  GLUCAP 214* 355* 218* 153* 228*    Recent Results (from the past 240 hour(s))  MRSA PCR Screening     Status: None   Collection Time: 01/19/21  6:45 PM   Specimen: Nasal Mucosa; Nasopharyngeal  Result Value Ref Range Status   MRSA by PCR NEGATIVE NEGATIVE Final    Comment:        The GeneXpert MRSA Assay (FDA approved for NASAL specimens only), is one component of a comprehensive MRSA colonization surveillance program. It is not intended to diagnose MRSA infection nor to guide or monitor treatment for MRSA infections. Performed at [facility], [place] [address] [place], [place] [address]       Studies: MR BRAIN WO CONTRAST  Result Date: 01/20/2021 CLINICAL DATA:  Initial evaluation for neuro deficit, stroke suspected. EXAM: MRI HEAD WITHOUT CONTRAST TECHNIQUE: Multiplanar, multiecho pulse sequences of the brain and  surrounding structures were obtained without intravenous contrast. COMPARISON:  None available. FINDINGS: Brain: Examination degraded by motion artifact. Cerebral volume within normal limits. Patchy T2/FLAIR hyperintensity seen within the periventricular and deep white matter both cerebral hemispheres, most likely related chronic microvascular ischemic disease, moderate in nature. No abnormal foci of restricted diffusion to suggest acute or subacute ischemia. Gray-white matter differentiation maintained. No encephalomalacia to suggest chronic cortical infarction. No evidence for acute or chronic intracranial hemorrhage. No mass lesion, midline shift or mass effect. No hydrocephalus or extra-axial fluid collection. Pituitary gland suprasellar region within normal limits. Midline structures intact. Vascular: Major intracranial vascular flow voids are maintained. Skull and upper cervical spine: Craniocervical junction within normal limits. Bone marrow signal intensity normal. No scalp soft tissue abnormality. Sinuses/Orbits:  Patient status post bilateral ocular lens replacement. Globes and orbital soft tissues demonstrate no acute finding. Paranasal sinuses are largely clear. Trace left mastoid effusion noted, of doubtful significance. Inner ear structures grossly normal. Other: None. IMPRESSION: 1. No acute intracranial abnormality. 2. Moderate chronic microvascular ischemic disease. Electronically Signed   By: Jeannine Boga M.D.   On: 01/20/2021 03:34   CARDIAC CATHETERIZATION  Result Date: 01/21/2021  Ost LAD to Prox LAD lesion is 100% stenosed.  Ramus lesion is 100% stenosed.  2nd Mrg lesion is 80% stenosed.  Prox RCA to Mid RCA lesion is 60% stenosed.  Mid RCA to Dist RCA lesion is 90% stenosed.  Origin to Mid Graft lesion before Ramus is 100% stenosed.  RPDA lesion is 100% stenosed.  2nd RPL lesion is 100% stenosed.  DICKSON KOSTELNIK is a 70 y.o. male  [id number] LOCATION:  FACILITY: [place]  PHYSICIAN: Quay Burow, M.D. Apr 08, 1951 DATE OF PROCEDURE:  01/21/2021 DATE OF DISCHARGE: CARDIAC CATHETERIZATION History obtained from chart review.DARELLE KINGS is a 70 y.o. male with a hx of Afib, CAD s/p CABG x 5 in 2003, DM, HLD, and HTN who is being seen 01/20/2021 for the evaluation of chest pain at the request of Dr. [name].  His cardiac enzymes were mildly elevated but flat, his EKG showed A. fib but no acute changes.  His EF by 2D echo was 50%.  Since its been 9 years since his CABG and he presented with heart failure it was elected to perform right left heart cath with limited contrast.   Mr. Mcpartland has 3 out of 5 grafts patent.  The sequential vein to the ramus branch and obtuse marginal branch was occluded at its origin.  Sequential vein to the PDA and PLA was widely patent as was the LIMA to the LAD.  The filling pressures were mildly elevated as well as the PA pressure suggesting mild to moderate pulmonary hypertension.  Patient would benefit from additional diuresis.  Mynx closure devices were successfully deployed in the artery and vein.  The patient left lab in stable condition.  Coumadin will be restarted per pharmacy protocol for his persistent A. fib. Quay Burow. MD, [facility] 01/21/2021 9:55 AM   ECHOCARDIOGRAM COMPLETE  Result Date: 01/20/2021    ECHOCARDIOGRAM REPORT   Patient Name:   KINDRICK LANKFORD Date of Exam: 01/20/2021 Medical Rec #:  [id number]       Height:       69.0 in Accession #:    [id number]      Weight:       189.4 lb Date of Birth:  September 14, 1950       BSA:          2.019 m Patient Age:    2 years        BP:           110/89 mmHg Patient Gender: M               HR:           74 bpm. Exam Location:  Inpatient Procedure: 2D Echo, Cardiac Doppler and Color Doppler Indications:    Chest pain  History:        Patient has prior history of Echocardiogram examinations, most                 recent 03/22/2017. CAD, Prior CABG, Arrythmias:Atrial                 Fibrillation; Risk  Factors:Diabetes and Hypertension.  Sonographer:    Cammy Brochure Referring Phys: [id number] [place]  1. Left ventricular ejection fraction, by estimation, is 50%. The left ventricle has mildly decreased function. The left ventricle demonstrates regional wall motion abnormalities consistent with apical hypokinesis. There is mild concentric left ventricular hypertrophy. Left ventricular diastolic parameters are indeterminate, though tissue Doppler evidence of elevated left atrial pressure.  2. Right ventricular systolic function is mildly to moderately reduced. The right ventricular size is moderately enlarged. There is moderately elevated pulmonary artery systolic pressure. The estimated right ventricular systolic pressure is 62.1 mmHg.  3. Left atrial size was mildly dilated.  4. The mitral valve is grossly normal. Mild mitral valve regurgitation. No evidence of mitral stenosis.  5. Tricuspid valve regurgitation is mild to moderate and functional.  6. The aortic valve is tricuspid. Aortic valve regurgitation is not visualized. No aortic stenosis is present.  7. The inferior vena cava is normal in size with <50% respiratory variability, suggesting right atrial pressure of 8 mmHg. Comparison(s): A prior study was performed on 03/22/2017. Prior images reviewed side by side. Compared to prior study, mitral regurgitation is less prominent and tricuspid regurgitation appears worse. FINDINGS  Left Ventricle: Left ventricular ejection fraction, by estimation, is 50%. The left ventricle has mildly decreased function. The left ventricle demonstrates regional wall motion abnormalities. The left ventricular internal cavity size was normal in size. There is mild concentric left ventricular hypertrophy. Left ventricular diastolic parameters are indeterminate. Elevated left atrial pressure.  LV Wall Scoring: The entire apex is hypokinetic. Right Ventricle: The right ventricular size is moderately enlarged.  No increase in right ventricular wall thickness. Right ventricular systolic function is mildly reduced. There is moderately elevated pulmonary artery systolic pressure. The tricuspid regurgitant velocity is 3.43 m/s, and with an assumed right atrial pressure of 8 mmHg, the estimated right ventricular systolic pressure is 30.8 mmHg. Left Atrium: Left atrial size was mildly dilated. Right Atrium: Right atrial size was normal in size. Pericardium: There is no evidence of pericardial effusion. Mitral Valve: The mitral valve is grossly normal. Mild mitral valve regurgitation. No evidence of mitral valve stenosis. Tricuspid Valve: The tricuspid valve is normal in structure. Tricuspid valve regurgitation is mild to moderate. Aortic Valve: The aortic valve is tricuspid. Aortic valve regurgitation is not visualized. No aortic stenosis is present. Aortic valve mean gradient measures 3.0 mmHg. Aortic valve peak gradient measures 6.0 mmHg. Aortic valve area, by VTI measures 3.09 cm. Pulmonic Valve: The pulmonic valve was not well visualized. Pulmonic valve regurgitation is mild. No evidence of pulmonic stenosis. Aorta: The aortic root and ascending aorta are structurally normal, with no evidence of dilitation. Venous: The inferior vena cava is normal in size with less than 50% respiratory variability, suggesting right atrial pressure of 8 mmHg. IAS/Shunts: The atrial septum is grossly normal.  LEFT VENTRICLE PLAX 2D LVIDd:         5.10 cm LVIDs:         3.80 cm LV PW:         1.10 cm LV IVS:        1.20 cm LVOT diam:     2.10 cm LV SV:         67 LV SV Index:   33 LVOT Area:     3.46 cm  RIGHT VENTRICLE            IVC RV Basal diam:  5.40 cm    IVC diam: 2.00  cm RV S prime:     7.43 cm/s LEFT ATRIUM             Index       RIGHT ATRIUM           Index LA diam:        4.20 cm 2.08 cm/m  RA Area:     21.10 cm LA Vol (A2C):   74.9 ml 37.10 ml/m RA Volume:   59.80 ml  29.62 ml/m LA Vol (A4C):   71.4 ml 35.37 ml/m LA Biplane  Vol: 75.1 ml 37.20 ml/m  AORTIC VALVE AV Area (Vmax):    3.04 cm AV Area (Vmean):   2.87 cm AV Area (VTI):     3.09 cm AV Vmax:           122.00 cm/s AV Vmean:          79.900 cm/s AV VTI:            0.216 m AV Peak Grad:      6.0 mmHg AV Mean Grad:      3.0 mmHg LVOT Vmax:         107.00 cm/s LVOT Vmean:        66.200 cm/s LVOT VTI:          0.193 m LVOT/AV VTI ratio: 0.89  AORTA Ao Root diam: 3.60 cm MITRAL VALVE                TRICUSPID VALVE MV Area (PHT): 4.68 cm     TR Peak grad:   47.1 mmHg MV Decel Time: 162 msec     TR Vmax:        343.00 cm/s MV E velocity: 112.00 cm/s                             SHUNTS                             Systemic VTI:  0.19 m                             Systemic Diam: 2.10 cm Rudean Haskell MD Electronically signed by Rudean Haskell MD Signature Date/Time: 01/20/2021/5:08:36 PM    Final      Scheduled Meds: . apixaban  5 mg Oral BID  . aspirin EC  81 mg Oral Daily  . carvedilol  12.5 mg Oral BID WC  . insulin aspart  0-15 Units Subcutaneous TID AC & HS  . irbesartan  150 mg Oral Daily  . [START ON 01/22/2021] rosuvastatin  10 mg Oral Once per day on Mon Fri   Continuous Infusions:  Principal Problem:   Postural dizziness with presyncope Active Problems:   Elevated troponin   Mixed diabetic hyperlipidemia associated with type 2 diabetes mellitus ([place])   Type 2 diabetes mellitus with hyperglycemia, without long-term current use of insulin (HCC)   Chronic atrial fibrillation (HCC)   Essential hypertension   Coronary artery disease involving native coronary artery of native heart without angina pectoris   Anticoagulated on warfarin   Pulmonary hypertension, unspecified (HCC)     Lorella Nimrod, MD [facility]  This record has been created using Systems analyst. Errors have been sought and corrected,but may not always be located. Such creation errors do not reflect on the standard of care.  If 7PM-7AM, please  contact night-coverage [email or web address]   LOS: 2 days

## 2021-01-21 NOTE — Progress Notes (Signed)
Heart Failure Nurse Navigator Progress Note  Pt admission appears related more to CAD and AF, no needs for HV TOC at this time.   Navigator available for HF resources/education as needed.  Pricilla Holm, RN, BSN Heart Failure Nurse Navigator [phone]

## 2021-01-21 NOTE — Progress Notes (Addendum)
Progress Note  Patient Name: [name] Date of Encounter: 01/21/2021  [facility] Cardiologist: None   Subjective   Back from cath this morning. No chest pain. Wife at the bedside.   Inpatient Medications    Scheduled Meds: . aspirin EC  81 mg Oral Daily  . carvedilol  12.5 mg Oral BID WC  . furosemide  40 mg Intravenous Once  . insulin aspart  0-15 Units Subcutaneous TID AC & HS  . irbesartan  150 mg Oral Daily  . [START ON 01/22/2021] rosuvastatin  10 mg Oral Once per day on Mon Fri  . warfarin  5 mg Oral ONCE-1600  . Warfarin - Pharmacist Dosing Inpatient   Does not apply q1600   Continuous Infusions: . heparin     PRN Meds: acetaminophen, meclizine, nitroGLYCERIN, ondansetron (ZOFRAN) IV   Vital Signs    Vitals:   01/21/21 0952 01/21/21 1014 01/21/21 1035 01/21/21 1106  BP: 121/70 129/64 137/80 134/77  Pulse: 60 68 63 60  Resp: (!) 21 17 17 20   Temp:      TempSrc:      SpO2: 93% 94% 92% 95%  Weight:      Height:        Intake/Output Summary (Last 24 hours) at 01/21/2021 1119 Last data filed at 01/21/2021 0600 Gross per 24 hour  Intake 504.15 ml  Output 450 ml  Net 54.15 ml   Last 3 Weights 01/21/2021 01/19/2021 03/01/2017  Weight (lbs) 194 lb 10.7 oz 189 lb 6 oz 165 lb 6.4 oz  Weight (kg) 88.3 kg 85.9 kg 75.025 kg      Telemetry    Afib rates 50-60s, several pauses noted, < 3 secs - Personally Reviewed  ECG    No new tracing  Physical Exam  Pleasant male, laying in bed GEN: No acute distress.   Neck: No JVD Cardiac: Irreg Irreg, no murmurs, rubs, or gallops.  Respiratory: Clear to auscultation bilaterally. GI: Soft, nontender, non-distended  MS: No edema; No deformity. Right groin stable (mynx closure)  Neuro:  Nonfocal  Psych: Normal affect   Labs    High Sensitivity Troponin:   Recent Labs  Lab 01/20/21 0236 01/20/21 0821 01/20/21 1439  TROPONINIHS 50* 50* 42*      Chemistry Recent Labs  Lab 01/19/21 2031  01/21/21 0428 01/21/21 0919 01/21/21 0923  NA 132* 134* 140 140  K 3.8 3.7 3.5 3.5  CL 99 99  --   --   CO2 26 24  --   --   GLUCOSE 363* 161*  --   --   BUN 16 19  --   --   CREATININE 1.26* 1.19  --   --   CALCIUM 9.0 9.1  --   --   PROT 7.2  --   --   --   ALBUMIN 3.7  --   --   --   AST 17  --   --   --   ALT 19  --   --   --   ALKPHOS 91  --   --   --   BILITOT 1.2  --   --   --   GFRNONAA >60 >60  --   --   ANIONGAP 7 11  --   --      Hematology Recent Labs  Lab 01/19/21 2031 01/21/21 0428 01/21/21 0919 01/21/21 0923  WBC 7.5 7.2  --   --   RBC 4.06* 3.98*  --   --  HGB 12.2* 11.8* 11.9* 11.6*  HCT 38.4* 37.1* 35.0* 34.0*  MCV 94.6 93.2  --   --   MCH 30.0 29.6  --   --   MCHC 31.8 31.8  --   --   RDW 14.2 14.2  --   --   PLT 122* 119*  --   --     BNP Recent Labs  Lab 01/19/21 2031  BNP 1,236.8*     DDimer  Recent Labs  Lab 01/19/21 2031  DDIMER 0.40     Radiology    MR BRAIN WO CONTRAST  Result Date: 01/20/2021 CLINICAL DATA:  Initial evaluation for neuro deficit, stroke suspected. EXAM: MRI HEAD WITHOUT CONTRAST TECHNIQUE: Multiplanar, multiecho pulse sequences of the brain and surrounding structures were obtained without intravenous contrast. COMPARISON:  None available. FINDINGS: Brain: Examination degraded by motion artifact. Cerebral volume within normal limits. Patchy T2/FLAIR hyperintensity seen within the periventricular and deep white matter both cerebral hemispheres, most likely related chronic microvascular ischemic disease, moderate in nature. No abnormal foci of restricted diffusion to suggest acute or subacute ischemia. Gray-white matter differentiation maintained. No encephalomalacia to suggest chronic cortical infarction. No evidence for acute or chronic intracranial hemorrhage. No mass lesion, midline shift or mass effect. No hydrocephalus or extra-axial fluid collection. Pituitary gland suprasellar region within normal limits.  Midline structures intact. Vascular: Major intracranial vascular flow voids are maintained. Skull and upper cervical spine: Craniocervical junction within normal limits. Bone marrow signal intensity normal. No scalp soft tissue abnormality. Sinuses/Orbits: Patient status post bilateral ocular lens replacement. Globes and orbital soft tissues demonstrate no acute finding. Paranasal sinuses are largely clear. Trace left mastoid effusion noted, of doubtful significance. Inner ear structures grossly normal. Other: None. IMPRESSION: 1. No acute intracranial abnormality. 2. Moderate chronic microvascular ischemic disease. Electronically Signed   By: Jeannine Boga M.D.   On: 01/20/2021 03:34   CARDIAC CATHETERIZATION  Result Date: 01/21/2021  Ost LAD to Prox LAD lesion is 100% stenosed.  Ramus lesion is 100% stenosed.  2nd Mrg lesion is 80% stenosed.  Prox RCA to Mid RCA lesion is 60% stenosed.  Mid RCA to Dist RCA lesion is 90% stenosed.  Origin to Mid Graft lesion before Ramus is 100% stenosed.  RPDA lesion is 100% stenosed.  2nd RPL lesion is 100% stenosed.  [name] is a 70 y.o. male  ZL:[id number] LOCATION:  FACILITY: [facility] PHYSICIAN: [name], M.D. 1951-08-13 DATE OF PROCEDURE:  01/21/2021 DATE OF DISCHARGE: CARDIAC CATHETERIZATION History obtained from chart review.[name] is a 70 y.o. male with a hx of Afib, CAD s/p CABG x 5 in 2003, DM, HLD, and HTN who is being seen 01/20/2021 for the evaluation of chest pain at the request of [name].  His cardiac enzymes were mildly elevated but flat, his EKG showed A. fib but no acute changes.  His EF by 2D echo was 50%.  Since its been 9 years since his CABG and he presented with heart failure it was elected to perform right left heart cath with limited contrast.   [name] has 3 out of 5 grafts patent.  The sequential vein to the ramus branch and obtuse marginal branch was occluded at its origin.  Sequential vein to the PDA and PLA  was widely patent as was the LIMA to the LAD.  The filling pressures were mildly elevated as well as the PA pressure suggesting mild to moderate pulmonary hypertension.  Patient would benefit from additional diuresis.  Mynx closure devices were successfully deployed in the artery and vein.  The patient left lab in stable condition.  Coumadin will be restarted per pharmacy protocol for his persistent A. fib. [name]. MD, [facility] 01/21/2021 9:55 AM   ECHOCARDIOGRAM COMPLETE  Result Date: 01/20/2021    ECHOCARDIOGRAM REPORT   Patient Name:   KIN GALBRAITH Date of Exam: 01/20/2021 Medical Rec #:  [id number]       Height:       69.0 in Accession #:    [id number]      Weight:       189.4 lb Date of Birth:  [birth year]       BSA:          2.019 m Patient Age:    70 years        BP:           110/89 mmHg Patient Gender: M               HR:           74 bpm. Exam Location:  Inpatient Procedure: 2D Echo, Cardiac Doppler and Color Doppler Indications:    Chest pain  History:        Patient has prior history of Echocardiogram examinations, most                 recent 03/22/2017. CAD, Prior CABG, Arrythmias:Atrial                 Fibrillation; Risk Factors:Diabetes and Hypertension.  Sonographer:    Cammy Brochure Referring Phys: [id number] [place]  1. Left ventricular ejection fraction, by estimation, is 50%. The left ventricle has mildly decreased function. The left ventricle demonstrates regional wall motion abnormalities consistent with apical hypokinesis. There is mild concentric left ventricular hypertrophy. Left ventricular diastolic parameters are indeterminate, though tissue Doppler evidence of elevated left atrial pressure.  2. Right ventricular systolic function is mildly to moderately reduced. The right ventricular size is moderately enlarged. There is moderately elevated pulmonary artery systolic pressure. The estimated right ventricular systolic pressure is 50.3 mmHg.  3. Left atrial size  was mildly dilated.  4. The mitral valve is grossly normal. Mild mitral valve regurgitation. No evidence of mitral stenosis.  5. Tricuspid valve regurgitation is mild to moderate and functional.  6. The aortic valve is tricuspid. Aortic valve regurgitation is not visualized. No aortic stenosis is present.  7. The inferior vena cava is normal in size with <50% respiratory variability, suggesting right atrial pressure of 8 mmHg. Comparison(s): A prior study was performed on 03/22/2017. Prior images reviewed side by side. Compared to prior study, mitral regurgitation is less prominent and tricuspid regurgitation appears worse. FINDINGS  Left Ventricle: Left ventricular ejection fraction, by estimation, is 50%. The left ventricle has mildly decreased function. The left ventricle demonstrates regional wall motion abnormalities. The left ventricular internal cavity size was normal in size. There is mild concentric left ventricular hypertrophy. Left ventricular diastolic parameters are indeterminate. Elevated left atrial pressure.  LV Wall Scoring: The entire apex is hypokinetic. Right Ventricle: The right ventricular size is moderately enlarged. No increase in right ventricular wall thickness. Right ventricular systolic function is mildly reduced. There is moderately elevated pulmonary artery systolic pressure. The tricuspid regurgitant velocity is 3.43 m/s, and with an assumed right atrial pressure of 8 mmHg, the estimated right ventricular systolic pressure is 54.6 mmHg. Left Atrium: Left atrial size was mildly dilated. Right Atrium: Right atrial  size was normal in size. Pericardium: There is no evidence of pericardial effusion. Mitral Valve: The mitral valve is grossly normal. Mild mitral valve regurgitation. No evidence of mitral valve stenosis. Tricuspid Valve: The tricuspid valve is normal in structure. Tricuspid valve regurgitation is mild to moderate. Aortic Valve: The aortic valve is tricuspid. Aortic valve  regurgitation is not visualized. No aortic stenosis is present. Aortic valve mean gradient measures 3.0 mmHg. Aortic valve peak gradient measures 6.0 mmHg. Aortic valve area, by VTI measures 3.09 cm. Pulmonic Valve: The pulmonic valve was not well visualized. Pulmonic valve regurgitation is mild. No evidence of pulmonic stenosis. Aorta: The aortic root and ascending aorta are structurally normal, with no evidence of dilitation. Venous: The inferior vena cava is normal in size with less than 50% respiratory variability, suggesting right atrial pressure of 8 mmHg. IAS/Shunts: The atrial septum is grossly normal.  LEFT VENTRICLE PLAX 2D LVIDd:         5.10 cm LVIDs:         3.80 cm LV PW:         1.10 cm LV IVS:        1.20 cm LVOT diam:     2.10 cm LV SV:         67 LV SV Index:   33 LVOT Area:     3.46 cm  RIGHT VENTRICLE            IVC RV Basal diam:  5.40 cm    IVC diam: 2.00 cm RV S prime:     7.43 cm/s LEFT ATRIUM             Index       RIGHT ATRIUM           Index LA diam:        4.20 cm 2.08 cm/m  RA Area:     21.10 cm LA Vol (A2C):   74.9 ml 37.10 ml/m RA Volume:   59.80 ml  29.62 ml/m LA Vol (A4C):   71.4 ml 35.37 ml/m LA Biplane Vol: 75.1 ml 37.20 ml/m  AORTIC VALVE AV Area (Vmax):    3.04 cm AV Area (Vmean):   2.87 cm AV Area (VTI):     3.09 cm AV Vmax:           122.00 cm/s AV Vmean:          79.900 cm/s AV VTI:            0.216 m AV Peak Grad:      6.0 mmHg AV Mean Grad:      3.0 mmHg LVOT Vmax:         107.00 cm/s LVOT Vmean:        66.200 cm/s LVOT VTI:          0.193 m LVOT/AV VTI ratio: 0.89  AORTA Ao Root diam: 3.60 cm MITRAL VALVE                TRICUSPID VALVE MV Area (PHT): 4.68 cm     TR Peak grad:   47.1 mmHg MV Decel Time: 162 msec     TR Vmax:        343.00 cm/s MV E velocity: 112.00 cm/s                             SHUNTS  Systemic VTI:  0.19 m                             Systemic Diam: 2.10 cm Rudean Haskell MD Electronically signed by Rudean Haskell MD Signature Date/Time: 01/20/2021/5:08:36 PM    Final     Cardiac Studies   Echo: 01/20/21   IMPRESSIONS    1. Left ventricular ejection fraction, by estimation, is 50%. The left  ventricle has mildly decreased function. The left ventricle demonstrates  regional wall motion abnormalities consistent with apical hypokinesis.  There is mild concentric left  ventricular hypertrophy. Left ventricular diastolic parameters are  indeterminate, though tissue Doppler evidence of elevated left atrial  pressure.  2. Right ventricular systolic function is mildly to moderately reduced.  The right ventricular size is moderately enlarged. There is moderately  elevated pulmonary artery systolic pressure. The estimated right  ventricular systolic pressure is 26.9 mmHg.  3. Left atrial size was mildly dilated.  4. The mitral valve is grossly normal. Mild mitral valve regurgitation.  No evidence of mitral stenosis.  5. Tricuspid valve regurgitation is mild to moderate and functional.  6. The aortic valve is tricuspid. Aortic valve regurgitation is not  visualized. No aortic stenosis is present.  7. The inferior vena cava is normal in size with <50% respiratory  variability, suggesting right atrial pressure of 8 mmHg.   Comparison(s): A prior study was performed on 03/22/2017. Prior images  reviewed side by side. Compared to prior study, mitral regurgitation is  less prominent and tricuspid regurgitation appears worse.   Cath: 01/21/21   Ost LAD to Prox LAD lesion is 100% stenosed.  Ramus lesion is 100% stenosed.  2nd Mrg lesion is 80% stenosed.  Prox RCA to Mid RCA lesion is 60% stenosed.  Mid RCA to Dist RCA lesion is 90% stenosed.  Origin to Mid Graft lesion before Ramus is 100% stenosed.  RPDA lesion is 100% stenosed.  2nd RPL lesion is 100% stenosed.   IMPRESSION: Mr. Tangeman has 3 out of 5 grafts patent.  The sequential vein to the ramus branch and obtuse  marginal branch was occluded at its origin.  Sequential vein to the PDA and PLA was widely patent as was the LIMA to the LAD.  The filling pressures were mildly elevated as well as the PA pressure suggesting mild to moderate pulmonary hypertension.  Patient would benefit from additional diuresis.  Mynx closure devices were successfully deployed in the artery and vein.  The patient left lab in stable condition.  Coumadin will be restarted per pharmacy protocol for his persistent A. fib.  [name]. MD, [facility] 01/21/2021 9:55 AM  Diagnostic Dominance: Right   Right Heart Pressures 1: Right atrial pressure-12/16, mean 13 2: Right ventricular pressure- 66/3 3: Pulmonary artery pressure-65/20, mean 34 4: Pulmonary wedge pressure- A-wave 18, V wave 25, mean 20 5: LVEDP-16 6: Cardiac output- 5.4 L/min with an index of 2.7 L/min/m by Fick, 5.6 L/min with an index of 2.8 L/min/m by thermodilution.      Patient Profile     70 y.o. male with a hx of Afib, CAD s/p CABG x 5 in 2003, DM, HLD, and HTN who is being seen 01/20/2021 for the evaluation of chest pain at the request of [name].  Assessment & Plan    1. Dyspnea on exertion: BNP 1237, CXR with no acute abnormalities noted. Echo showed an EF of 50%, rWMA of apical hypokinesis.  RHC pressures elevated this morning.  -- will order IV lasix 40mg  x1 now  2. Possible syncope: there was a period of time he doesn't remember, doesn't remember getting to the ground -- remains on afib, rates in the 60s, does have several pauses on telemetry but all <3 secs -- consider OP heart monitor at discharge   3. Persistent atrial fibrillation/Chronic anticoagulation: rate controlled with coreg -- OAC with coumadin, which was held for cath. INR 1.5 today. Plan to resume post cath -- will have pharmD check cost of Eliquis/Xarelto for possible transition  4. CAD s/p CABG x 5 2003: cath today with occluded seq SVG-PDA and PLA. Recommendation for  medical therapy. -- continue ASA, BB, statin  5. Hypertension: -- coreg 12.5 mg BID, irbesartan 150 mg daily  6. Hyperlipidemia with LDL goal < 70 01/20/2021: Cholesterol 106; HDL 24; LDL Cholesterol 68; Triglycerides 70; VLDL 14 -- on 10 mg crestor  7. AKI: -- sCr 1.26>>>1.19  8. DM: -- A1c 7.8% -- SSI here, plan to resume home regimen at discharge  For questions or updates, please contact [place] Please consult [email or web address] for contact info under        Signed, Reino Bellis, NP  01/21/2021, 11:19 AM     Patient seen and examined. Agree with assessment and plan.  Patient is back from the Cath Lab.  Angiograms were reviewed.  He has an occluded sequential graft supplying the ramus and OM 2 vessel but has a patent LIMA graft to his LAD and a patent sequential vein graft to the PDA and inferior LV branch.  We will plan medical therapy for his CAD.  It appears that the patient has a history of persistent atrial fibrillation, longstanding documented at least since 2018.  This was first identified by his primary physician who subsequently referred him to Dr. Percival Spanish.  At that time Dr. Percival Spanish made a decision not to perform cardioversion and treat with rate control.  Present rhythm shows atrial fibrillation with rate in the 60s.  It is unclear as to what happened to cause his syncopal spell.  We will need to monitor.  May ultimately need EP evaluation.  INR today is 1.5.  Will re-assesss potential to swtich to eliquis or xarelto if cost has improved for patient.Pharcy to check. Otherwise, will re-initiate warfarin tonight and hewill need to have Lovenox subcu injection for possibly 2 days following discharge time for INR to become therapeutic.  Elevated right heart pressures noted at catheterization and BNP elevated yesterday at 1236.  Patient had received furosemide 40 mg x 1 dose.  Troy Sine, MD, [facility] 01/21/2021 11:40 AM

## 2021-01-21 NOTE — Progress Notes (Signed)
[place] for warfarin with heparin bridge Indication: atrial fibrillation  No Known Allergies  Patient Measurements: Height: 5\' 9"  (175.3 cm) Weight: 88.3 kg (194 lb 10.7 oz) IBW/kg (Calculated) : 70.7  Vital Signs: Temp: 98 F (36.7 C) (05/12 0706) Temp Source: Oral (05/12 0706) BP: 121/70 (05/12 0952) Pulse Rate: 60 (05/12 0952)  Labs: Recent Labs    01/19/21 2031 01/20/21 0026 01/20/21 0236 01/20/21 1950 01/20/21 1439 01/20/21 2216 01/21/21 0428 01/21/21 0919 01/21/21 0923  HGB 12.2*  --   --   --   --   --  11.8* 11.9* 11.6*  HCT 38.4*  --   --   --   --   --  37.1* 35.0* 34.0*  PLT 122*  --   --   --   --   --  119*  --   --   APTT 39*  --   --   --   --   --   --   --   --   LABPROT 19.7* 19.6*  --   --   --   --  18.3*  --   --   INR 1.7* 1.7*  --   --   --   --  1.5*  --   --   HEPARINUNFRC  --   --   --   --   --  <0.10* 0.16*  --   --   CREATININE 1.26*  --   --   --   --   --  1.19  --   --   TROPONINIHS  --   --  50* 50* 42*  --   --   --   --     Estimated Creatinine Clearance: 64.4 mL/min (by C-G formula based on SCr of 1.19 mg/dL).   Medical History: Past Medical History:  Diagnosis Date  . A-fib ([place])   . CAD (coronary artery disease)   . Hypertension   . Obstructive sleep apnea    CPAP  . Rosacea   . Type 2 diabetes mellitus with hyperglycemia [facility])      Assessment: 70 yo M on warfarin PTA for hx AFib admitted from OSH with vertigo. Pt previously on rivaroxaban but could not afford. No warfarin given at OSH. Now s/p L/RHC without intervention. Pharmacy asked to restart warfarin with heparin bridge starting 4 hrs after sheath removal, no bolus.  INR 1.5 this morning. CBC ok. Sheath removed at 9326 today. Will restart warfarin and heparin at prior infusion rate 4 hrs after sheath removal.  *Home dose 5mg  daily except 2.5mg  TTS  Goal of Therapy:  INR 2-3 Monitor platelets by anticoagulation protocol:  Yes   Plan:  Warfarin 5 mg PO x1 tonight Restart heparin at 1500 units/hr at 1300 today Check 6hr HL Monitor daily HL, INR, CBC, s/sx bleeding  Richardine Service, PharmD, BCPS PGY2 Cardiology Pharmacy Resident Phone: [phone] 01/21/2021  10:12 AM  Please check [email or web address] for unit-specific pharmacy phone numbers.

## 2021-01-22 ENCOUNTER — Telehealth: Payer: Self-pay | Admitting: Cardiology

## 2021-01-22 ENCOUNTER — Other Ambulatory Visit (HOSPITAL_COMMUNITY): Payer: Self-pay

## 2021-01-22 ENCOUNTER — Encounter: Payer: Self-pay | Admitting: Cardiology

## 2021-01-22 ENCOUNTER — Encounter (HOSPITAL_COMMUNITY): Payer: Self-pay | Admitting: Cardiovascular Disease

## 2021-01-22 ENCOUNTER — Telehealth: Payer: Self-pay

## 2021-01-22 ENCOUNTER — Inpatient Hospital Stay (INDEPENDENT_AMBULATORY_CARE_PROVIDER_SITE_OTHER): Payer: Medicare Other

## 2021-01-22 DIAGNOSIS — I482 Chronic atrial fibrillation, unspecified: Secondary | ICD-10-CM | POA: Diagnosis not present

## 2021-01-22 DIAGNOSIS — R55 Syncope and collapse: Secondary | ICD-10-CM

## 2021-01-22 DIAGNOSIS — R42 Dizziness and giddiness: Secondary | ICD-10-CM

## 2021-01-22 DIAGNOSIS — Z5181 Encounter for therapeutic drug level monitoring: Secondary | ICD-10-CM

## 2021-01-22 LAB — CBC
HCT: 34.1 % — ABNORMAL LOW (ref 39.0–52.0)
Hemoglobin: 11.2 g/dL — ABNORMAL LOW (ref 13.0–17.0)
MCH: 30.1 pg (ref 26.0–34.0)
MCHC: 32.8 g/dL (ref 30.0–36.0)
MCV: 91.7 fL (ref 80.0–100.0)
Platelets: 133 10*3/uL — ABNORMAL LOW (ref 150–400)
RBC: 3.72 MIL/uL — ABNORMAL LOW (ref 4.22–5.81)
RDW: 14.2 % (ref 11.5–15.5)
WBC: 6.1 10*3/uL (ref 4.0–10.5)
nRBC: 0 % (ref 0.0–0.2)

## 2021-01-22 LAB — BASIC METABOLIC PANEL
Anion gap: 7 (ref 5–15)
BUN: 18 mg/dL (ref 8–23)
CO2: 27 mmol/L (ref 22–32)
Calcium: 8.7 mg/dL — ABNORMAL LOW (ref 8.9–10.3)
Chloride: 100 mmol/L (ref 98–111)
Creatinine, Ser: 1.09 mg/dL (ref 0.61–1.24)
GFR, Estimated: >60 mL/min (ref >60)
Glucose, Bld: 194 mg/dL — ABNORMAL HIGH (ref 70–99)
Potassium: 3.4 mmol/L — ABNORMAL LOW (ref 3.5–5.1)
Sodium: 134 mmol/L — ABNORMAL LOW (ref 135–145)

## 2021-01-22 LAB — GLUCOSE, CAPILLARY
Glucose-Capillary: 135 mg/dL — ABNORMAL HIGH (ref 70–99)
Glucose-Capillary: 245 mg/dL — ABNORMAL HIGH (ref 70–99)

## 2021-01-22 MED ORDER — POTASSIUM CHLORIDE CRYS ER 20 MEQ PO TBCR
40.0000 meq | EXTENDED_RELEASE_TABLET | Freq: Once | ORAL | Status: AC
  Administered 2021-01-22: 40 meq via ORAL
  Filled 2021-01-22: qty 2

## 2021-01-22 MED ORDER — CARVEDILOL 6.25 MG PO TABS
6.2500 mg | ORAL_TABLET | Freq: Two times a day (BID) | ORAL | 1 refills | Status: DC
  Filled 2021-01-22: qty 60, 30d supply, fill #0

## 2021-01-22 MED ORDER — CARVEDILOL 6.25 MG PO TABS: 6.2500 mg | ORAL_TABLET | Freq: Two times a day (BID) | ORAL | Status: DC

## 2021-01-22 MED ORDER — MAGNESIUM HYDROXIDE 400 MG/5ML PO SUSP
15.0000 mL | Freq: Every day | ORAL | Status: DC | PRN
  Administered 2021-01-22: 15 mL via ORAL
  Filled 2021-01-22: qty 30

## 2021-01-22 MED ORDER — APIXABAN 5 MG PO TABS
5.0000 mg | ORAL_TABLET | Freq: Two times a day (BID) | ORAL | 1 refills | Status: DC
  Filled 2021-01-22: qty 60, 30d supply, fill #0

## 2021-01-22 MED FILL — Heparin Sod (Porcine)-NaCl IV Soln 1000 Unit/500ML-0.9%: INTRAVENOUS | Qty: 500 | Status: AC

## 2021-01-22 NOTE — Telephone Encounter (Signed)
Lorriane Shire with [employer] is calling to discuss abnormal EKG results.

## 2021-01-22 NOTE — Discharge Summary (Signed)
Physician Discharge Summary  MARKEZ ULRICH [id number] DOB: [birth year] DOA: 01/19/2021  PCP: [name], MD  Admit date: 01/19/2021 Discharge date: 01/22/2021  Admitted From: Home Disposition: Home  Recommendations for Outpatient Follow-up:  1. Follow up with PCP in 1-2 weeks 2. Follow-up with cardiology 3. Please obtain BMP/CBC in one week 4. Please follow up on the following pending results: None  Home Health: No Equipment/Devices: None Discharge Condition: Stable CODE STATUS: Full Diet recommendation: Heart Healthy / Carb Modified   Brief/Interim Summary: 70 year old man with CAD status post CABG 2003, atrial fibrillation, HTN, DM2 and dyslipidemia was transferred from [facility] after admission for syncope associated with diaphoresis and dizziness.  Troponins were noted to be elevated to a max of 78 so he was transferred to [facility] for cardiac work-up.  Patient underwent right and left cardiac catheterization on 01/21/2021, which shows stenosis of 2 out of 5 grafts.  No new intervention done, cardiology is advising maximization of medical management. Having mild pulmonary hypertension. Patient continued to have borderline bradycardia with some pauses-can be contributed to his syncopal episode, cardiology decrease the dose of carvedilol and switched his Coumadin with Eliquis and he will follow-up with them closely.  He was also provided with ZIO monitor before discharge.  Patient also has brain MRI due to syncopal episode which was negative for any acute abnormalities.  There was concern of acute on chronic diastolic heart failure with elevated BNP and pressures on cardiac catheterization.  Patient received some IV diuresis and appears euvolemic.  He will follow-up with cardiology closely for further recommendations.  Patient has an history of COVID infection twice and stating that his overall health is down since then.  Current COVID test was negative.  Patient also had  hypomagnesemia which was resolved with repletion.  He will continue with rest of his home medications and follow-up with his providers.  Discharge Diagnoses:  Principal Problem:   Postural dizziness with presyncope Active Problems:   Elevated troponin   Mixed diabetic hyperlipidemia associated with type 2 diabetes mellitus (HCC)   Type 2 diabetes mellitus with hyperglycemia, without long-term current use of insulin (HCC)   Chronic atrial fibrillation (HCC)   Essential hypertension   Coronary artery disease involving native coronary artery of native heart without angina pectoris   Anticoagulated on warfarin   Pulmonary hypertension, unspecified (HCC)   Alteration in anticoagulation   Syncope and collapse   Discharge Instructions  Discharge Instructions    Diet - low sodium heart healthy   Complete by: As directed    Discharge instructions   Complete by: As directed    It was pleasure taking care of you. Your cardiologist decrease the dose of carvedilol to 6.25 mg because of slower heart rate. They changed to Coumadin with Eliquis. Please follow-up very closely with your cardiologist for further recommendations.   Increase activity slowly   Complete by: As directed      Allergies as of 01/22/2021   No Known Allergies     Medication List    STOP taking these medications   warfarin 5 MG tablet Commonly known as: COUMADIN     TAKE these medications   apixaban 5 MG Tabs tablet Commonly known as: ELIQUIS Take 1 tablet (5 mg total) by mouth 2 (two) times daily.   aspirin EC 81 MG tablet Take 81 mg by mouth daily.   carvedilol 6.25 MG tablet Commonly known as: COREG Take 1 tablet (6.25 mg total) by mouth 2 (two) times daily  with a meal. What changed:   medication strength  how much to take  when to take this   glipiZIDE 10 MG tablet Commonly known as: GLUCOTROL Take 10 mg by mouth daily before breakfast.   Levemir FlexTouch 100 UNIT/ML FlexPen Generic drug:  insulin detemir Inject 10 Units into the skin 2 (two) times daily.   metFORMIN 500 MG 24 hr tablet Commonly known as: GLUCOPHAGE-XR Take 1,000 mg by mouth 2 (two) times daily.   rosuvastatin 10 MG tablet Commonly known as: CRESTOR Take 10 mg by mouth See admin instructions. Takes 10 mg on Mondays and Fridays   Trulicity [id number] Sopn Generic drug: Dulaglutide Inject 0.75 mg into the skin once a week.   valsartan 160 MG tablet Commonly known as: DIOVAN Take 160 mg by mouth daily.   vitamin B-12 1000 MCG tablet Commonly known as: CYANOCOBALAMIN Take 1,000 mcg by mouth daily.       Follow-up Information    Minus Breeding, MD Follow up.   Specialty: Cardiology Why: Hospital follow-up with Cardiology scheduled for 02/05/2021 at 9:00am. Please arrive 15 minutes early for check-in. If this date/time does not work for you, please call our office to reschedule. Contact information: [place] [address] [phone]        [name], MD. Schedule an appointment as soon as possible for a visit.   Specialty: Family Medicine Contact information: [place] [phone]        Troy Sine, MD .   Specialty: Cardiology Contact information: [address] [place] [address] [phone]              No Known Allergies  Consultations:  Cardiology  Procedures/Studies: MR BRAIN WO CONTRAST  Result Date: 01/20/2021 CLINICAL DATA:  Initial evaluation for neuro deficit, stroke suspected. EXAM: MRI HEAD WITHOUT CONTRAST TECHNIQUE: Multiplanar, multiecho pulse sequences of the brain and surrounding structures were obtained without intravenous contrast. COMPARISON:  None available. FINDINGS: Brain: Examination degraded by motion artifact. Cerebral volume within normal limits. Patchy T2/FLAIR hyperintensity seen within the periventricular and deep white matter both cerebral hemispheres, most likely  related chronic microvascular ischemic disease, moderate in nature. No abnormal foci of restricted diffusion to suggest acute or subacute ischemia. Gray-white matter differentiation maintained. No encephalomalacia to suggest chronic cortical infarction. No evidence for acute or chronic intracranial hemorrhage. No mass lesion, midline shift or mass effect. No hydrocephalus or extra-axial fluid collection. Pituitary gland suprasellar region within normal limits. Midline structures intact. Vascular: Major intracranial vascular flow voids are maintained. Skull and upper cervical spine: Craniocervical junction within normal limits. Bone marrow signal intensity normal. No scalp soft tissue abnormality. Sinuses/Orbits: Patient status post bilateral ocular lens replacement. Globes and orbital soft tissues demonstrate no acute finding. Paranasal sinuses are largely clear. Trace left mastoid effusion noted, of doubtful significance. Inner ear structures grossly normal. Other: None. IMPRESSION: 1. No acute intracranial abnormality. 2. Moderate chronic microvascular ischemic disease. Electronically Signed   By: Jeannine Boga M.D.   On: 01/20/2021 03:34   CARDIAC CATHETERIZATION  Result Date: 01/21/2021  Ost LAD to Prox LAD lesion is 100% stenosed.  Ramus lesion is 100% stenosed.  2nd Mrg lesion is 80% stenosed.  Prox RCA to Mid RCA lesion is 60% stenosed.  Mid RCA to Dist RCA lesion is 90% stenosed.  Origin to Mid Graft lesion before Ramus is 100% stenosed.  RPDA lesion is 100% stenosed.  2nd RPL lesion  is 100% stenosed.  MAKAEL STEIN is a 70 y.o. male  [id number] LOCATION:  FACILITY: [facility] PHYSICIAN: Quay Burow, M.D. Jun 22, 1951 DATE OF PROCEDURE:  01/21/2021 DATE OF DISCHARGE: CARDIAC CATHETERIZATION History obtained from chart review.PATRICH HEINZE is a 70 y.o. male with a hx of Afib, CAD s/p CABG x 5 in 2003, DM, HLD, and HTN who is being seen 01/20/2021 for the evaluation of chest pain at the request  of Dr. [name].  His cardiac enzymes were mildly elevated but flat, his EKG showed A. fib but no acute changes.  His EF by 2D echo was 50%.  Since its been 9 years since his CABG and he presented with heart failure it was elected to perform right left heart cath with limited contrast.   Mr. Scogin has 3 out of 5 grafts patent.  The sequential vein to the ramus branch and obtuse marginal branch was occluded at its origin.  Sequential vein to the PDA and PLA was widely patent as was the LIMA to the LAD.  The filling pressures were mildly elevated as well as the PA pressure suggesting mild to moderate pulmonary hypertension.  Patient would benefit from additional diuresis.  Mynx closure devices were successfully deployed in the artery and vein.  The patient left lab in stable condition.  Coumadin will be restarted per pharmacy protocol for his persistent A. fib. Quay Burow. MD, [facility] 01/21/2021 9:55 AM   ECHOCARDIOGRAM COMPLETE  Result Date: 01/20/2021    ECHOCARDIOGRAM REPORT   Patient Name:   NAZARETH KIRK Date of Exam: 01/20/2021 Medical Rec #:  [id number]       Height:       69.0 in Accession #:    [id number]      Weight:       189.4 lb Date of Birth:  Jan 03, 1951       BSA:          2.019 m Patient Age:    29 years        BP:           110/89 mmHg Patient Gender: M               HR:           74 bpm. Exam Location:  Inpatient Procedure: 2D Echo, Cardiac Doppler and Color Doppler Indications:    Chest pain  History:        Patient has prior history of Echocardiogram examinations, most                 recent 03/22/2017. CAD, Prior CABG, Arrythmias:Atrial                 Fibrillation; Risk Factors:Diabetes and Hypertension.  Sonographer:    Cammy Brochure Referring Phys: [id number] [place]  1. Left ventricular ejection fraction, by estimation, is 50%. The left ventricle has mildly decreased function. The left ventricle demonstrates regional wall motion abnormalities consistent with  apical hypokinesis. There is mild concentric left ventricular hypertrophy. Left ventricular diastolic parameters are indeterminate, though tissue Doppler evidence of elevated left atrial pressure.  2. Right ventricular systolic function is mildly to moderately reduced. The right ventricular size is moderately enlarged. There is moderately elevated pulmonary artery systolic pressure. The estimated right ventricular systolic pressure is 41.3 mmHg.  3. Left atrial size was mildly dilated.  4. The mitral valve is grossly normal. Mild mitral valve regurgitation. No evidence of mitral stenosis.  5. Tricuspid valve regurgitation  is mild to moderate and functional.  6. The aortic valve is tricuspid. Aortic valve regurgitation is not visualized. No aortic stenosis is present.  7. The inferior vena cava is normal in size with <50% respiratory variability, suggesting right atrial pressure of 8 mmHg. Comparison(s): A prior study was performed on 03/22/2017. Prior images reviewed side by side. Compared to prior study, mitral regurgitation is less prominent and tricuspid regurgitation appears worse. FINDINGS  Left Ventricle: Left ventricular ejection fraction, by estimation, is 50%. The left ventricle has mildly decreased function. The left ventricle demonstrates regional wall motion abnormalities. The left ventricular internal cavity size was normal in size. There is mild concentric left ventricular hypertrophy. Left ventricular diastolic parameters are indeterminate. Elevated left atrial pressure.  LV Wall Scoring: The entire apex is hypokinetic. Right Ventricle: The right ventricular size is moderately enlarged. No increase in right ventricular wall thickness. Right ventricular systolic function is mildly reduced. There is moderately elevated pulmonary artery systolic pressure. The tricuspid regurgitant velocity is 3.43 m/s, and with an assumed right atrial pressure of 8 mmHg, the estimated right ventricular systolic pressure  is 123456 mmHg. Left Atrium: Left atrial size was mildly dilated. Right Atrium: Right atrial size was normal in size. Pericardium: There is no evidence of pericardial effusion. Mitral Valve: The mitral valve is grossly normal. Mild mitral valve regurgitation. No evidence of mitral valve stenosis. Tricuspid Valve: The tricuspid valve is normal in structure. Tricuspid valve regurgitation is mild to moderate. Aortic Valve: The aortic valve is tricuspid. Aortic valve regurgitation is not visualized. No aortic stenosis is present. Aortic valve mean gradient measures 3.0 mmHg. Aortic valve peak gradient measures 6.0 mmHg. Aortic valve area, by VTI measures 3.09 cm. Pulmonic Valve: The pulmonic valve was not well visualized. Pulmonic valve regurgitation is mild. No evidence of pulmonic stenosis. Aorta: The aortic root and ascending aorta are structurally normal, with no evidence of dilitation. Venous: The inferior vena cava is normal in size with less than 50% respiratory variability, suggesting right atrial pressure of 8 mmHg. IAS/Shunts: The atrial septum is grossly normal.  LEFT VENTRICLE PLAX 2D LVIDd:         5.10 cm LVIDs:         3.80 cm LV PW:         1.10 cm LV IVS:        1.20 cm LVOT diam:     2.10 cm LV SV:         67 LV SV Index:   33 LVOT Area:     3.46 cm  RIGHT VENTRICLE            IVC RV Basal diam:  5.40 cm    IVC diam: 2.00 cm RV S prime:     7.43 cm/s LEFT ATRIUM             Index       RIGHT ATRIUM           Index LA diam:        4.20 cm 2.08 cm/m  RA Area:     21.10 cm LA Vol (A2C):   74.9 ml 37.10 ml/m RA Volume:   59.80 ml  29.62 ml/m LA Vol (A4C):   71.4 ml 35.37 ml/m LA Biplane Vol: 75.1 ml 37.20 ml/m  AORTIC VALVE AV Area (Vmax):    3.04 cm AV Area (Vmean):   2.87 cm AV Area (VTI):     3.09 cm AV Vmax:  122.00 cm/s AV Vmean:          79.900 cm/s AV VTI:            0.216 m AV Peak Grad:      6.0 mmHg AV Mean Grad:      3.0 mmHg LVOT Vmax:         107.00 cm/s LVOT Vmean:         66.200 cm/s LVOT VTI:          0.193 m LVOT/AV VTI ratio: 0.89  AORTA Ao Root diam: 3.60 cm MITRAL VALVE                TRICUSPID VALVE MV Area (PHT): 4.68 cm     TR Peak grad:   47.1 mmHg MV Decel Time: 162 msec     TR Vmax:        343.00 cm/s MV E velocity: 112.00 cm/s                             SHUNTS                             Systemic VTI:  0.19 m                             Systemic Diam: 2.10 cm Rudean Haskell MD Electronically signed by Rudean Haskell MD Signature Date/Time: 01/20/2021/5:08:36 PM    Final      Subjective: Patient was seen and examined today.  No new complaint.  He did not had any bowel movement since Monday.  No nausea or vomiting.  No chest pain.  Wants to go home.  Wife at bedside.  Discharge Exam: Vitals:   01/22/21 0741 01/22/21 1205  BP: 135/68 (!) 156/88  Pulse:    Resp: 14 15  Temp: 98.1 F (36.7 C) 97.6 F (36.4 C)  SpO2:  96%   Vitals:   01/22/21 0300 01/22/21 0630 01/22/21 0741 01/22/21 1205  BP: (!) 144/84 (!) 156/82 135/68 (!) 156/88  Pulse: (!) 53 (!) 59    Resp: 19  14 15   Temp: 97.9 F (36.6 C)  98.1 F (36.7 C) 97.6 F (36.4 C)  TempSrc: Oral  Oral Oral  SpO2: 96%   96%  Weight:      Height:        General: Pt is alert, awake, not in acute distress Cardiovascular: RRR, S1/S2 +, no rubs, no gallops Respiratory: CTA bilaterally, no wheezing, no rhonchi Abdominal: Soft, NT, ND, bowel sounds + Extremities: no edema, no cyanosis   The results of significant diagnostics from this hospitalization (including imaging, microbiology, ancillary and laboratory) are listed below for reference.    Microbiology: Recent Results (from the past 240 hour(s))  MRSA PCR Screening     Status: None   Collection Time: 01/19/21  6:45 PM   Specimen: Nasal Mucosa; Nasopharyngeal  Result Value Ref Range Status   MRSA by PCR NEGATIVE NEGATIVE Final    Comment:        The GeneXpert MRSA Assay (FDA approved for NASAL specimens only), is one  component of a comprehensive MRSA colonization surveillance program. It is not intended to diagnose MRSA infection nor to guide or monitor treatment for MRSA infections. Performed at [facility], [place] [address], [place], [place] [address]  Labs: BNP (last 3 results) Recent Labs    01/19/21 2031  BNP 0000000*   Basic Metabolic Panel: Recent Labs  Lab 01/19/21 2031 01/21/21 0428 01/21/21 0919 01/21/21 0923 01/22/21 0058  NA 132* 134* 139  140 140 134*  K 3.8 3.7 3.5  3.5 3.5 3.4*  CL 99 99  --   --  100  CO2 26 24  --   --  27  GLUCOSE 363* 161*  --   --  194*  BUN 16 19  --   --  18  CREATININE 1.26* 1.19  --   --  1.09  CALCIUM 9.0 9.1  --   --  8.7*  MG 1.6* 2.0  --   --   --    Liver Function Tests: Recent Labs  Lab 01/19/21 2031  AST 17  ALT 19  ALKPHOS 91  BILITOT 1.2  PROT 7.2  ALBUMIN 3.7   No results for input(s): LIPASE, AMYLASE in the last 168 hours. No results for input(s): AMMONIA in the last 168 hours. CBC: Recent Labs  Lab 01/19/21 2031 01/21/21 0428 01/21/21 0919 01/21/21 0923 01/22/21 0058  WBC 7.5 7.2  --   --  6.1  NEUTROABS 5.4  --   --   --   --   HGB 12.2* 11.8* 11.9*  11.9* 11.6* 11.2*  HCT 38.4* 37.1* 35.0*  35.0* 34.0* 34.1*  MCV 94.6 93.2  --   --  91.7  PLT 122* 119*  --   --  133*   Cardiac Enzymes: No results for input(s): CKTOTAL, CKMB, CKMBINDEX, TROPONINI in the last 168 hours. BNP: Invalid input(s): POCBNP CBG: Recent Labs  Lab 01/21/21 1108 01/21/21 1620 01/21/21 2125 01/22/21 0537 01/22/21 1144  GLUCAP 228* 347* 249* 135* 245*   D-Dimer Recent Labs    01/19/21 2031  DDIMER 0.40   Hgb A1c Recent Labs    01/19/21 2031  HGBA1C 7.8*   Lipid Profile Recent Labs    01/20/21 0026  CHOL 106  HDL 24*  LDLCALC 68  TRIG 70  CHOLHDL 4.4   Thyroid function studies No results for input(s): TSH, T4TOTAL, T3FREE, THYROIDAB in the last 72 hours.  Invalid input(s): FREET3 Anemia work  up No results for input(s): VITAMINB12, FOLATE, FERRITIN, TIBC, IRON, RETICCTPCT in the last 72 hours. Urinalysis    Component Value Date/Time   COLORURINE YELLOW 05/28/2007 1324   APPEARANCEUR CLEAR 05/28/2007 1324   LABSPEC 1.004 (L) 05/28/2007 1324   PHURINE 7.0 05/28/2007 1324   GLUCOSEU 250 (A) 05/28/2007 1324   HGBUR NEGATIVE 05/28/2007 1324   BILIRUBINUR NEGATIVE 05/28/2007 1324   KETONESUR NEGATIVE 05/28/2007 1324   PROTEINUR NEGATIVE 05/28/2007 1324   UROBILINOGEN 0.2 05/28/2007 1324   NITRITE NEGATIVE 05/28/2007 1324   LEUKOCYTESUR  05/28/2007 1324    NEGATIVE MICROSCOPIC NOT DONE ON URINES WITH NEGATIVE PROTEIN, BLOOD, LEUKOCYTES, NITRITE, OR GLUCOSE <1000 mg/dL.   Sepsis Labs Invalid input(s): PROCALCITONIN,  WBC,  LACTICIDVEN Microbiology Recent Results (from the past 240 hour(s))  MRSA PCR Screening     Status: None   Collection Time: 01/19/21  6:45 PM   Specimen: Nasal Mucosa; Nasopharyngeal  Result Value Ref Range Status   MRSA by PCR NEGATIVE NEGATIVE Final    Comment:        The GeneXpert MRSA Assay (FDA approved for NASAL specimens only), is one component of a comprehensive MRSA colonization surveillance program. It is not intended to diagnose MRSA infection nor to guide  or monitor treatment for MRSA infections. Performed at [facility], [place] [address], [place], [place] [address]     Time coordinating discharge: Over 30 minutes  SIGNED:  Lorella Nimrod, MD  [facility] 01/22/2021, 1:52 PM  If 7PM-7AM, please contact night-coverage [email or web address]  This record has been created using Systems analyst. Errors have been sought and corrected,but may not always be located. Such creation errors do not reflect on the standard of care.

## 2021-01-22 NOTE — Telephone Encounter (Signed)
Received monitor alert from [employer] for 01/22/2021 at 12:28pm for Afib.  Alert has been addressed by NL RN, March Rummage.  Pt currently on Eliquis 5mg  - Spoke with pt who reports no symptoms and states he had not been discharged from the hospital at that time.  Alert taken to Dr Acie Fredrickson, DOD for review and sign.  Pt advised to continue monitoring and current medications.

## 2021-01-22 NOTE — TOC Benefit Eligibility Note (Signed)
Transition of Care [facility]) Benefit Eligibility Note    Patient Details  Name: [name] MRN: [id number] Date of Birth: 02-15-1951   Medication/Dose: ELIQUIS  2.5 MG BID  CO-PAY- $47.00   and  ELIQUIS  5 MG BID  COVER- YES CO-PAY- $47.00 TIER- 3 DRUG P/A-NO  Covered?: Yes  Tier: 3 Drug  Prescription Coverage Preferred Pharmacy: [facility]   90 DAY  SUPPLY FOR M/O $ 117.50  Spoke with Person/Company/Phone Number:: RAGA   @   [facility] RX # [phone]  Co-Pay: $ 47.00  Prior Approval: No  Deductible:  (NO DEDUCTIBLE WITH PLAN)       Memory Argue Phone Number: 01/22/2021, 11:23 AM

## 2021-01-22 NOTE — Plan of Care (Signed)

## 2021-01-22 NOTE — Progress Notes (Addendum)
Progress Note  Patient Name: [name] Date of Encounter: 01/22/2021  [facility] Cardiologist: Dr. Percival Spanish   Subjective   NO acute overnight events. Patient breathing better after Lasix yesterday. He feels back to baseline. NO chest pain. No recurrent lightheadedness, dizziness, near syncope.  Inpatient Medications    Scheduled Meds: . apixaban  5 mg Oral BID  . aspirin EC  81 mg Oral Daily  . carvedilol  12.5 mg Oral BID WC  . insulin aspart  0-15 Units Subcutaneous TID AC & HS  . irbesartan  150 mg Oral Daily  . rosuvastatin  10 mg Oral Once per day on Mon Fri   Continuous Infusions:  PRN Meds: acetaminophen, meclizine, nitroGLYCERIN, ondansetron (ZOFRAN) IV   Vital Signs    Vitals:   01/21/21 2321 01/22/21 0300 01/22/21 0630 01/22/21 0741  BP: (!) 157/83 (!) 144/84 (!) 156/82 135/68  Pulse: (!) 52 (!) 53 (!) 59   Resp: 18 19  14   Temp: 98.3 F (36.8 C) 97.9 F (36.6 C)  98.1 F (36.7 C)  TempSrc: Oral Oral  Oral  SpO2: 97% 96%    Weight:      Height:        Intake/Output Summary (Last 24 hours) at 01/22/2021 1038 Last data filed at 01/22/2021 0500 Gross per 24 hour  Intake 278.42 ml  Output 1400 ml  Net -1121.58 ml   Last 3 Weights 01/21/2021 01/19/2021 03/01/2017  Weight (lbs) 194 lb 10.7 oz 189 lb 6 oz 165 lb 6.4 oz  Weight (kg) 88.3 kg 85.9 kg 75.025 kg      Telemetry    Atrial fibrillation with rates in the 40 to 60s. PVCs/ventricular couplet noted. Multiple pauses noted (longest being 2.8 seconds). - Personally Reviewed  ECG    No new ECG tracing today. - Personally Reviewed  Physical Exam   GEN: No acute distress.   Neck: No JVD. Cardiac: Irregularly irregular rhythm with borderline bradycardia. No murmurs, rubs, or gallops.  Respiratory: Clear to auscultation bilaterally. GI: Soft, non-distended, and non-tender. MS: No lower extremity edema. No deformity. Skin: Warm and dry. Neuro:  No focal deficits. Psych: Normal affect.  Responds appropriately.  Labs    High Sensitivity Troponin:   Recent Labs  Lab 01/20/21 0236 01/20/21 0821 01/20/21 1439  TROPONINIHS 50* 50* 42*      Chemistry Recent Labs  Lab 01/19/21 2031 01/21/21 0428 01/21/21 0919 01/21/21 0923 01/22/21 0058  NA 132* 134* 139  140 140 134*  K 3.8 3.7 3.5  3.5 3.5 3.4*  CL 99 99  --   --  100  CO2 26 24  --   --  27  GLUCOSE 363* 161*  --   --  194*  BUN 16 19  --   --  18  CREATININE 1.26* 1.19  --   --  1.09  CALCIUM 9.0 9.1  --   --  8.7*  PROT 7.2  --   --   --   --   ALBUMIN 3.7  --   --   --   --   AST 17  --   --   --   --   ALT 19  --   --   --   --   ALKPHOS 91  --   --   --   --   BILITOT 1.2  --   --   --   --   GFRNONAA >60 >60  --   --  >  [place] 11  --   --  7     Hematology Recent Labs  Lab 01/19/21 2031 01/21/21 0428 01/21/21 0919 01/21/21 0923 01/22/21 0058  WBC 7.5 7.2  --   --  6.1  RBC 4.06* 3.98*  --   --  3.72*  HGB 12.2* 11.8* 11.9*  11.9* 11.6* 11.2*  HCT 38.4* 37.1* 35.0*  35.0* 34.0* 34.1*  MCV 94.6 93.2  --   --  91.7  MCH 30.0 29.6  --   --  30.1  MCHC 31.8 31.8  --   --  32.8  RDW 14.2 14.2  --   --  14.2  PLT 122* 119*  --   --  133*    BNP Recent Labs  Lab 01/19/21 2031  BNP 1,236.8*     DDimer  Recent Labs  Lab 01/19/21 2031  DDIMER 0.40     Radiology    CARDIAC CATHETERIZATION  Result Date: 01/21/2021  Ost LAD to Prox LAD lesion is 100% stenosed.  Ramus lesion is 100% stenosed.  2nd Mrg lesion is 80% stenosed.  Prox RCA to Mid RCA lesion is 60% stenosed.  Mid RCA to Dist RCA lesion is 90% stenosed.  Origin to Mid Graft lesion before Ramus is 100% stenosed.  RPDA lesion is 100% stenosed.  2nd RPL lesion is 100% stenosed.  KINSEY RUPANI is a 70 y.o. male  [id number] LOCATION:  FACILITY: [place] PHYSICIAN: Quay Burow, M.D. April 29, 1951 DATE OF PROCEDURE:  01/21/2021 DATE OF DISCHARGE: CARDIAC CATHETERIZATION History obtained from chart review.LEAR MARTINETTI  is a 70 y.o. male with a hx of Afib, CAD s/p CABG x 5 in 2003, DM, HLD, and HTN who is being seen 01/20/2021 for the evaluation of chest pain at the request of Dr. [name].  His cardiac enzymes were mildly elevated but flat, his EKG showed A. fib but no acute changes.  His EF by 2D echo was 50%.  Since its been 9 years since his CABG and he presented with heart failure it was elected to perform right left heart cath with limited contrast.   Mr. Hunsaker has 3 out of 5 grafts patent.  The sequential vein to the ramus branch and obtuse marginal branch was occluded at its origin.  Sequential vein to the PDA and PLA was widely patent as was the LIMA to the LAD.  The filling pressures were mildly elevated as well as the PA pressure suggesting mild to moderate pulmonary hypertension.  Patient would benefit from additional diuresis.  Mynx closure devices were successfully deployed in the artery and vein.  The patient left lab in stable condition.  Coumadin will be restarted per pharmacy protocol for his persistent A. fib. Quay Burow. MD, [facility] 01/21/2021 9:55 AM   ECHOCARDIOGRAM COMPLETE  Result Date: 01/20/2021    ECHOCARDIOGRAM REPORT   Patient Name:   [name] Date of Exam: 01/20/2021 Medical Rec #:  [id number]       Height:       69.0 in Accession #:    [id number]      Weight:       189.4 lb Date of Birth:  08/30/1951       BSA:          2.019 m Patient Age:    70 years        BP:           110/89 mmHg Patient Gender: M  HR:           74 bpm. Exam Location:  Inpatient Procedure: 2D Echo, Cardiac Doppler and Color Doppler Indications:    Chest pain  History:        Patient has prior history of Echocardiogram examinations, most                 recent 03/22/2017. CAD, Prior CABG, Arrythmias:Atrial                 Fibrillation; Risk Factors:Diabetes and Hypertension.  Sonographer:    Cammy Brochure Referring Phys: [id number] [place]  1. Left ventricular ejection fraction, by  estimation, is 50%. The left ventricle has mildly decreased function. The left ventricle demonstrates regional wall motion abnormalities consistent with apical hypokinesis. There is mild concentric left ventricular hypertrophy. Left ventricular diastolic parameters are indeterminate, though tissue Doppler evidence of elevated left atrial pressure.  2. Right ventricular systolic function is mildly to moderately reduced. The right ventricular size is moderately enlarged. There is moderately elevated pulmonary artery systolic pressure. The estimated right ventricular systolic pressure is 45.4 mmHg.  3. Left atrial size was mildly dilated.  4. The mitral valve is grossly normal. Mild mitral valve regurgitation. No evidence of mitral stenosis.  5. Tricuspid valve regurgitation is mild to moderate and functional.  6. The aortic valve is tricuspid. Aortic valve regurgitation is not visualized. No aortic stenosis is present.  7. The inferior vena cava is normal in size with <50% respiratory variability, suggesting right atrial pressure of 8 mmHg. Comparison(s): A prior study was performed on 03/22/2017. Prior images reviewed side by side. Compared to prior study, mitral regurgitation is less prominent and tricuspid regurgitation appears worse. FINDINGS  Left Ventricle: Left ventricular ejection fraction, by estimation, is 50%. The left ventricle has mildly decreased function. The left ventricle demonstrates regional wall motion abnormalities. The left ventricular internal cavity size was normal in size. There is mild concentric left ventricular hypertrophy. Left ventricular diastolic parameters are indeterminate. Elevated left atrial pressure.  LV Wall Scoring: The entire apex is hypokinetic. Right Ventricle: The right ventricular size is moderately enlarged. No increase in right ventricular wall thickness. Right ventricular systolic function is mildly reduced. There is moderately elevated pulmonary artery systolic pressure.  The tricuspid regurgitant velocity is 3.43 m/s, and with an assumed right atrial pressure of 8 mmHg, the estimated right ventricular systolic pressure is 09.8 mmHg. Left Atrium: Left atrial size was mildly dilated. Right Atrium: Right atrial size was normal in size. Pericardium: There is no evidence of pericardial effusion. Mitral Valve: The mitral valve is grossly normal. Mild mitral valve regurgitation. No evidence of mitral valve stenosis. Tricuspid Valve: The tricuspid valve is normal in structure. Tricuspid valve regurgitation is mild to moderate. Aortic Valve: The aortic valve is tricuspid. Aortic valve regurgitation is not visualized. No aortic stenosis is present. Aortic valve mean gradient measures 3.0 mmHg. Aortic valve peak gradient measures 6.0 mmHg. Aortic valve area, by VTI measures 3.09 cm. Pulmonic Valve: The pulmonic valve was not well visualized. Pulmonic valve regurgitation is mild. No evidence of pulmonic stenosis. Aorta: The aortic root and ascending aorta are structurally normal, with no evidence of dilitation. Venous: The inferior vena cava is normal in size with less than 50% respiratory variability, suggesting right atrial pressure of 8 mmHg. IAS/Shunts: The atrial septum is grossly normal.  LEFT VENTRICLE PLAX 2D LVIDd:         5.10 cm LVIDs:  3.80 cm LV PW:         1.10 cm LV IVS:        1.20 cm LVOT diam:     2.10 cm LV SV:         67 LV SV Index:   33 LVOT Area:     3.46 cm  RIGHT VENTRICLE            IVC RV Basal diam:  5.40 cm    IVC diam: 2.00 cm RV S prime:     7.43 cm/s LEFT ATRIUM             Index       RIGHT ATRIUM           Index LA diam:        4.20 cm 2.08 cm/m  RA Area:     21.10 cm LA Vol (A2C):   74.9 ml 37.10 ml/m RA Volume:   59.80 ml  29.62 ml/m LA Vol (A4C):   71.4 ml 35.37 ml/m LA Biplane Vol: 75.1 ml 37.20 ml/m  AORTIC VALVE AV Area (Vmax):    3.04 cm AV Area (Vmean):   2.87 cm AV Area (VTI):     3.09 cm AV Vmax:           122.00 cm/s AV Vmean:           79.900 cm/s AV VTI:            0.216 m AV Peak Grad:      6.0 mmHg AV Mean Grad:      3.0 mmHg LVOT Vmax:         107.00 cm/s LVOT Vmean:        66.200 cm/s LVOT VTI:          0.193 m LVOT/AV VTI ratio: 0.89  AORTA Ao Root diam: 3.60 cm MITRAL VALVE                TRICUSPID VALVE MV Area (PHT): 4.68 cm     TR Peak grad:   47.1 mmHg MV Decel Time: 162 msec     TR Vmax:        343.00 cm/s MV E velocity: 112.00 cm/s                             SHUNTS                             Systemic VTI:  0.19 m                             Systemic Diam: 2.10 cm Rudean Haskell MD Electronically signed by Rudean Haskell MD Signature Date/Time: 01/20/2021/5:08:36 PM    Final     Cardiac Studies   Echocardiogram 01/20/2021: Impressions: 1. Left ventricular ejection fraction, by estimation, is 50%. The left  ventricle has mildly decreased function. The left ventricle demonstrates  regional wall motion abnormalities consistent with apical hypokinesis.  There is mild concentric left  ventricular hypertrophy. Left ventricular diastolic parameters are  indeterminate, though tissue Doppler evidence of elevated left atrial  pressure.  2. Right ventricular systolic function is mildly to moderately reduced.  The right ventricular size is moderately enlarged. There is moderately  elevated pulmonary artery systolic pressure. The estimated right  ventricular systolic pressure is 123456 mmHg.  3.  Left atrial size was mildly dilated.  4. The mitral valve is grossly normal. Mild mitral valve regurgitation.  No evidence of mitral stenosis.  5. Tricuspid valve regurgitation is mild to moderate and functional.  6. The aortic valve is tricuspid. Aortic valve regurgitation is not  visualized. No aortic stenosis is present.  7. The inferior vena cava is normal in size with <50% respiratory  variability, suggesting right atrial pressure of 8 mmHg.   Comparison(s): A prior study was performed on 03/22/2017. Prior  images  reviewed side by side. Compared to prior study, mitral regurgitation is  less prominent and tricuspid regurgitation appears worse.  _______________  Right/Left Cardiac Catheterization 01/21/2021:  Ost LAD to Prox LAD lesion is 100% stenosed.  Ramus lesion is 100% stenosed.  2nd Mrg lesion is 80% stenosed.  Prox RCA to Mid RCA lesion is 60% stenosed.  Mid RCA to Dist RCA lesion is 90% stenosed.  Origin to Mid Graft lesion before Ramus is 100% stenosed.  RPDA lesion is 100% stenosed.  2nd RPL lesion is 100% stenosed.  Impressions: Mr. Messman has 3 out of 5 grafts patent.  The sequential vein to the ramus branch and obtuse marginal branch was occluded at its origin.  Sequential vein to the PDA and PLA was widely patent as was the LIMA to the LAD.  The filling pressures were mildly elevated as well as the PA pressure suggesting mild to moderate pulmonary hypertension.  Patient would benefit from additional diuresis.  Mynx closure devices were successfully deployed in the artery and vein.  The patient left lab in stable condition.  Coumadin will be restarted per pharmacy protocol for his persistent A. fib.  Diagnostic Dominance: Right      Patient Profile     70 y.o. male with a history of CAD s/p CABG x5 in 2003, persistent atrial fibrillation on Coumadin, hypertension, hyperlipidemia, and diabetes mellitus who presented to [facility] on 01/18/2021 for evaluation of dizziness/vertigo. His troponin was minimally elevated and he also reported increasing dyspnea on exertion for the past several months. Therefore, he was transferred to [facility] for further work-up.  Assessment & Plan    Dyspnea of Exertion - BNP elevated at 1237.  - Chest x-ray showed no acute findings.  - Echo showed LVEF of 50% with apical hypokinesis. - R/LHC yesterday showed 2/5 graft patent with SVG to RI and OM branch being occluded at origin. No intervention preformed. Filling pressures mildly  elevated as well as the PA pressure suggesting mild to moderate pulmonary hypertension.  - Patient received dose of IV Lasix 40mg  after cath with good urinary response. Documented 1.4 L of output yesterday. No updated weight today. Renal function stable.  - Appears euvolemic on exam today.  - Will discuss with MD about whether he recommends any PO Lasix.  CAD s/p CABG x5 in 2003 - Cath on 01/22/2020 showed 2/5 graft patent with SVG to RI and OM branch being occluded at origin. Continued medical therapy recommended.  - No angina. - Continue aspirin, beta-blocker, and statin.  Persistent Atrial Fibrillation - Rates slow in the 40s at times with some pauses all less than 3 seconds.  - Will decrease Coreg to 6.25mg  twice daily. - On Couamdin as outpatient but we have switched to Eliquis. Continue 5mg  twice daily.  Possible Syncope - There was a period of time prior to admission that patient does not remember. He states he got out of his truck and was walking to his house  when he started not feeling well (lightheaded/dizzy). He states he felt like he was going to fall so he thinks he started making his wake to the ground but he is not exactly sure. He may have passed out. - No recurrent near syncope. - Likely need an outpatient monitor. Will discuss with MD.  Hypertension - BP relatively well controlled. - Continue Irbesartan 150mg  daily. - Will decrease Coreg to 6.25mg  twice daily.  Hyperlipidemia - Lipid panel this admission: Total Cholesterol 106, Triglycerides 70, HDL, 24, LDL 68.  - LDL goal <70 given CAD. - Continue Crestor 10mg  daily.  Diabetes Mellitus - Hemoglobin A1c 7.8. - Management per primary team.  For questions or updates, please contact [place] Please consult www.[email or web address].com for contact info under        Signed, Darreld Mclean, PA-C  01/22/2021, 10:38 AM     Patient seen and examined. Agree with assessment and plan. Pt feels well, no chest pain or  dyspnea. AFib rate stable; will decrease coreg to 6.25 with bradycardia. Good diuresis yesterday with Lasix 40 mg x1;  -1121 yesterday. No JVD, rales or edema.  Have transitioned from warfarin to eliquis; plan dc today with 2 week outpatient cardiac monitor. Will send home with PRN furosemide 20 mg.  Plan dc today, f/u with Dr. Percival Spanish.   Troy Sine, MD, [facility] 01/22/2021 11:51 AM

## 2021-01-22 NOTE — Telephone Encounter (Addendum)
This RN received a call from Lesly Dukes, EKG tech with [employer], stating that patient had gone into atrial fibrillation for the first documented time. Per Lorriane Shire, the patient's rate was between 56 and 82 beats per minute for 90 seconds with an average bpm of 74. This was taken at 12:29pm [place] time. Lorriane Shire reported she attempted to call patient but did not get a response and would try again in a few minutes.  This RN also attempted to call patient, patient did not answer, this RN left a non specific voicemail for patient to return office call. Patient is currently on Eliquis 5mg . Per protocol, this RN brought DOD Dr. Percival Spanish the [employer] report. Dr. Percival Spanish reviewed monitor strips, no changes at this time. Will forward to MD for review.

## 2021-01-22 NOTE — TOC Progression Note (Signed)
Transition of Care [facility]) - Progression Note    Patient Details  Name: [name] MRN: [id number] Date of Birth: 09/30/1950  [facility] of [facility] CM/SW Contact  Zenon Mayo, RN Phone Number: 01/22/2021, 1:25 PM  Clinical Narrative:    NCN notified by Staff RN patient for dc today, NCM notified MD to send eliquis to [facility] pharmacy to be filled for the first 30 day free.         Expected Discharge Plan and Services                                                 Social Determinants of Health (SDOH) Interventions    Readmission Risk Interventions No flowsheet data found.

## 2021-01-22 NOTE — Progress Notes (Signed)
Zio patch placed onto patient.  All instructions and information reviewed with patient, they verbalize understanding with no questions. 

## 2021-01-23 DIAGNOSIS — R55 Syncope and collapse: Secondary | ICD-10-CM | POA: Diagnosis not present

## 2021-01-23 DIAGNOSIS — R42 Dizziness and giddiness: Secondary | ICD-10-CM | POA: Diagnosis not present

## 2021-01-23 DIAGNOSIS — I482 Chronic atrial fibrillation, unspecified: Secondary | ICD-10-CM | POA: Diagnosis not present

## 2021-01-25 ENCOUNTER — Other Ambulatory Visit (HOSPITAL_BASED_OUTPATIENT_CLINIC_OR_DEPARTMENT_OTHER): Payer: Self-pay

## 2021-01-25 ENCOUNTER — Other Ambulatory Visit (HOSPITAL_COMMUNITY): Payer: Self-pay

## 2021-01-25 ENCOUNTER — Telehealth (HOSPITAL_BASED_OUTPATIENT_CLINIC_OR_DEPARTMENT_OTHER): Payer: Self-pay

## 2021-01-25 NOTE — Telephone Encounter (Signed)
Patient has known persistent atrial fibrillation. Therefore, no new recommendations at this time. Monitor was ordered to look for concerning arrhythmias as cause of possible syncope. Will wait for full monitor results.  Darreld Mclean, PA-C 01/25/2021 5:53 PM

## 2021-01-25 NOTE — Telephone Encounter (Signed)
Pharmacy Transitions of Care Follow-up Telephone Call  Date of discharge:01/22/21 Discharge Diagnosis: Postural dizziness  How have you been since you were released from the hospital? Patient reports feeling great after his discharge and reports no issues with his medications.   Medication changes made at discharge:  - START: Eliquis   - STOPPED:  warfarin  - CHANGED: carvedilol   Medication changes verified by the patient? Yes  Medication Accessibility:  Home Pharmacy[facility] in [place]  Was the patient provided with refills on discharged medications? Yes  Have all prescriptions been transferred from [facility] to home pharmacy? Yes   Is the patient able to afford medications? Yes . Notable copays: $47/month     Medication Review:   APIXABAN (ELIQUIS)  - Discussed importance of taking medication around the same time everyday  - Reviewed potential DDIs with patient  - Advised patient of medications to avoid (NSAIDs, ASA)  - Educated that Tylenol (acetaminophen) will be the preferred analgesic to prevent risk of bleeding  - Emphasized importance of monitoring for signs and symptoms of bleeding (abnormal bruising, prolonged bleeding, nose bleeds, bleeding from gums, discolored urine, black tarry stools)  - Advised patient to alert all providers of anticoagulation therapy prior to starting a new medication or having a procedure   Follow-up Appointments:  PCP Hospital f/u appt confirmed? Yes Scheduled to see Krystal Clark on 02/05/21 @ 9am.   If their condition worsens, is the pt aware to call PCP or go to the Emergency Dept.? Yes  Final Patient Assessment: Patient feels comfortable after discussing medications and has not reported any signs of bleeding. He did report some dark, tarry stools when he left the hospital but it has since resolved.

## 2021-01-26 ENCOUNTER — Telehealth: Payer: Self-pay | Admitting: Cardiology

## 2021-01-26 NOTE — Telephone Encounter (Signed)
[employer] calling to report abnormal EKG. Please advise.

## 2021-01-26 NOTE — Telephone Encounter (Signed)
Spoke with [name] regarding report Multiple episodes of slow Afib with average rate in upper 30's  Monitor should be uploaded  Will forward to Dr Shanon Rosser for review

## 2021-01-27 ENCOUNTER — Telehealth: Payer: Self-pay | Admitting: Student

## 2021-01-27 DIAGNOSIS — E113313 Type 2 diabetes mellitus with moderate nonproliferative diabetic retinopathy with macular edema, bilateral: Secondary | ICD-10-CM | POA: Diagnosis not present

## 2021-01-27 NOTE — Telephone Encounter (Signed)
Strips reviewed by Dr Percival Spanish, continue to monitor and see next week as planned

## 2021-01-27 NOTE — Telephone Encounter (Signed)
Received call from Merrilee Seashore at [facility] in to triage, who states that patient had 3 separate runs of low heart rate. First was 40BPM for 60 seconds at 5: 39AM this morning, Second was 40 BPM for 60 seconds at 5:46AM this morning, and Third was 40 BPM for 60 seconds at 6:03AM this morning.   Called patient, patient states that he is not having any symptoms at this time,and states that he was asleep this morning during those episodes. Advised patient to call back to office with any issues, questions, or concerns. Patient verbalized understanding.   [employer] strips printed and shown to DOD (Dr. Harrell Gave)- Patient is on Eliquis, patient to continue to wear monitor.   Will forward to Dr. Percival Spanish to make aware, will place strips in Dr. Cherlyn Cushing basket.

## 2021-01-27 NOTE — Telephone Encounter (Signed)
[name] is calling to report an abnormal EKG

## 2021-01-27 NOTE — Telephone Encounter (Signed)
I don't see the final uploaded monitor.  The patient can keep his follow up with me as scheduled for later this month.

## 2021-01-27 NOTE — Telephone Encounter (Signed)
Strips reviewed by Dr Hochrein, continue to monitor and see next week as planned  

## 2021-01-28 ENCOUNTER — Telehealth: Payer: Self-pay

## 2021-01-28 NOTE — Telephone Encounter (Signed)
Received a call from [facility] calling to report 3 episodes of slow heart rate this morning 5:30 am 38, 5:52 am 40, 6:50 am 35 all afib.Advised Dr.Hochrein out of office.I will speak to DOD Dr.Christopher.

## 2021-01-28 NOTE — Telephone Encounter (Signed)
DOD Dr.Christopher reviewed monitor strips from this morning revealing 3 episodes of afib with slow heart rate,38,40,35.She advised continue same medications,no change.Spoke to patient stated he feels fine.He was unaware of slow heart beat.Advised to continue same medications.Keep appointment as planned with Dr.Hochrein 02/05/21 at 9:00 am.

## 2021-01-29 ENCOUNTER — Telehealth: Payer: Self-pay | Admitting: Student

## 2021-01-29 NOTE — Telephone Encounter (Signed)
Received a call from [facility] calling to report 2 episodes of slow heart rate this morning 6:07am and 6:22 am.Both episodes afib rate 40.Spoke to patient he was not aware of slow heart rate.

## 2021-01-30 ENCOUNTER — Telehealth: Payer: Self-pay | Admitting: Cardiology

## 2021-01-30 NOTE — Telephone Encounter (Signed)
    Received outpatient page from [facility] regarding patient having slow A. fib at a rate of 38 bpm for 60 seconds.  Patient remains asymptomatic.  Appears he has been called many times for this.  He has follow-up on 02/05/2021 with Dr. Percival Spanish.  No changes made at this time.  Kathyrn Drown NP-C [place] Pager: [phone]

## 2021-02-01 NOTE — Telephone Encounter (Signed)
Spoke with [name] to report an abnormal [employer], the patient had slow atrial fib around 5:50 am. Heart rate was 40 bpm. Range of heart rate was 34-73 bpm. Patient has an appointment 02/05/21.

## 2021-02-02 ENCOUNTER — Telehealth: Payer: Self-pay | Admitting: Cardiology

## 2021-02-02 DIAGNOSIS — L609 Nail disorder, unspecified: Secondary | ICD-10-CM | POA: Diagnosis not present

## 2021-02-02 DIAGNOSIS — E1140 Type 2 diabetes mellitus with diabetic neuropathy, unspecified: Secondary | ICD-10-CM | POA: Diagnosis not present

## 2021-02-02 NOTE — Telephone Encounter (Signed)
Report reviewed by Dr Percival Spanish no new Order. Patient has an appointment 02/05/21

## 2021-02-02 NOTE — Telephone Encounter (Signed)
[name] from [facility] calling with an abnormal ekg.

## 2021-02-02 NOTE — Telephone Encounter (Signed)
New message:      Lambert Keto calling from [facility] with a STAT lab  abnormal EKG 2 slow AFIB and time today.

## 2021-02-02 NOTE — Telephone Encounter (Signed)
Spoke to  representative ,[employer] She states monitor showed slow atrial fib twice earlier today at 12:35 am  and 12:38 am  Both lasting 60 seconds  Rate of 39 .   Awaiting for monitor Strips to be sent to  Review with provider.

## 2021-02-02 NOTE — Telephone Encounter (Signed)
See additional telephone encounter dated 5/24 for information received from [facility] regarding this patient.

## 2021-02-03 ENCOUNTER — Telehealth: Payer: Self-pay | Admitting: Cardiology

## 2021-02-03 NOTE — Telephone Encounter (Signed)
Patient has appt with me in two days.

## 2021-02-03 NOTE — Telephone Encounter (Signed)
Spoke with Claiborne Billings ([employer])  Slow-Afib - 1 episode 3:25am - rate 38bpm - 60 sec duration  Advised Kelly to fax report  Patient has appointment with MD on 02/05/21  Patient is on carvedilol 6.25mg  BID and eliquis 5mg  BID

## 2021-02-03 NOTE — Telephone Encounter (Signed)
New message:      [name] from [facility] calling to report a abnormal EKG

## 2021-02-03 NOTE — Telephone Encounter (Signed)
Monitor report received. Put in MD mailbox to be reviewed.  Report notes HR range from 33-56bpm (average of 43bpm)

## 2021-02-04 ENCOUNTER — Telehealth: Payer: Self-pay | Admitting: Cardiology

## 2021-02-04 NOTE — Telephone Encounter (Signed)
Received a call from [facility] with [employer] calling to report patient had one episode of slow Afib at 3:00 am this morning,rate 39.Patient has appointment with Dr.Hochrein tomorrow 5/27 at 9:00 am.

## 2021-02-04 NOTE — Progress Notes (Signed)
Cardiology Office Note   Date:  02/05/2021   ID:  Janziel, Hockett 03-Feb-1951, MRN [id number]  PCP:  Bonnita Hollow, MD  Cardiologist:   Shelva Majestic, MD   Chief Complaint  Patient presents with  . Loss of Consciousness      History of Present Illness: [name] is a 70 y.o. male who for follow up of CAD.      He was hospitalized for syncope.  He had a cath as below.  3 out of 5 grafts patent.  The sequential vein to the ramus branch and obtuse marginal branch was occluded at its origin.  Sequential vein to the PDA and PLA was widely patent as was the LIMA to the LAD.  He was managed medically.  Cath demonstrated an EF of 50%.  There was apical hypokinesis and mild LVH.  He had moderately elevated pulmonary pressures.  There was mild MR.   He wore a monitor and the atrial fib has a slow heart rate in the 30s.     Since he was discharged she has had no new complaints.   The patient denies any new symptoms such as chest discomfort, neck or arm discomfort. There has been no new shortness of breath, PND or orthopnea. There have been no reported palpitations, presyncope or syncope.    Past Medical History:  Diagnosis Date  . A-fib ([place])   . CAD (coronary artery disease)   . Hypertension   . Obstructive sleep apnea    CPAP  . Rosacea   . Type 2 diabetes mellitus with hyperglycemia [facility])     Past Surgical History:  Procedure Laterality Date  . CORONARY ARTERY BYPASS GRAFT     LIMA to the LAD, sequential SVG to ramus intermediate and OM1, sequential SVG to PDA and posterior lateral  . RIGHT/LEFT HEART CATH AND CORONARY/GRAFT ANGIOGRAPHY N/A 01/21/2021   Procedure: RIGHT/LEFT HEART CATH AND CORONARY/GRAFT ANGIOGRAPHY;  Surgeon: Lorretta Harp, MD;  Location: [place] [facility];  Service: Cardiovascular;  Laterality: N/A;     Current Outpatient Medications  Medication Sig Dispense Refill  . apixaban (ELIQUIS) 5 MG TABS tablet Take 1 tablet (5 mg total) by mouth  2 (two) times daily. 60 tablet 1  . aspirin EC 81 MG tablet Take 81 mg by mouth daily.    . Dulaglutide (TRULICITY) 2.58 NI/7.7OE SOPN Inject 0.75 mg into the skin once a week.    Marland Kitchen glipiZIDE (GLUCOTROL) 10 MG tablet Take 10 mg by mouth daily before breakfast.    . LEVEMIR FLEXTOUCH 100 UNIT/ML FlexPen Inject 10 Units into the skin 2 (two) times daily.    . metFORMIN (GLUCOPHAGE-XR) 500 MG 24 hr tablet Take 1,000 mg by mouth 2 (two) times daily.    . rosuvastatin (CRESTOR) 10 MG tablet Take 10 mg by mouth See admin instructions. Takes 10 mg on Mondays and Fridays    . valsartan (DIOVAN) 160 MG tablet Take 160 mg by mouth daily.    . vitamin B-12 (CYANOCOBALAMIN) 1000 MCG tablet Take 1,000 mcg by mouth daily.     No current facility-administered medications for this visit.    Allergies:   Patient has no known allergies.    Social History:  The patient  reports that he has never smoked. His smokeless tobacco use includes snuff.   Family History:  The patient's family history includes Breast cancer in his mother; CAD (age of onset: 5) in his brother; Diabetes in his  brother and mother; Heart attack in his mother; Heart attack (age of onset: 78) in his father; Heart disease in his father; Heart disease (age of onset: 38) in his mother; Kidney cancer in his father.    ROS:  Please see the history of present illness.   Otherwise, review of systems are positive for none.   All other systems are reviewed and negative.    PHYSICAL EXAM: VS:  BP 130/70 (BP Location: Left Arm, Patient Position: Sitting, Cuff Size: Normal)   Pulse 60   Resp 18   Ht 5\' 9"  (1.753 m)   Wt 184 lb 12.8 oz (83.8 kg)   SpO2 99%   BMI 27.29 kg/m  , BMI Body mass index is 27.29 kg/m. GENERAL:  Well appearing NECK:  No jugular venous distention, waveform within normal limits, carotid upstroke brisk and symmetric, no bruits, no thyromegaly LUNGS:  Clear to auscultation bilaterally CHEST:  Well healed sternotomy  scar. HEART:  PMI not displaced or sustained,S1 and S2 within normal limits, no S3, no clicks, no rubs, no murmurs, irregular  ABD:  Flat, positive bowel sounds normal in frequency in pitch, no bruits, no rebound, no guarding, no midline pulsatile mass, no hepatomegaly, no splenomegaly EXT:  2 plus pulses throughout, no edema, no cyanosis no clubbing    EKG:  EKG is ordered today. The ekg ordered today demonstrates atrial fibrillation, rate 60, lateral T wave inversions unchanged from previous  CARDIAC CATH:  Diagnostic Dominance: Right      Recent Labs: 01/19/2021: ALT 19; B Natriuretic Peptide 1,236.8 01/21/2021: Magnesium 2.0 01/22/2021: BUN 18; Creatinine, Ser 1.09; Hemoglobin 11.2; Platelets 133; Potassium 3.4; Sodium 134    Lipid Panel    Component Value Date/Time   CHOL 106 01/20/2021 0026   TRIG 70 01/20/2021 0026   HDL 24 (L) 01/20/2021 0026   CHOLHDL 4.4 01/20/2021 0026   VLDL 14 01/20/2021 0026   LDLCALC 68 01/20/2021 0026      Wt Readings from Last 3 Encounters:  02/05/21 184 lb 12.8 oz (83.8 kg)  01/21/21 194 lb 10.7 oz (88.3 kg)  03/01/17 165 lb 6.4 oz (75 kg)      Other studies Reviewed: Additional studies/ records that were reviewed today include: Hospital records, event monitor.  . Review of the above records demonstrates:  Please see elsewhere in the note.     ASSESSMENT AND PLAN:  CAD:   The patient has no new sypmtoms.  No further cardiovascular testing is indicated.  We will continue with aggressive risk reduction and meds as listed.  CHRONIC ATRIAL FIB:   The rate was slow.   I am going to stop the Coreg.   He is to let me know if he has any further symptoms.    HTN:  The blood pressure is at target. No change in medications is indicated. We will continue with therapeutic lifestyle changes (TLC).  DYSLIPIDEMIA:   LDL is at target.  No change in therapy.     DM: A1c was 7.8.   He has had his meds changed recently with the addition of  Levemir.  This is being treated by Bonnita Hollow, MD  PULMONARY HTN:  The PA pressure was 34 on right heart cath.    Current medicines are reviewed at length with the patient today.  The patient does not have concerns regarding medicines.  The following changes have been made: As above  Labs/ tests ordered today include: None No orders of the defined  types were placed in this encounter.    Disposition:   FU with me in six months     Signed, Minus Breeding, MD  02/05/2021 10:24 AM    [place]

## 2021-02-04 NOTE — Telephone Encounter (Signed)
Called with an irregular EKG for the patient. Please advise

## 2021-02-05 ENCOUNTER — Telehealth: Payer: Self-pay | Admitting: Cardiology

## 2021-02-05 ENCOUNTER — Encounter: Payer: Self-pay | Admitting: Cardiology

## 2021-02-05 ENCOUNTER — Other Ambulatory Visit: Payer: Self-pay

## 2021-02-05 ENCOUNTER — Ambulatory Visit (INDEPENDENT_AMBULATORY_CARE_PROVIDER_SITE_OTHER): Payer: Medicare Other | Admitting: Cardiology

## 2021-02-05 VITALS — BP 130/70 | HR 60 | Resp 18 | Ht 69.0 in | Wt 184.8 lb

## 2021-02-05 DIAGNOSIS — R55 Syncope and collapse: Secondary | ICD-10-CM | POA: Diagnosis not present

## 2021-02-05 NOTE — Telephone Encounter (Signed)
Spoke to  [employer] from [employer] - patient monitor shows   Slow afib - rate of 34  About 3:45 am 02/05/21.  monitor strip will be faxed to office for review  Patient has an appointment today at 9 am .

## 2021-02-05 NOTE — Telephone Encounter (Signed)
[facility] called for abnormal EKG for the patient. Sent to triage

## 2021-02-05 NOTE — Patient Instructions (Signed)
Medication Instructions:  Stop taking Carvedilol 6.25 mg twice a day  *If you need a refill on your cardiac medications before your next appointment, please call your pharmacy*   Lab Work: None ordered today   Testing/Procedures: None ordered today   Follow-Up: At [facility], you and your health needs are our priority.  As part of our continuing mission to provide you with exceptional heart care, we have created designated Provider Care Teams.  These Care Teams include your primary Cardiologist (physician) and Advanced Practice Providers (APPs -  Physician Assistants and Nurse Practitioners) who all work together to provide you with the care you need, when you need it.  We recommend signing up for the patient portal called "MyChart".  Sign up information is provided on this After Visit Summary.  MyChart is used to connect with patients for Virtual Visits (Telemedicine).  Patients are able to view lab/test results, encounter notes, upcoming appointments, etc.  Non-urgent messages can be sent to your provider as well.   To learn more about what you can do with MyChart, go to [email or web address].    Your next appointment:   6 month(s)  The format for your next appointment:   In Person  Provider:   Minus Breeding, MD

## 2021-02-08 DIAGNOSIS — E1165 Type 2 diabetes mellitus with hyperglycemia: Secondary | ICD-10-CM | POA: Diagnosis not present

## 2021-02-08 DIAGNOSIS — I10 Essential (primary) hypertension: Secondary | ICD-10-CM | POA: Diagnosis not present

## 2021-02-08 DIAGNOSIS — M170 Bilateral primary osteoarthritis of knee: Secondary | ICD-10-CM | POA: Diagnosis not present

## 2021-02-08 DIAGNOSIS — Z7984 Long term (current) use of oral hypoglycemic drugs: Secondary | ICD-10-CM | POA: Diagnosis not present

## 2021-02-21 DIAGNOSIS — I208 Other forms of angina pectoris: Secondary | ICD-10-CM | POA: Diagnosis not present

## 2021-02-21 DIAGNOSIS — Z09 Encounter for follow-up examination after completed treatment for conditions other than malignant neoplasm: Secondary | ICD-10-CM | POA: Diagnosis not present

## 2021-02-22 ENCOUNTER — Other Ambulatory Visit (HOSPITAL_COMMUNITY): Payer: Self-pay

## 2021-02-25 NOTE — Addendum Note (Signed)
Addended by: Jacqulynn Cadet on: 02/25/2021 03:02 PM   Modules accepted: Orders

## 2021-03-11 DIAGNOSIS — Z7984 Long term (current) use of oral hypoglycemic drugs: Secondary | ICD-10-CM | POA: Diagnosis not present

## 2021-03-11 DIAGNOSIS — E7849 Other hyperlipidemia: Secondary | ICD-10-CM | POA: Diagnosis not present

## 2021-03-11 DIAGNOSIS — I10 Essential (primary) hypertension: Secondary | ICD-10-CM | POA: Diagnosis not present

## 2021-03-11 DIAGNOSIS — M1009 Idiopathic gout, multiple sites: Secondary | ICD-10-CM | POA: Diagnosis not present

## 2021-03-11 DIAGNOSIS — M170 Bilateral primary osteoarthritis of knee: Secondary | ICD-10-CM | POA: Diagnosis not present

## 2021-03-11 DIAGNOSIS — F331 Major depressive disorder, recurrent, moderate: Secondary | ICD-10-CM | POA: Diagnosis not present

## 2021-03-11 DIAGNOSIS — E1165 Type 2 diabetes mellitus with hyperglycemia: Secondary | ICD-10-CM | POA: Diagnosis not present

## 2021-03-11 DIAGNOSIS — H3582 Retinal ischemia: Secondary | ICD-10-CM | POA: Diagnosis not present

## 2021-03-11 DIAGNOSIS — R7301 Impaired fasting glucose: Secondary | ICD-10-CM | POA: Diagnosis not present

## 2021-03-11 DIAGNOSIS — H35033 Hypertensive retinopathy, bilateral: Secondary | ICD-10-CM | POA: Diagnosis not present

## 2021-03-11 DIAGNOSIS — E113313 Type 2 diabetes mellitus with moderate nonproliferative diabetic retinopathy with macular edema, bilateral: Secondary | ICD-10-CM | POA: Diagnosis not present

## 2021-03-17 DIAGNOSIS — L03011 Cellulitis of right finger: Secondary | ICD-10-CM | POA: Diagnosis not present

## 2021-03-17 DIAGNOSIS — G8929 Other chronic pain: Secondary | ICD-10-CM | POA: Diagnosis not present

## 2021-03-17 DIAGNOSIS — M25562 Pain in left knee: Secondary | ICD-10-CM | POA: Diagnosis not present

## 2021-03-17 DIAGNOSIS — M25561 Pain in right knee: Secondary | ICD-10-CM | POA: Diagnosis not present

## 2021-03-21 DIAGNOSIS — Z7901 Long term (current) use of anticoagulants: Secondary | ICD-10-CM | POA: Diagnosis not present

## 2021-03-21 DIAGNOSIS — Z7984 Long term (current) use of oral hypoglycemic drugs: Secondary | ICD-10-CM | POA: Diagnosis not present

## 2021-03-21 DIAGNOSIS — R251 Tremor, unspecified: Secondary | ICD-10-CM | POA: Diagnosis not present

## 2021-03-21 DIAGNOSIS — I252 Old myocardial infarction: Secondary | ICD-10-CM | POA: Diagnosis not present

## 2021-03-21 DIAGNOSIS — R059 Cough, unspecified: Secondary | ICD-10-CM | POA: Diagnosis not present

## 2021-03-21 DIAGNOSIS — Z20822 Contact with and (suspected) exposure to covid-19: Secondary | ICD-10-CM | POA: Diagnosis not present

## 2021-03-21 DIAGNOSIS — I517 Cardiomegaly: Secondary | ICD-10-CM | POA: Diagnosis not present

## 2021-03-21 DIAGNOSIS — Z794 Long term (current) use of insulin: Secondary | ICD-10-CM | POA: Diagnosis not present

## 2021-03-21 DIAGNOSIS — I10 Essential (primary) hypertension: Secondary | ICD-10-CM | POA: Diagnosis not present

## 2021-03-21 DIAGNOSIS — H8110 Benign paroxysmal vertigo, unspecified ear: Secondary | ICD-10-CM | POA: Diagnosis not present

## 2021-03-21 DIAGNOSIS — I4891 Unspecified atrial fibrillation: Secondary | ICD-10-CM | POA: Diagnosis not present

## 2021-03-21 DIAGNOSIS — R9431 Abnormal electrocardiogram [ECG] [EKG]: Secondary | ICD-10-CM | POA: Diagnosis not present

## 2021-03-21 DIAGNOSIS — I444 Left anterior fascicular block: Secondary | ICD-10-CM | POA: Diagnosis not present

## 2021-03-21 DIAGNOSIS — Z79899 Other long term (current) drug therapy: Secondary | ICD-10-CM | POA: Diagnosis not present

## 2021-03-21 DIAGNOSIS — I700 Atherosclerosis of aorta: Secondary | ICD-10-CM | POA: Diagnosis not present

## 2021-03-24 ENCOUNTER — Encounter: Payer: Self-pay | Admitting: Neurology

## 2021-03-24 DIAGNOSIS — H93A1 Pulsatile tinnitus, right ear: Secondary | ICD-10-CM | POA: Diagnosis not present

## 2021-03-24 DIAGNOSIS — Z1331 Encounter for screening for depression: Secondary | ICD-10-CM | POA: Diagnosis not present

## 2021-03-24 DIAGNOSIS — Z1389 Encounter for screening for other disorder: Secondary | ICD-10-CM | POA: Diagnosis not present

## 2021-03-24 DIAGNOSIS — R251 Tremor, unspecified: Secondary | ICD-10-CM | POA: Diagnosis not present

## 2021-03-30 DIAGNOSIS — M1712 Unilateral primary osteoarthritis, left knee: Secondary | ICD-10-CM | POA: Diagnosis not present

## 2021-03-30 DIAGNOSIS — M1711 Unilateral primary osteoarthritis, right knee: Secondary | ICD-10-CM | POA: Diagnosis not present

## 2021-03-31 NOTE — Progress Notes (Signed)
Assessment/Plan:   1.  Tremor, essential  -wonder if worse d/t d/c of coreg  -don't recommend any tx due to bradycardia with beta blocker.  Cannot be on primidone due to eliquis.  -he will check with PCP to see if TSH has been completed.  If not, he will request that be done  2.  Near syncope  -Suspect that this is all cardiac in nature and related to atrial fibrillation with slow ventricular response.  He needs to get follow-up back with Dr. Percival Spanish.  He has not seen him since the end of May when his carvedilol was discontinued for the same symptoms.  3.  Tinnitus (pulsatile) and sx's of VBI  -we will do MRA brain.  Prior MRI brain recently reviewed and with WMD.  Reviewed with patient  4.  Peripheral neuropathy  -The patient has clinical examination evidence of a diffuse peripheral neuropathy, which certainly can affect gait and balance.  We discussed safety associated with peripheral neuropathy.  this is likely due to DM and hx of alcohol abuse in past (now drinking 2 per day).  Recommend d/c EtOH and control of DM.  He is using the cane.   Subjective:   [name] was seen today in the movement disorders clinic for neurologic consultation at the request of Bonnita Hollow, MD.  The consultation is for the evaluation of tremor.  Notes available to me are reviewed.  Patient has been dealing with vertigo/near syncope and been in and out of the emergency room over the last few months regarding that.  He was admitted to the hospital for that in May and had a cardiac catheterization which demonstrated stenosis in 2/5 grafts and medical management was recommended.  He did have borderline bradycardia with some pauses, and it was felt that this could be contributing to some of his syncopal episodes.  Cardiology recommended decreasing his carvedilol and switched his Coumadin to Eliquis at that time.  He was also given a Zio patch.  He wore this and results of this indicated continuous atrial  fibrillation/flutter with up to a 4.7-second pause.  Slow ventricular rate was noted ranging from 28-123 beats per minute (average of 60 bpm).  Because of the slow ventricular rate.  Carvedilol was stopped.  Patient went back to [facility] emergency room for similar symptoms on July 10.  On that visit, he complained about dizziness, fatigue, nausea and mentioned that he had 1 day worth of tremor, per ED records.  Today, pt is with his wife and states that he has had tremor for maybe a few years but was worse that day.  Since that day in the ED, it is present but nothing like that day.    Tremor: Yes.     At rest or with activation?  activation  When is it noted the most?  Drinking, eating  Fam hx of tremor?  No.  Located where?  Bilateral UE - he is R handed  Affected by caffeine:  he states he doesn't drink any but wife tells him he drinks diet MT dew and he admits drinking 20 oz per day  Affected by alcohol:  No. (5-6 beers/week)  Affected by stress:  No.  Affected by fatigue:  No.  Spills soup if on spoon:  may or may not  Affects ADL's (tying shoes, brushing teeth, etc):  No.  Tremor inducing meds:  No.  Other Specific Symptoms:  Voice: no change Sleep: sleeps well with cpap  Vivid Dreams:  No.  Acting out dreams:  No. But has PLMD per wife Wet Pillows: No. Postural symptoms:  Yes.  , attributes some due to bad knees but isn't sure that is only source  Falls?  Yes.  , last one was in may with syncopal episode Bradykinesia symptoms:  wife notes L leg dragging Loss of smell:  No. Loss of taste:  No. Urinary Incontinence:  No. Difficulty Swallowing:  No. Handwriting, micrographia: No. Depression:  No. Memory changes:  No. N/V:  No. Lightheaded:  Yes.  , worse if looks up and some pulsatile tinnitus Diplopia:  No. Dyskinesia:  No.  Neuroimaging of the brain has  previously been performed.  It is available for my review today.  He had an MRI brain 01/2021 and I reviewed that.   There was mod WMD.    PREVIOUS MEDICATIONS: none to date  ALLERGIES:  No Known Allergies  CURRENT MEDICATIONS:  Current Outpatient Medications  Medication Instructions   apixaban (ELIQUIS) 5 mg, Oral, 2 times daily   aspirin EC 81 mg, Oral, Daily   glipiZIDE (GLUCOTROL) 10 mg, Oral, Daily before breakfast   Levemir FlexTouch 10 Units, Subcutaneous, 2 times daily   metFORMIN (GLUCOPHAGE-XR) 1,000 mg, Oral, 2 times daily   rosuvastatin (CRESTOR) 10 mg, Oral, See admin instructions, Takes 10 mg on Mondays and Fridays   Trulicity 7.37 mg, Subcutaneous, Weekly   valsartan (DIOVAN) 160 mg, Oral, Daily   vitamin B-12 (CYANOCOBALAMIN) 1,000 mcg, Oral, Daily    Objective:   PHYSICAL EXAMINATION:    VITALS:   Vitals:   04/01/21 0925  BP: 138/62  Pulse: 61  SpO2: 98%  Weight: 185 lb (83.9 kg)  Height: 5\' 9"  (1.753 m)    GEN:  The patient appears stated age and is in NAD. HEENT:  Normocephalic, atraumatic.  The mucous membranes are moist. The superficial temporal arteries are without ropiness or tenderness. CV:  RRR Lungs:  CTAB Neck/HEME:  There are no carotid bruits bilaterally.  Neurological examination:  Orientation: The patient is alert and oriented x3.  Cranial nerves: There is good facial symmetry.  Extraocular muscles are intact. The visual fields are full to confrontational testing. The speech is fluent and clear. Soft palate rises symmetrically and there is no tongue deviation. Hearing is intact to conversational tone. Sensation: Sensation is intact to light touch throughout (facial, trunk, extremities). Vibration is decreased distally. There is no extinction with double simultaneous stimulation.  Motor: Strength is 5/5 in the bilateral upper and lower extremities.   Shoulder shrug is equal and symmetric.  There is no pronator drift. Deep tendon reflexes: Deep tendon reflexes are 0-1/4 at the bilateral biceps, triceps, brachioradialis, patella and achilles. Plantar  responses are downgoing bilaterally.  Movement examination: Tone: There is nl tone in the bilateral upper extremities.  The tone in the lower extremities is nl.  Abnormal movements: no rest tremor.  There is postural tremor, L>R.   Coordination:  There is no decremation with RAM's, with any form of RAMS, including alternating supination and pronation of the forearm, hand opening and closing, finger taps, heel taps and toe taps. Gait and Station: The patient has no difficulty arising out of a deep-seated chair without the use of the hands. The patient's stride length is good but he is wide based and he holds the cane when he walks.   I have reviewed and interpreted the following labs independently   Chemistry      Component Value Date/Time  NA 134 (L) 01/22/2021 0058   K 3.4 (L) 01/22/2021 0058   CL 100 01/22/2021 0058   CO2 27 01/22/2021 0058   BUN 18 01/22/2021 0058   CREATININE 1.09 01/22/2021 0058      Component Value Date/Time   CALCIUM 8.7 (L) 01/22/2021 0058   ALKPHOS 91 01/19/2021 2031   AST 17 01/19/2021 2031   ALT 19 01/19/2021 2031   BILITOT 1.2 01/19/2021 2031      No results found for: TSH Lab Results  Component Value Date   WBC 6.1 01/22/2021   HGB 11.2 (L) 01/22/2021   HCT 34.1 (L) 01/22/2021   MCV 91.7 01/22/2021   PLT 133 (L) 01/22/2021      Total time spent on today's visit was 45 minutes, including both face-to-face time and nonface-to-face time.  Time included that spent on review of records (prior notes available to me/labs/imaging if pertinent), discussing treatment and goals, answering patient's questions and coordinating care.  Cc:  Bonnita Hollow, MD

## 2021-04-01 ENCOUNTER — Telehealth: Payer: Self-pay | Admitting: Neurology

## 2021-04-01 ENCOUNTER — Other Ambulatory Visit: Payer: Self-pay

## 2021-04-01 ENCOUNTER — Ambulatory Visit (INDEPENDENT_AMBULATORY_CARE_PROVIDER_SITE_OTHER): Payer: Medicare Other | Admitting: Neurology

## 2021-04-01 ENCOUNTER — Encounter: Payer: Self-pay | Admitting: Neurology

## 2021-04-01 VITALS — BP 138/62 | HR 61 | Ht 69.0 in | Wt 185.0 lb

## 2021-04-01 DIAGNOSIS — H93A1 Pulsatile tinnitus, right ear: Secondary | ICD-10-CM

## 2021-04-01 DIAGNOSIS — G250 Essential tremor: Secondary | ICD-10-CM

## 2021-04-01 NOTE — Telephone Encounter (Signed)
Wife stated they will need orders sent over to husbands pcp before they will do labs.Any questions call [phone]. Fax number is [phone]

## 2021-04-01 NOTE — Patient Instructions (Signed)
A referral to [facility] has been placed for your MRA someone will contact you directly to schedule your appt. They are located at [place]. Please contact them directly by calling [phone] with any questions regarding your referral.  Check with your primary care provider to see if your thyroid has been checked (TSH).  If not, we would recommend that be done.    Essential Tremor A tremor is trembling or shaking that a person cannot control. Most tremors affect the hands or arms. Tremors can also affect the head, vocal cords, legs, and other parts of the body. Essential tremor is a tremor without a known cause. Usually, it occurs while a person is trying to perform an action. Ittends to get worse gradually as a person ages. What are the causes? The cause of this condition is not known. What increases the risk? You are more likely to develop this condition if: You have a family member with essential tremor. You are age 10 or older. You take certain medicines. What are the signs or symptoms? The main sign of a tremor is a rhythmic shaking of certain parts of your body that is uncontrolled and unintentional. You may: Have difficulty eating with a spoon or fork. Have difficulty writing. Nod your head up and down or side to side. Have a quivering voice. The shaking may: Get worse over time. Come and go. Be more noticeable on one side of your body. Get worse due to stress, fatigue, caffeine, and extreme heat or cold. How is this diagnosed? This condition may be diagnosed based on: Your symptoms and medical history. A physical exam. There is no single test to diagnose an essential tremor. However, your health care provider may order tests to rule out other causes of your condition. These may include: Blood and urine tests. Imaging studies of your brain, such as CT scan and MRI. A test that measures involuntary muscle movement (electromyogram). How is this  treated? Treatment for essential tremor depends on the severity of the condition. Some tremors may go away without treatment. Mild tremors may not need treatment if they do not affect your day-to-day life. Severe tremors may need to be treated using one or more of the following options: Medicines. Lifestyle changes. Occupational or physical therapy. Follow these instructions at home: Lifestyle  Do not use any products that contain nicotine or tobacco, such as cigarettes and e-cigarettes. If you need help quitting, ask your health care provider. Limit your caffeine intake as told by your health care provider. Try to get 8 hours of sleep each night. Find ways to manage your stress that fits your lifestyle and personality. Consider trying meditation or yoga. Try to anticipate stressful situations and allow extra time to manage them. If you are struggling emotionally with the effects of your tremor, consider working with a mental health provider.  General instructions Take over-the-counter and prescription medicines only as told by your health care provider. Avoid extreme heat and extreme cold. Keep all follow-up visits as told by your health care provider. This is important. Visits may include physical therapy visits. Contact a health care provider if: You experience any changes in the location or intensity of your tremors. You start having a tremor after starting a new medicine. You have tremor with other symptoms, such as: Numbness. Tingling. Pain. Weakness. Your tremor gets worse. Your tremor interferes with your daily life. You feel down, blue, or sad for at least 2 weeks in a row. Worrying  about your tremor and what other people think about you interferes with your everyday life functions, including relationships, work, or school. Summary Essential tremor is a tremor without a known cause. Usually, it occurs when you are trying to perform an action. You are more likely to  develop this condition if you have a family member with essential tremor. The main sign of a tremor is a rhythmic shaking of certain parts of your body that is uncontrolled and unintentional. Treatment for essential tremor depends on the severity of the condition. This information is not intended to replace advice given to you by your health care provider. Make sure you discuss any questions you have with your healthcare provider. Document Revised: 05/22/2020 Document Reviewed: 05/22/2020 Elsevier Patient Education  2022 [employer].

## 2021-04-02 NOTE — Telephone Encounter (Signed)
See last note.  Does that mean that he has never had a TSH done (that is unusual for a PCP office)?  If not, okay to send them order if they won't do it.  If they have done it, just request copy

## 2021-04-05 ENCOUNTER — Other Ambulatory Visit: Payer: Self-pay

## 2021-04-05 DIAGNOSIS — H93A1 Pulsatile tinnitus, right ear: Secondary | ICD-10-CM

## 2021-04-05 DIAGNOSIS — G250 Essential tremor: Secondary | ICD-10-CM

## 2021-04-05 NOTE — Telephone Encounter (Signed)
Called patients wife and she informed me that when she called PCP they asked her to have our office send a TSH order. Order created and faxed.

## 2021-04-08 DIAGNOSIS — E039 Hypothyroidism, unspecified: Secondary | ICD-10-CM | POA: Diagnosis not present

## 2021-04-11 ENCOUNTER — Ambulatory Visit
Admission: RE | Admit: 2021-04-11 | Discharge: 2021-04-11 | Disposition: A | Discharge status: Home / Self Care | Payer: Medicare Other | Source: Ambulatory Visit | Attending: Neurology | Admitting: Neurology

## 2021-04-11 ENCOUNTER — Other Ambulatory Visit: Payer: Self-pay

## 2021-04-11 DIAGNOSIS — I6602 Occlusion and stenosis of left middle cerebral artery: Secondary | ICD-10-CM | POA: Diagnosis not present

## 2021-04-11 DIAGNOSIS — I6622 Occlusion and stenosis of left posterior cerebral artery: Secondary | ICD-10-CM | POA: Diagnosis not present

## 2021-04-11 DIAGNOSIS — Z7984 Long term (current) use of oral hypoglycemic drugs: Secondary | ICD-10-CM | POA: Diagnosis not present

## 2021-04-11 DIAGNOSIS — E1165 Type 2 diabetes mellitus with hyperglycemia: Secondary | ICD-10-CM | POA: Diagnosis not present

## 2021-04-11 DIAGNOSIS — R410 Disorientation, unspecified: Secondary | ICD-10-CM | POA: Diagnosis not present

## 2021-04-11 DIAGNOSIS — M170 Bilateral primary osteoarthritis of knee: Secondary | ICD-10-CM | POA: Diagnosis not present

## 2021-04-11 DIAGNOSIS — I10 Essential (primary) hypertension: Secondary | ICD-10-CM | POA: Diagnosis not present

## 2021-04-11 IMAGING — MR MR MRA HEAD W/O CM
1 of 2 series · 10 of 48 positions shown · non-contrast
Comparison: Previous brain MRI from [DATE].

CLINICAL DATA: Initial evaluation for tendon talus, confusion,
depression, weakness.

EXAM:
MRA HEAD WITHOUT CONTRAST
TECHNIQUE: Angiographic images of the Circle of Willis were acquired using MRA
technique without intravenous contrast.

[Series 5: [id number] · axial · 0.6mm · 0.26mm/px · z∈[-90,+4]mm · 10 of 175 slices shown]
[im 9/175]
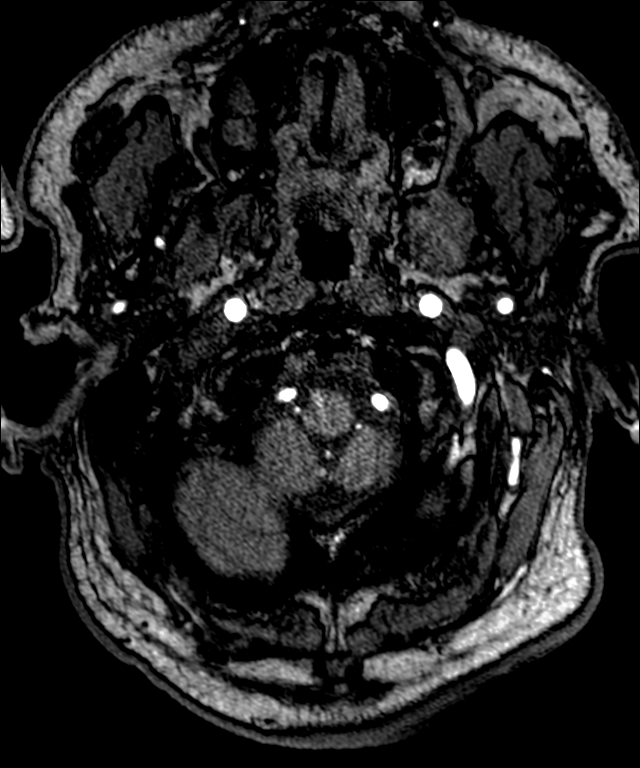
[im 30/175]
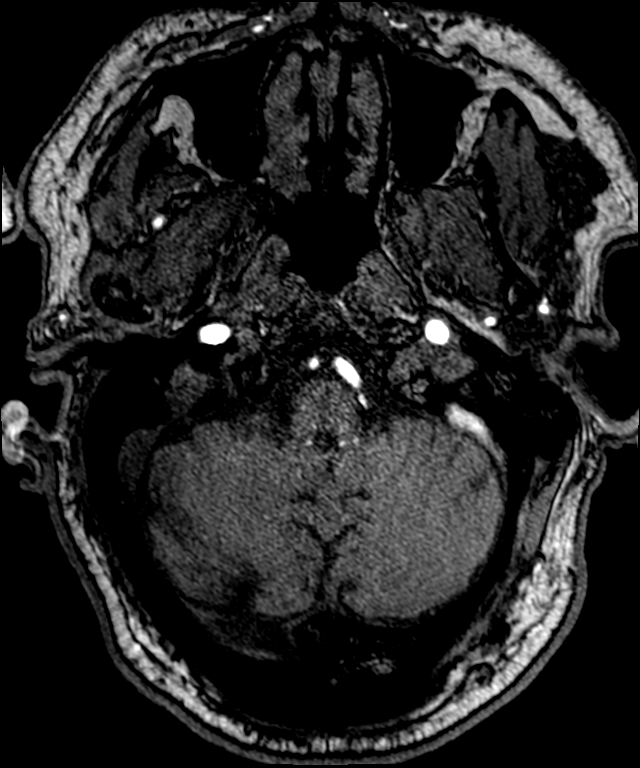
[im 56/175]
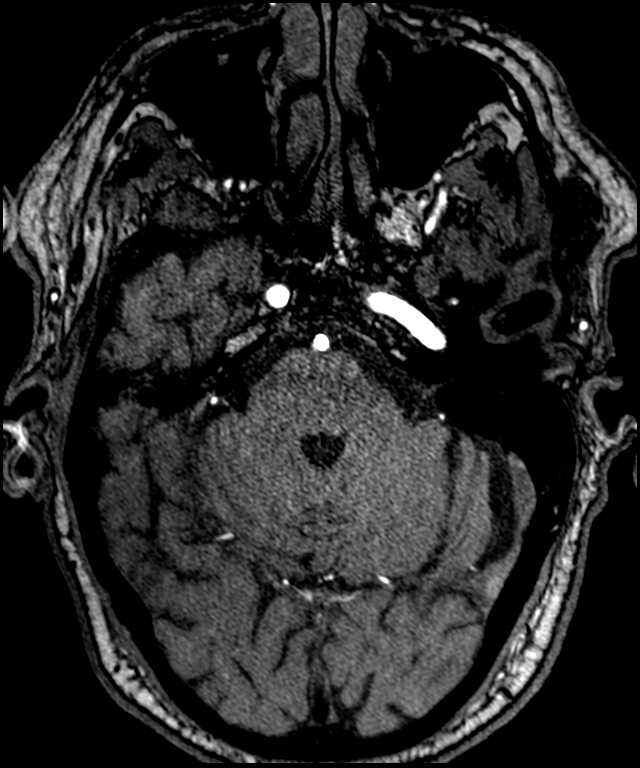
[im 77/175]
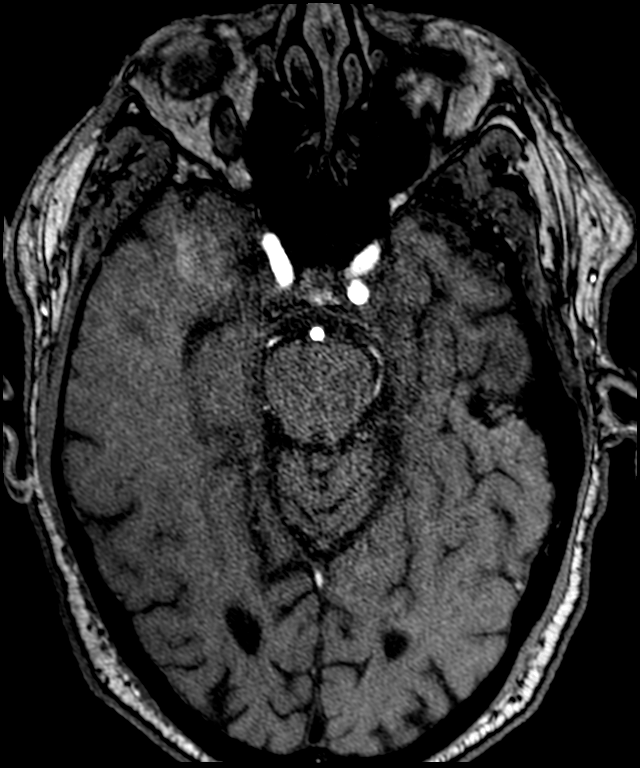
[im 90/175]
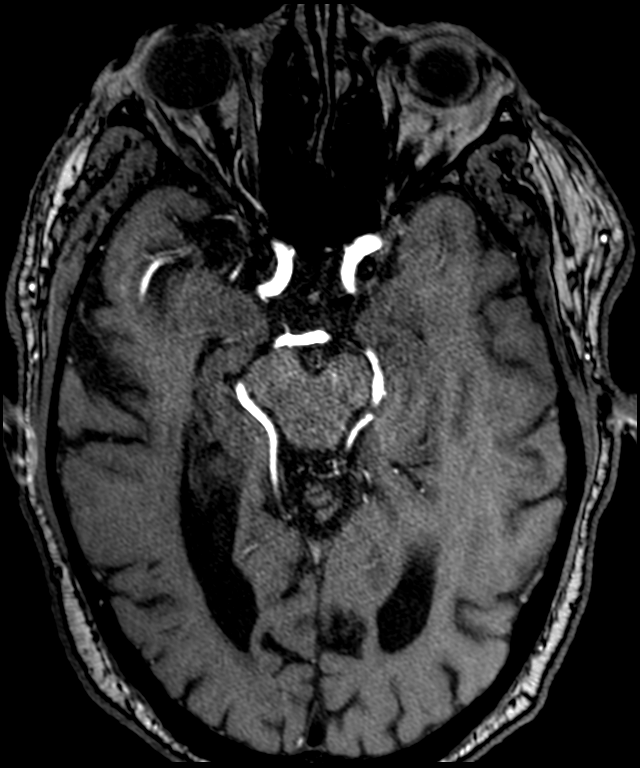
[im 98/175]
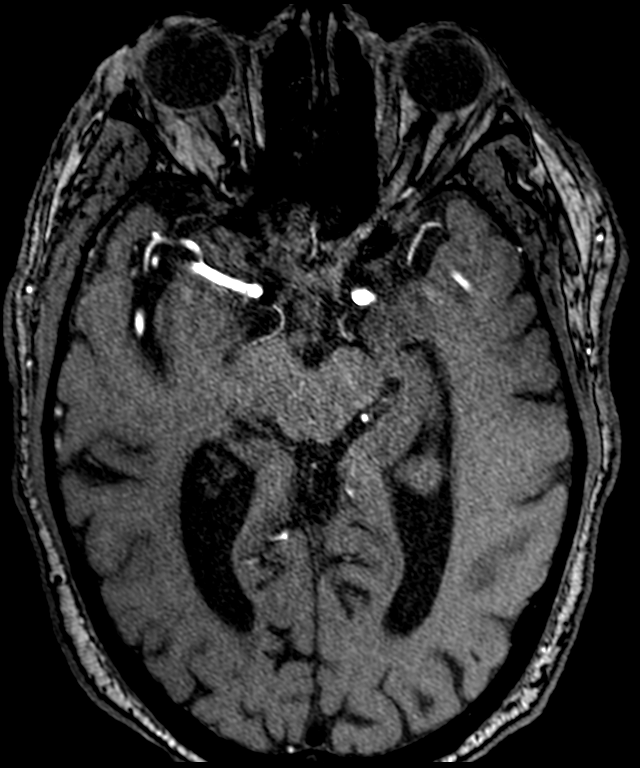
[im 119/175]
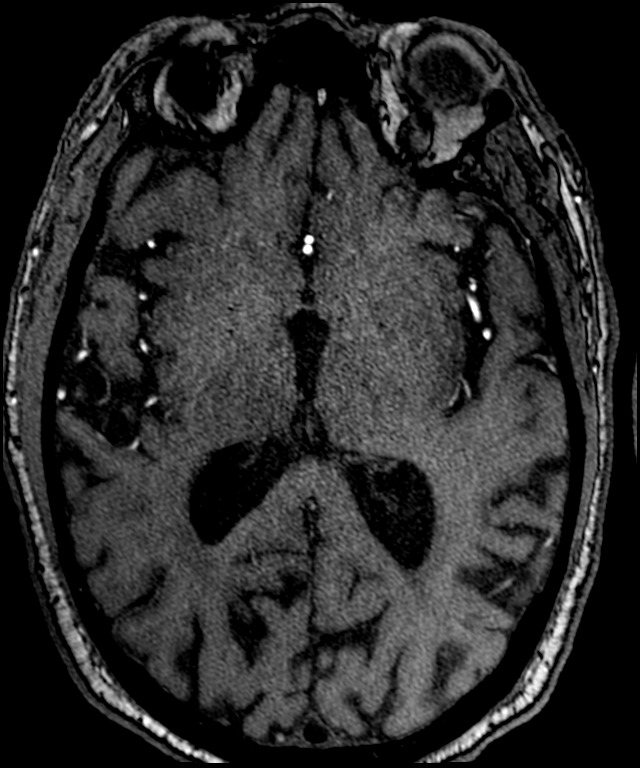
[im 145/175]
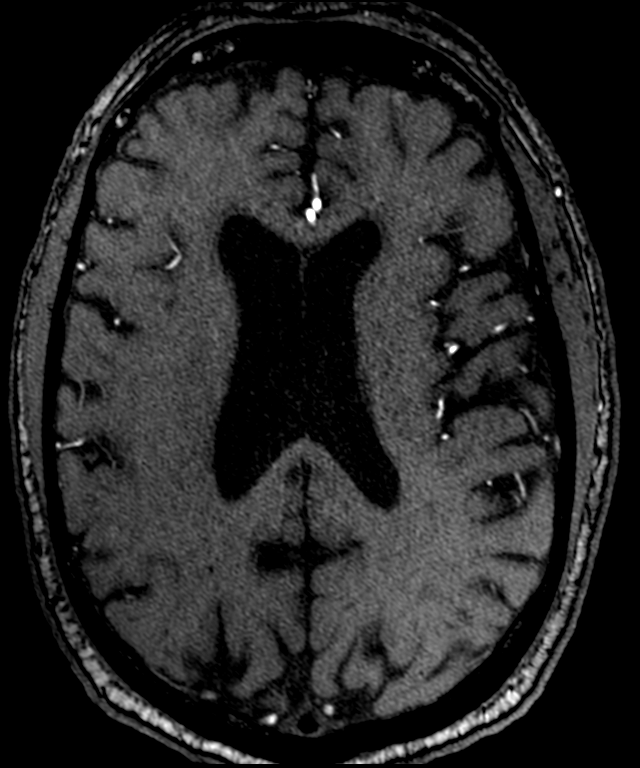
[im 149/175]
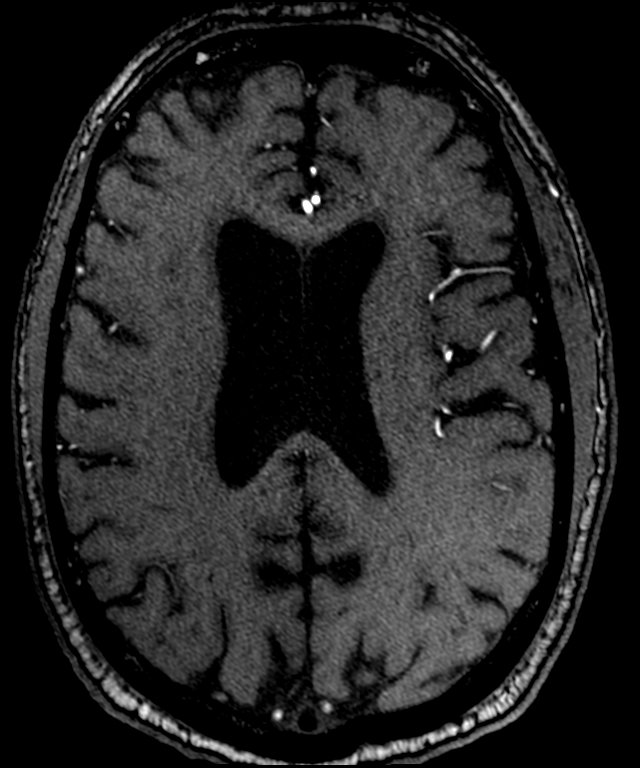
[im 166/175]
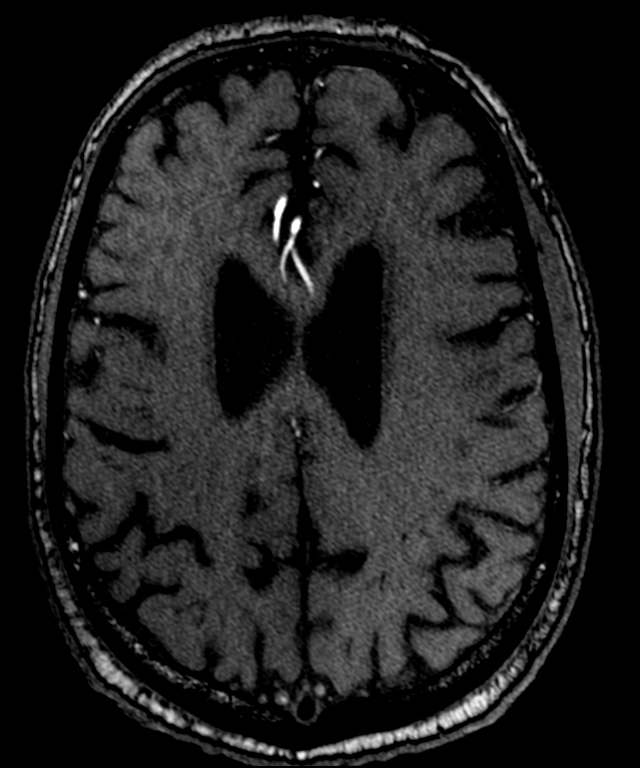

[10 of 48 positions shown; findings below may reference images not displayed]

FINDINGS: Anterior circulation: Both internal carotid arteries widely patent
to the termini without stenosis. A1 segments widely patent. Normal
anterior communicating artery complex. Both anterior cerebral
arteries widely patent to their distal aspects without stenosis.
Moderate stenosis noted at the origin of the left M1 segment (series
105, image 10). Possible additional mild short-segment stenosis
versus short-segment fenestration noted at the origin of the right
M1 (series 105, image 10). M1 segments otherwise widely patent.
Normal MCA bifurcations. Distal MCA branches well perfused.

Posterior circulation: Both vertebral arteries patent to the
vertebrobasilar junction without stenosis. Left vertebral artery
dominant. Both PICA origins patent and normal. Short-segment
fenestration involving the proximal basilar artery noted. Basilar
widely patent distally without stenosis. Superior cerebellar
arteries patent bilaterally. Both PCAs primarily supplied via the
basilar. Right PCA widely patent to its distal aspect. Short-segment
moderate stenosis involving the mid left P2 segment (series 103,
image 4). Left PCA otherwise patent distally.

Anatomic variants: Dominant left vertebral artery.

Other: No intracranial aneurysm or other vascular abnormality.
IMPRESSION: 1. Short-segment moderate stenosis at the origin of the left M1
segment, with additional moderate left P2 stenosis.
2. Short-segment mild stenosis versus fenestration at the origin of
the right M1 segment.
3. Otherwise normal intracranial MRA. No large vessel occlusion. No
other hemodynamically significant or correctable stenosis.

## 2021-04-14 NOTE — Progress Notes (Signed)
Called patient and left a message for a call back.  

## 2021-04-15 ENCOUNTER — Telehealth: Payer: Self-pay | Admitting: Neurology

## 2021-04-15 NOTE — Telephone Encounter (Signed)
Patient advised of MRA results from 04/12/2021. Voiced understand an advised.

## 2021-04-15 NOTE — Telephone Encounter (Signed)
Pt called in, said he is returning someone's call. He doesn't know what it is about or who it was.

## 2021-04-19 ENCOUNTER — Ambulatory Visit: Payer: Medicare Other | Admitting: Neurology

## 2021-04-27 DIAGNOSIS — E1140 Type 2 diabetes mellitus with diabetic neuropathy, unspecified: Secondary | ICD-10-CM | POA: Diagnosis not present

## 2021-04-27 DIAGNOSIS — L609 Nail disorder, unspecified: Secondary | ICD-10-CM | POA: Diagnosis not present

## 2021-04-29 DIAGNOSIS — E113313 Type 2 diabetes mellitus with moderate nonproliferative diabetic retinopathy with macular edema, bilateral: Secondary | ICD-10-CM | POA: Diagnosis not present

## 2021-05-12 DIAGNOSIS — Z7984 Long term (current) use of oral hypoglycemic drugs: Secondary | ICD-10-CM | POA: Diagnosis not present

## 2021-05-12 DIAGNOSIS — E1165 Type 2 diabetes mellitus with hyperglycemia: Secondary | ICD-10-CM | POA: Diagnosis not present

## 2021-05-12 DIAGNOSIS — I10 Essential (primary) hypertension: Secondary | ICD-10-CM | POA: Diagnosis not present

## 2021-05-12 DIAGNOSIS — M170 Bilateral primary osteoarthritis of knee: Secondary | ICD-10-CM | POA: Diagnosis not present

## 2021-06-11 DIAGNOSIS — Z7984 Long term (current) use of oral hypoglycemic drugs: Secondary | ICD-10-CM | POA: Diagnosis not present

## 2021-06-11 DIAGNOSIS — M170 Bilateral primary osteoarthritis of knee: Secondary | ICD-10-CM | POA: Diagnosis not present

## 2021-06-11 DIAGNOSIS — I10 Essential (primary) hypertension: Secondary | ICD-10-CM | POA: Diagnosis not present

## 2021-06-11 DIAGNOSIS — E1165 Type 2 diabetes mellitus with hyperglycemia: Secondary | ICD-10-CM | POA: Diagnosis not present

## 2021-06-17 DIAGNOSIS — H3582 Retinal ischemia: Secondary | ICD-10-CM | POA: Diagnosis not present

## 2021-06-17 DIAGNOSIS — E113313 Type 2 diabetes mellitus with moderate nonproliferative diabetic retinopathy with macular edema, bilateral: Secondary | ICD-10-CM | POA: Diagnosis not present

## 2021-06-17 DIAGNOSIS — H35033 Hypertensive retinopathy, bilateral: Secondary | ICD-10-CM | POA: Diagnosis not present

## 2021-06-21 ENCOUNTER — Telehealth: Payer: Self-pay | Admitting: Cardiology

## 2021-06-21 MED ORDER — APIXABAN 5 MG PO TABS: 5.0000 mg | ORAL_TABLET | Freq: Two times a day (BID) | ORAL | 5 refills | Status: DC

## 2021-06-21 NOTE — Telephone Encounter (Signed)
Pt c/o medication issue:  1. Name of Medication: apixaban (ELIQUIS) 5 MG TABS tablet  2. How are you currently taking this medication (dosage and times per day)? 5 mg twice daily  3. Are you having a reaction (difficulty breathing--STAT)? no  4. What is your medication issue? Medication is too expensive.  Patient wanted to know if Dr. Percival Spanish would switch the patient from Eliquis to Benjamin 5 mg. The patient can not afford the eliquis  at this time

## 2021-06-21 NOTE — Telephone Encounter (Signed)
Patient calling the office for samples of medication:   1.  What medication and dosage are you requesting samples for? apixaban (ELIQUIS) 5 MG TABS tablet  2.  Are you currently out of this medication?  Patient will be out after his evening dose 06/22/21

## 2021-06-21 NOTE — Telephone Encounter (Signed)
Pt can no longer afford eliquis and would like to be switched to coumadin.

## 2021-06-21 NOTE — Telephone Encounter (Signed)
Spoke with pt to relay information regarding eliquis prescription. Pt to get Eliquis for $10/month for the remainder of the year. Pt is very thankful and verbalizes understanding.

## 2021-06-21 NOTE — Telephone Encounter (Signed)
Looks as though pt has Pharmacist, community in which case he can use copay card for Eliquis to bring his copay down to $10/month. Called pharmacy, they only have his [facility] Medicare prescription card on file, not his commercial card through [employer]. I provided them with his other rx insurance, Eliquis copay $110 but I also activated a copay card and confirmed his cost is now $10/month (ID [id number]).  Please advise pt that he can continue on Eliquis and that his copay from now on will be $10/month. I am not sure if he knew he had other [employer] prescription insurance but it's with a commercial plan so he should be able to use copay cards for any of his other branded medications if the pharmacy runs the rx through that plan and not his part D plan.

## 2021-07-06 DIAGNOSIS — L609 Nail disorder, unspecified: Secondary | ICD-10-CM | POA: Diagnosis not present

## 2021-07-06 DIAGNOSIS — E1140 Type 2 diabetes mellitus with diabetic neuropathy, unspecified: Secondary | ICD-10-CM | POA: Diagnosis not present

## 2021-07-08 DIAGNOSIS — Z23 Encounter for immunization: Secondary | ICD-10-CM | POA: Diagnosis not present

## 2021-07-12 DIAGNOSIS — I10 Essential (primary) hypertension: Secondary | ICD-10-CM | POA: Diagnosis not present

## 2021-07-12 DIAGNOSIS — E1165 Type 2 diabetes mellitus with hyperglycemia: Secondary | ICD-10-CM | POA: Diagnosis not present

## 2021-07-12 DIAGNOSIS — Z7984 Long term (current) use of oral hypoglycemic drugs: Secondary | ICD-10-CM | POA: Diagnosis not present

## 2021-07-12 DIAGNOSIS — M170 Bilateral primary osteoarthritis of knee: Secondary | ICD-10-CM | POA: Diagnosis not present

## 2021-07-21 DIAGNOSIS — E1165 Type 2 diabetes mellitus with hyperglycemia: Secondary | ICD-10-CM | POA: Diagnosis not present

## 2021-07-21 DIAGNOSIS — I10 Essential (primary) hypertension: Secondary | ICD-10-CM | POA: Diagnosis not present

## 2021-07-27 DIAGNOSIS — Z23 Encounter for immunization: Secondary | ICD-10-CM | POA: Diagnosis not present

## 2021-08-11 DIAGNOSIS — Z7984 Long term (current) use of oral hypoglycemic drugs: Secondary | ICD-10-CM | POA: Diagnosis not present

## 2021-08-11 DIAGNOSIS — I10 Essential (primary) hypertension: Secondary | ICD-10-CM | POA: Diagnosis not present

## 2021-08-11 DIAGNOSIS — E1165 Type 2 diabetes mellitus with hyperglycemia: Secondary | ICD-10-CM | POA: Diagnosis not present

## 2021-08-11 DIAGNOSIS — M170 Bilateral primary osteoarthritis of knee: Secondary | ICD-10-CM | POA: Diagnosis not present

## 2021-08-12 DIAGNOSIS — E113313 Type 2 diabetes mellitus with moderate nonproliferative diabetic retinopathy with macular edema, bilateral: Secondary | ICD-10-CM | POA: Diagnosis not present

## 2021-08-12 DIAGNOSIS — H31093 Other chorioretinal scars, bilateral: Secondary | ICD-10-CM | POA: Diagnosis not present

## 2021-08-12 DIAGNOSIS — H3562 Retinal hemorrhage, left eye: Secondary | ICD-10-CM | POA: Diagnosis not present

## 2021-08-12 DIAGNOSIS — H3582 Retinal ischemia: Secondary | ICD-10-CM | POA: Diagnosis not present

## 2021-09-10 DIAGNOSIS — M170 Bilateral primary osteoarthritis of knee: Secondary | ICD-10-CM | POA: Diagnosis not present

## 2021-09-10 DIAGNOSIS — Z7984 Long term (current) use of oral hypoglycemic drugs: Secondary | ICD-10-CM | POA: Diagnosis not present

## 2021-09-10 DIAGNOSIS — I10 Essential (primary) hypertension: Secondary | ICD-10-CM | POA: Diagnosis not present

## 2021-09-10 DIAGNOSIS — E1165 Type 2 diabetes mellitus with hyperglycemia: Secondary | ICD-10-CM | POA: Diagnosis not present

## 2021-09-14 DIAGNOSIS — E785 Hyperlipidemia, unspecified: Secondary | ICD-10-CM | POA: Insufficient documentation

## 2021-09-14 DIAGNOSIS — L609 Nail disorder, unspecified: Secondary | ICD-10-CM | POA: Diagnosis not present

## 2021-09-14 DIAGNOSIS — E1140 Type 2 diabetes mellitus with diabetic neuropathy, unspecified: Secondary | ICD-10-CM | POA: Diagnosis not present

## 2021-09-14 NOTE — Progress Notes (Signed)
Cardiology Office Note   Date:  09/15/2021   ID:  Thomas, [name] 11-12-1950, MRN [id number]  PCP:  Bonnita Hollow, MD  Cardiologist:   Shelva Majestic, MD   Chief Complaint  Patient presents with   Tremors      History of Present Illness: [name] is a 71 y.o. male who for follow up of CAD.      He was hospitalized for syncope.  He had a cath as below.  3 out of 5 grafts were patent.  The sequential vein to the ramus branch and obtuse marginal branch was occluded at its origin.  Sequential vein to the PDA and PLA was widely patent as was the LIMA to the LAD.  He was managed medically.  Cath demonstrated an EF of 50%.  There was apical hypokinesis and mild LVH.  He had moderately elevated pulmonary pressures.  There was mild MR.   He wore a monitor and the atrial fib has a slow heart rate in the 30s.     Since I last saw him he has done okay.  He has seen neurology who is diagnosed him with essential tremor.   I did take him off carvedilol previously because of dizziness and is felt better since.   He denies any chest pressure, arm discomfort.  He is not palpitations, presyncope or syncope.  He has had less dizziness.  He has had no PND orthopnea.    Past Medical History:  Diagnosis Date   A-fib ([place])    CAD (coronary artery disease)    Hypertension    Obstructive sleep apnea    CPAP   Rosacea    Type 2 diabetes mellitus with hyperglycemia (HCC)     Past Surgical History:  Procedure Laterality Date   CORONARY ARTERY BYPASS GRAFT     LIMA to the LAD, sequential SVG to ramus intermediate and OM1, sequential SVG to PDA and posterior lateral   RIGHT/LEFT HEART CATH AND CORONARY/GRAFT ANGIOGRAPHY N/A 01/21/2021   Procedure: RIGHT/LEFT HEART CATH AND CORONARY/GRAFT ANGIOGRAPHY;  Surgeon: Lorretta Harp, MD;  Location: [place] [facility];  Service: Cardiovascular;  Laterality: N/A;     Current Outpatient Medications  Medication Sig Dispense Refill   amLODipine  (NORVASC) 5 MG tablet Take 1 tablet (5 mg total) by mouth daily. 90 tablet 3   apixaban (ELIQUIS) 5 MG TABS tablet Take 1 tablet (5 mg total) by mouth 2 (two) times daily. 60 tablet 5   aspirin EC 81 MG tablet Take 81 mg by mouth daily.     Dulaglutide (TRULICITY) 3.50 KX/3.8HW SOPN Inject 0.75 mg into the skin once a week.     glipiZIDE (GLUCOTROL) 10 MG tablet Take 10 mg by mouth daily before breakfast.     LEVEMIR FLEXTOUCH 100 UNIT/ML FlexPen Inject 10 Units into the skin 2 (two) times daily.     metFORMIN (GLUCOPHAGE-XR) 500 MG 24 hr tablet Take 1,000 mg by mouth 2 (two) times daily.     rosuvastatin (CRESTOR) 10 MG tablet Take 10 mg by mouth See admin instructions. Takes 10 mg on Mondays and Fridays     vitamin B-12 (CYANOCOBALAMIN) 1000 MCG tablet Take 1,000 mcg by mouth daily.     valsartan (DIOVAN) 320 MG tablet Take 320 mg by mouth daily.     No current facility-administered medications for this visit.    Allergies:   Patient has no known allergies.    ROS:  Please see  the history of present illness.   Otherwise, review of systems are positive for none.   All other systems are reviewed and negative.    PHYSICAL EXAM: VS:  BP (!) 162/80    Pulse 82    Ht 5\' 9"  (1.753 m)    Wt 191 lb 12.8 oz (87 kg)    SpO2 96%    BMI 28.32 kg/m  , BMI Body mass index is 28.32 kg/m. GENERAL:  Well appearing NECK:  No jugular venous distention, waveform within normal limits, carotid upstroke brisk and symmetric, no bruits, no thyromegaly LUNGS:  Clear to auscultation bilaterally CHEST:  Unremarkable HEART:  PMI not displaced or sustained,S1 and S2 within normal limits, no S3, no clicks, no rubs, no murmurs, irregular  ABD:  Flat, positive bowel sounds normal in frequency in pitch, no bruits, no rebound, no guarding, no midline pulsatile mass, no hepatomegaly, no splenomegaly EXT:  2 plus pulses throughout, no edema, no cyanosis no clubbing   EKG:  EKG is NOt ordered today.   CARDIAC  CATH:   Dominance: Right      Recent Labs: 01/19/2021: ALT 19; B Natriuretic Peptide 1,236.8 01/21/2021: Magnesium 2.0 01/22/2021: BUN 18; Creatinine, Ser 1.09; Hemoglobin 11.2; Platelets 133; Potassium 3.4; Sodium 134    Lipid Panel    Component Value Date/Time   CHOL 106 01/20/2021 0026   TRIG 70 01/20/2021 0026   HDL 24 (L) 01/20/2021 0026   CHOLHDL 4.4 01/20/2021 0026   VLDL 14 01/20/2021 0026   LDLCALC 68 01/20/2021 0026      Wt Readings from Last 3 Encounters:  09/15/21 191 lb 12.8 oz (87 kg)  04/01/21 185 lb (83.9 kg)  02/05/21 184 lb 12.8 oz (83.8 kg)      Other studies Reviewed: Additional studies/ records that were reviewed today include: Labs Review of the above records demonstrates:  Please see elsewhere in the note.     ASSESSMENT AND PLAN:  CAD:   The patient has no new sypmtoms.  No further cardiovascular testing is indicated.  We will continue with aggressive risk reduction and meds as listed.  CHRONIC ATRIAL FIB:   He tolerates anticoagulation.   [name] has a CHA2DS2 - VASc score of 5.   HTN:  The blood pressure is elevated and I am going to add amlodipine 5 daily.   DYSLIPIDEMIA:   LDL is 68 with an HDL of 24.  No change in therapy.   DM: A1c was 7.8 and he has had his meds changed recently  PULMONARY HTN:  The PA pressure was 34 on right heart cath.  I will follow this clinically.   Current medicines are reviewed at length with the patient today.  The patient does not have concerns regarding medicines.  The following changes have been made: As above  Labs/ tests ordered today include:   No orders of the defined types were placed in this encounter.    Disposition:   FU with me in 12 months     Signed, Minus Breeding, MD  09/15/2021 2:40 PM    [facility]

## 2021-09-15 ENCOUNTER — Encounter: Payer: Self-pay | Admitting: Cardiology

## 2021-09-15 ENCOUNTER — Other Ambulatory Visit: Payer: Self-pay

## 2021-09-15 ENCOUNTER — Ambulatory Visit (INDEPENDENT_AMBULATORY_CARE_PROVIDER_SITE_OTHER): Payer: Medicare Other | Admitting: Cardiology

## 2021-09-15 VITALS — BP 162/80 | HR 82 | Ht 69.0 in | Wt 191.8 lb

## 2021-09-15 DIAGNOSIS — I2510 Atherosclerotic heart disease of native coronary artery without angina pectoris: Secondary | ICD-10-CM

## 2021-09-15 DIAGNOSIS — E118 Type 2 diabetes mellitus with unspecified complications: Secondary | ICD-10-CM | POA: Diagnosis not present

## 2021-09-15 DIAGNOSIS — E785 Hyperlipidemia, unspecified: Secondary | ICD-10-CM

## 2021-09-15 DIAGNOSIS — I272 Pulmonary hypertension, unspecified: Secondary | ICD-10-CM | POA: Diagnosis not present

## 2021-09-15 DIAGNOSIS — I482 Chronic atrial fibrillation, unspecified: Secondary | ICD-10-CM

## 2021-09-15 DIAGNOSIS — I10 Essential (primary) hypertension: Secondary | ICD-10-CM | POA: Diagnosis not present

## 2021-09-15 MED ORDER — AMLODIPINE BESYLATE 5 MG PO TABS: 5.0000 mg | ORAL_TABLET | Freq: Every day | ORAL | 3 refills | Status: DC

## 2021-09-15 NOTE — Patient Instructions (Signed)
Medication Instructions:  Please start Amlodipine 5 mg a day. Continue all other medications as listed.  *If you need a refill on your cardiac medications before your next appointment, please call your pharmacy*  Follow-Up: At [facility], you and your health needs are our priority.  As part of our continuing mission to provide you with exceptional heart care, we have created designated Provider Care Teams.  These Care Teams include your primary Cardiologist (physician) and Advanced Practice Providers (APPs -  Physician Assistants and Nurse Practitioners) who all work together to provide you with the care you need, when you need it.  We recommend signing up for the patient portal called "MyChart".  Sign up information is provided on this After Visit Summary.  MyChart is used to connect with patients for Virtual Visits (Telemedicine).  Patients are able to view lab/test results, encounter notes, upcoming appointments, etc.  Non-urgent messages can be sent to your provider as well.   To learn more about what you can do with MyChart, go to [email or web address].    Your next appointment:   1 year(s)  The format for your next appointment:   In Person  Provider:   Minus Breeding, MD    Thank you for choosing [facility]!!

## 2021-09-30 DIAGNOSIS — E113313 Type 2 diabetes mellitus with moderate nonproliferative diabetic retinopathy with macular edema, bilateral: Secondary | ICD-10-CM | POA: Diagnosis not present

## 2021-10-10 DIAGNOSIS — I10 Essential (primary) hypertension: Secondary | ICD-10-CM | POA: Diagnosis not present

## 2021-10-10 DIAGNOSIS — E1165 Type 2 diabetes mellitus with hyperglycemia: Secondary | ICD-10-CM | POA: Diagnosis not present

## 2021-10-21 DIAGNOSIS — E7801 Familial hypercholesterolemia: Secondary | ICD-10-CM | POA: Diagnosis not present

## 2021-10-21 DIAGNOSIS — I10 Essential (primary) hypertension: Secondary | ICD-10-CM | POA: Diagnosis not present

## 2021-10-21 DIAGNOSIS — E1165 Type 2 diabetes mellitus with hyperglycemia: Secondary | ICD-10-CM | POA: Diagnosis not present

## 2021-10-21 DIAGNOSIS — Z6827 Body mass index (BMI) 27.0-27.9, adult: Secondary | ICD-10-CM | POA: Diagnosis not present

## 2021-10-21 DIAGNOSIS — Z23 Encounter for immunization: Secondary | ICD-10-CM | POA: Diagnosis not present

## 2021-11-09 DIAGNOSIS — I10 Essential (primary) hypertension: Secondary | ICD-10-CM | POA: Diagnosis not present

## 2021-11-09 DIAGNOSIS — E1165 Type 2 diabetes mellitus with hyperglycemia: Secondary | ICD-10-CM | POA: Diagnosis not present

## 2021-11-11 DIAGNOSIS — I10 Essential (primary) hypertension: Secondary | ICD-10-CM | POA: Diagnosis not present

## 2021-11-11 DIAGNOSIS — E875 Hyperkalemia: Secondary | ICD-10-CM | POA: Diagnosis not present

## 2021-11-23 DIAGNOSIS — E1140 Type 2 diabetes mellitus with diabetic neuropathy, unspecified: Secondary | ICD-10-CM | POA: Diagnosis not present

## 2021-11-23 DIAGNOSIS — L609 Nail disorder, unspecified: Secondary | ICD-10-CM | POA: Diagnosis not present

## 2021-11-24 DIAGNOSIS — E1169 Type 2 diabetes mellitus with other specified complication: Secondary | ICD-10-CM | POA: Diagnosis not present

## 2021-11-25 DIAGNOSIS — H3582 Retinal ischemia: Secondary | ICD-10-CM | POA: Diagnosis not present

## 2021-11-25 DIAGNOSIS — H31093 Other chorioretinal scars, bilateral: Secondary | ICD-10-CM | POA: Diagnosis not present

## 2021-11-25 DIAGNOSIS — E113313 Type 2 diabetes mellitus with moderate nonproliferative diabetic retinopathy with macular edema, bilateral: Secondary | ICD-10-CM | POA: Diagnosis not present

## 2021-11-25 DIAGNOSIS — H3562 Retinal hemorrhage, left eye: Secondary | ICD-10-CM | POA: Diagnosis not present

## 2021-12-02 DIAGNOSIS — Z6828 Body mass index (BMI) 28.0-28.9, adult: Secondary | ICD-10-CM | POA: Diagnosis not present

## 2021-12-02 DIAGNOSIS — I10 Essential (primary) hypertension: Secondary | ICD-10-CM | POA: Diagnosis not present

## 2021-12-02 DIAGNOSIS — E1165 Type 2 diabetes mellitus with hyperglycemia: Secondary | ICD-10-CM | POA: Diagnosis not present

## 2021-12-02 DIAGNOSIS — E7801 Familial hypercholesterolemia: Secondary | ICD-10-CM | POA: Diagnosis not present

## 2021-12-10 DIAGNOSIS — I10 Essential (primary) hypertension: Secondary | ICD-10-CM | POA: Diagnosis not present

## 2021-12-10 DIAGNOSIS — E1165 Type 2 diabetes mellitus with hyperglycemia: Secondary | ICD-10-CM | POA: Diagnosis not present

## 2021-12-13 DIAGNOSIS — G4733 Obstructive sleep apnea (adult) (pediatric): Secondary | ICD-10-CM | POA: Diagnosis not present

## 2022-01-05 DIAGNOSIS — Z23 Encounter for immunization: Secondary | ICD-10-CM | POA: Diagnosis not present

## 2022-01-07 DIAGNOSIS — E1165 Type 2 diabetes mellitus with hyperglycemia: Secondary | ICD-10-CM | POA: Diagnosis not present

## 2022-01-07 DIAGNOSIS — Z6828 Body mass index (BMI) 28.0-28.9, adult: Secondary | ICD-10-CM | POA: Diagnosis not present

## 2022-01-07 DIAGNOSIS — E7801 Familial hypercholesterolemia: Secondary | ICD-10-CM | POA: Diagnosis not present

## 2022-01-07 DIAGNOSIS — I10 Essential (primary) hypertension: Secondary | ICD-10-CM | POA: Diagnosis not present

## 2022-01-13 DIAGNOSIS — E113313 Type 2 diabetes mellitus with moderate nonproliferative diabetic retinopathy with macular edema, bilateral: Secondary | ICD-10-CM | POA: Diagnosis not present

## 2022-02-01 DIAGNOSIS — L609 Nail disorder, unspecified: Secondary | ICD-10-CM | POA: Diagnosis not present

## 2022-02-01 DIAGNOSIS — E1140 Type 2 diabetes mellitus with diabetic neuropathy, unspecified: Secondary | ICD-10-CM | POA: Diagnosis not present

## 2022-02-09 DIAGNOSIS — I10 Essential (primary) hypertension: Secondary | ICD-10-CM | POA: Diagnosis not present

## 2022-02-09 DIAGNOSIS — Z7984 Long term (current) use of oral hypoglycemic drugs: Secondary | ICD-10-CM | POA: Diagnosis not present

## 2022-02-09 DIAGNOSIS — E1165 Type 2 diabetes mellitus with hyperglycemia: Secondary | ICD-10-CM | POA: Diagnosis not present

## 2022-02-18 DIAGNOSIS — E1169 Type 2 diabetes mellitus with other specified complication: Secondary | ICD-10-CM | POA: Diagnosis not present

## 2022-02-18 DIAGNOSIS — E1165 Type 2 diabetes mellitus with hyperglycemia: Secondary | ICD-10-CM | POA: Diagnosis not present

## 2022-02-18 DIAGNOSIS — I10 Essential (primary) hypertension: Secondary | ICD-10-CM | POA: Diagnosis not present

## 2022-02-21 DIAGNOSIS — H35033 Hypertensive retinopathy, bilateral: Secondary | ICD-10-CM | POA: Diagnosis not present

## 2022-02-21 DIAGNOSIS — E113313 Type 2 diabetes mellitus with moderate nonproliferative diabetic retinopathy with macular edema, bilateral: Secondary | ICD-10-CM | POA: Diagnosis not present

## 2022-02-21 DIAGNOSIS — H3582 Retinal ischemia: Secondary | ICD-10-CM | POA: Diagnosis not present

## 2022-02-23 DIAGNOSIS — E7801 Familial hypercholesterolemia: Secondary | ICD-10-CM | POA: Diagnosis not present

## 2022-02-23 DIAGNOSIS — I10 Essential (primary) hypertension: Secondary | ICD-10-CM | POA: Diagnosis not present

## 2022-02-23 DIAGNOSIS — E1165 Type 2 diabetes mellitus with hyperglycemia: Secondary | ICD-10-CM | POA: Diagnosis not present

## 2022-02-23 DIAGNOSIS — Z6828 Body mass index (BMI) 28.0-28.9, adult: Secondary | ICD-10-CM | POA: Diagnosis not present

## 2022-03-11 DIAGNOSIS — I10 Essential (primary) hypertension: Secondary | ICD-10-CM | POA: Diagnosis not present

## 2022-03-11 DIAGNOSIS — E1165 Type 2 diabetes mellitus with hyperglycemia: Secondary | ICD-10-CM | POA: Diagnosis not present

## 2022-03-11 DIAGNOSIS — Z7984 Long term (current) use of oral hypoglycemic drugs: Secondary | ICD-10-CM | POA: Diagnosis not present

## 2022-03-11 DIAGNOSIS — M170 Bilateral primary osteoarthritis of knee: Secondary | ICD-10-CM | POA: Diagnosis not present

## 2022-03-23 DIAGNOSIS — D126 Benign neoplasm of colon, unspecified: Secondary | ICD-10-CM | POA: Insufficient documentation

## 2022-03-23 DIAGNOSIS — Z1211 Encounter for screening for malignant neoplasm of colon: Secondary | ICD-10-CM | POA: Diagnosis not present

## 2022-04-06 DIAGNOSIS — Z6828 Body mass index (BMI) 28.0-28.9, adult: Secondary | ICD-10-CM | POA: Diagnosis not present

## 2022-04-06 DIAGNOSIS — I10 Essential (primary) hypertension: Secondary | ICD-10-CM | POA: Diagnosis not present

## 2022-04-06 DIAGNOSIS — E1165 Type 2 diabetes mellitus with hyperglycemia: Secondary | ICD-10-CM | POA: Diagnosis not present

## 2022-04-06 DIAGNOSIS — E7801 Familial hypercholesterolemia: Secondary | ICD-10-CM | POA: Diagnosis not present

## 2022-04-11 DIAGNOSIS — M170 Bilateral primary osteoarthritis of knee: Secondary | ICD-10-CM | POA: Diagnosis not present

## 2022-04-11 DIAGNOSIS — Z7984 Long term (current) use of oral hypoglycemic drugs: Secondary | ICD-10-CM | POA: Diagnosis not present

## 2022-04-11 DIAGNOSIS — E1165 Type 2 diabetes mellitus with hyperglycemia: Secondary | ICD-10-CM | POA: Diagnosis not present

## 2022-04-11 DIAGNOSIS — I10 Essential (primary) hypertension: Secondary | ICD-10-CM | POA: Diagnosis not present

## 2022-04-12 DIAGNOSIS — E1140 Type 2 diabetes mellitus with diabetic neuropathy, unspecified: Secondary | ICD-10-CM | POA: Diagnosis not present

## 2022-04-12 DIAGNOSIS — L609 Nail disorder, unspecified: Secondary | ICD-10-CM | POA: Diagnosis not present

## 2022-04-14 DIAGNOSIS — E113313 Type 2 diabetes mellitus with moderate nonproliferative diabetic retinopathy with macular edema, bilateral: Secondary | ICD-10-CM | POA: Diagnosis not present

## 2022-05-17 DIAGNOSIS — E1165 Type 2 diabetes mellitus with hyperglycemia: Secondary | ICD-10-CM | POA: Diagnosis not present

## 2022-05-24 DIAGNOSIS — I10 Essential (primary) hypertension: Secondary | ICD-10-CM | POA: Diagnosis not present

## 2022-05-24 DIAGNOSIS — E7801 Familial hypercholesterolemia: Secondary | ICD-10-CM | POA: Diagnosis not present

## 2022-05-24 DIAGNOSIS — Z23 Encounter for immunization: Secondary | ICD-10-CM | POA: Diagnosis not present

## 2022-05-24 DIAGNOSIS — E1165 Type 2 diabetes mellitus with hyperglycemia: Secondary | ICD-10-CM | POA: Diagnosis not present

## 2022-05-24 DIAGNOSIS — Z6829 Body mass index (BMI) 29.0-29.9, adult: Secondary | ICD-10-CM | POA: Diagnosis not present

## 2022-06-02 DIAGNOSIS — I2510 Atherosclerotic heart disease of native coronary artery without angina pectoris: Secondary | ICD-10-CM | POA: Diagnosis not present

## 2022-06-02 DIAGNOSIS — K635 Polyp of colon: Secondary | ICD-10-CM | POA: Diagnosis not present

## 2022-06-02 DIAGNOSIS — Z79899 Other long term (current) drug therapy: Secondary | ICD-10-CM | POA: Diagnosis not present

## 2022-06-02 DIAGNOSIS — I10 Essential (primary) hypertension: Secondary | ICD-10-CM | POA: Diagnosis not present

## 2022-06-02 DIAGNOSIS — Z1211 Encounter for screening for malignant neoplasm of colon: Secondary | ICD-10-CM | POA: Diagnosis not present

## 2022-06-02 DIAGNOSIS — K5730 Diverticulosis of large intestine without perforation or abscess without bleeding: Secondary | ICD-10-CM | POA: Diagnosis not present

## 2022-06-02 DIAGNOSIS — Z7982 Long term (current) use of aspirin: Secondary | ICD-10-CM | POA: Diagnosis not present

## 2022-06-02 DIAGNOSIS — D122 Benign neoplasm of ascending colon: Secondary | ICD-10-CM | POA: Diagnosis not present

## 2022-06-02 DIAGNOSIS — E119 Type 2 diabetes mellitus without complications: Secondary | ICD-10-CM | POA: Diagnosis not present

## 2022-06-02 DIAGNOSIS — F17220 Nicotine dependence, chewing tobacco, uncomplicated: Secondary | ICD-10-CM | POA: Diagnosis not present

## 2022-06-02 DIAGNOSIS — D125 Benign neoplasm of sigmoid colon: Secondary | ICD-10-CM | POA: Diagnosis not present

## 2022-06-06 DIAGNOSIS — E113313 Type 2 diabetes mellitus with moderate nonproliferative diabetic retinopathy with macular edema, bilateral: Secondary | ICD-10-CM | POA: Diagnosis not present

## 2022-06-06 DIAGNOSIS — H35033 Hypertensive retinopathy, bilateral: Secondary | ICD-10-CM | POA: Diagnosis not present

## 2022-06-06 DIAGNOSIS — H3582 Retinal ischemia: Secondary | ICD-10-CM | POA: Diagnosis not present

## 2022-06-20 DIAGNOSIS — D126 Benign neoplasm of colon, unspecified: Secondary | ICD-10-CM | POA: Diagnosis not present

## 2022-06-20 DIAGNOSIS — K5790 Diverticulosis of intestine, part unspecified, without perforation or abscess without bleeding: Secondary | ICD-10-CM | POA: Diagnosis not present

## 2022-06-21 DIAGNOSIS — E1140 Type 2 diabetes mellitus with diabetic neuropathy, unspecified: Secondary | ICD-10-CM | POA: Diagnosis not present

## 2022-06-21 DIAGNOSIS — L609 Nail disorder, unspecified: Secondary | ICD-10-CM | POA: Diagnosis not present

## 2022-07-05 ENCOUNTER — Telehealth: Payer: Self-pay

## 2022-07-05 ENCOUNTER — Other Ambulatory Visit: Payer: Self-pay

## 2022-07-05 MED ORDER — APIXABAN 5 MG PO TABS: 5.0000 mg | ORAL_TABLET | Freq: Two times a day (BID) | ORAL | 5 refills | Status: DC

## 2022-07-05 NOTE — Telephone Encounter (Signed)
Prescription refill request for Eliquis received. Indication:Afib Last office visit:1/23 Scr:1.3 Age: 71 Weight:87 kg  Prescription refilled

## 2022-07-08 NOTE — Telephone Encounter (Signed)
completed

## 2022-07-18 DIAGNOSIS — Z23 Encounter for immunization: Secondary | ICD-10-CM | POA: Diagnosis not present

## 2022-07-18 DIAGNOSIS — I10 Essential (primary) hypertension: Secondary | ICD-10-CM | POA: Diagnosis not present

## 2022-07-18 DIAGNOSIS — Z6827 Body mass index (BMI) 27.0-27.9, adult: Secondary | ICD-10-CM | POA: Diagnosis not present

## 2022-07-18 DIAGNOSIS — E1165 Type 2 diabetes mellitus with hyperglycemia: Secondary | ICD-10-CM | POA: Diagnosis not present

## 2022-07-18 DIAGNOSIS — E7801 Familial hypercholesterolemia: Secondary | ICD-10-CM | POA: Diagnosis not present

## 2022-07-25 DIAGNOSIS — E113313 Type 2 diabetes mellitus with moderate nonproliferative diabetic retinopathy with macular edema, bilateral: Secondary | ICD-10-CM | POA: Diagnosis not present

## 2022-07-25 DIAGNOSIS — H35033 Hypertensive retinopathy, bilateral: Secondary | ICD-10-CM | POA: Diagnosis not present

## 2022-07-25 DIAGNOSIS — H31093 Other chorioretinal scars, bilateral: Secondary | ICD-10-CM | POA: Diagnosis not present

## 2022-07-25 DIAGNOSIS — H3582 Retinal ischemia: Secondary | ICD-10-CM | POA: Diagnosis not present

## 2022-08-30 DIAGNOSIS — E1140 Type 2 diabetes mellitus with diabetic neuropathy, unspecified: Secondary | ICD-10-CM | POA: Diagnosis not present

## 2022-08-30 DIAGNOSIS — L609 Nail disorder, unspecified: Secondary | ICD-10-CM | POA: Diagnosis not present

## 2022-09-08 DIAGNOSIS — H35033 Hypertensive retinopathy, bilateral: Secondary | ICD-10-CM | POA: Diagnosis not present

## 2022-09-08 DIAGNOSIS — H3582 Retinal ischemia: Secondary | ICD-10-CM | POA: Diagnosis not present

## 2022-09-08 DIAGNOSIS — E113312 Type 2 diabetes mellitus with moderate nonproliferative diabetic retinopathy with macular edema, left eye: Secondary | ICD-10-CM | POA: Diagnosis not present

## 2022-09-08 DIAGNOSIS — E113311 Type 2 diabetes mellitus with moderate nonproliferative diabetic retinopathy with macular edema, right eye: Secondary | ICD-10-CM | POA: Diagnosis not present

## 2022-09-22 DIAGNOSIS — L600 Ingrowing nail: Secondary | ICD-10-CM | POA: Diagnosis not present

## 2022-09-22 DIAGNOSIS — L03031 Cellulitis of right toe: Secondary | ICD-10-CM | POA: Diagnosis not present

## 2022-09-22 DIAGNOSIS — M79671 Pain in right foot: Secondary | ICD-10-CM | POA: Diagnosis not present

## 2022-09-22 DIAGNOSIS — M79674 Pain in right toe(s): Secondary | ICD-10-CM | POA: Diagnosis not present

## 2022-10-14 DIAGNOSIS — I482 Chronic atrial fibrillation, unspecified: Secondary | ICD-10-CM | POA: Diagnosis not present

## 2022-10-14 DIAGNOSIS — E559 Vitamin D deficiency, unspecified: Secondary | ICD-10-CM | POA: Diagnosis not present

## 2022-10-14 DIAGNOSIS — D519 Vitamin B12 deficiency anemia, unspecified: Secondary | ICD-10-CM | POA: Diagnosis not present

## 2022-10-14 DIAGNOSIS — I10 Essential (primary) hypertension: Secondary | ICD-10-CM | POA: Diagnosis not present

## 2022-10-14 DIAGNOSIS — E7800 Pure hypercholesterolemia, unspecified: Secondary | ICD-10-CM | POA: Diagnosis not present

## 2022-10-14 DIAGNOSIS — E039 Hypothyroidism, unspecified: Secondary | ICD-10-CM | POA: Diagnosis not present

## 2022-10-14 DIAGNOSIS — E1165 Type 2 diabetes mellitus with hyperglycemia: Secondary | ICD-10-CM | POA: Diagnosis not present

## 2022-10-17 DIAGNOSIS — L89893 Pressure ulcer of other site, stage 3: Secondary | ICD-10-CM | POA: Diagnosis not present

## 2022-10-17 DIAGNOSIS — M79671 Pain in right foot: Secondary | ICD-10-CM | POA: Diagnosis not present

## 2022-10-17 DIAGNOSIS — M79674 Pain in right toe(s): Secondary | ICD-10-CM | POA: Diagnosis not present

## 2022-10-17 DIAGNOSIS — E1140 Type 2 diabetes mellitus with diabetic neuropathy, unspecified: Secondary | ICD-10-CM | POA: Diagnosis not present

## 2022-10-20 DIAGNOSIS — Z6828 Body mass index (BMI) 28.0-28.9, adult: Secondary | ICD-10-CM | POA: Diagnosis not present

## 2022-10-20 DIAGNOSIS — I10 Essential (primary) hypertension: Secondary | ICD-10-CM | POA: Diagnosis not present

## 2022-10-20 DIAGNOSIS — E7801 Familial hypercholesterolemia: Secondary | ICD-10-CM | POA: Diagnosis not present

## 2022-10-20 DIAGNOSIS — E1165 Type 2 diabetes mellitus with hyperglycemia: Secondary | ICD-10-CM | POA: Diagnosis not present

## 2022-10-20 DIAGNOSIS — Z0001 Encounter for general adult medical examination with abnormal findings: Secondary | ICD-10-CM | POA: Diagnosis not present

## 2022-10-20 DIAGNOSIS — L0390 Cellulitis, unspecified: Secondary | ICD-10-CM | POA: Diagnosis not present

## 2022-10-23 NOTE — Progress Notes (Unsigned)
Cardiology Office Note   Date:  10/26/2022   ID:  [name], [name] 05-Sep-1951, MRN [id number]  PCP:  Thomas Hollow, MD  Cardiologist:   Thomas Majestic, MD   Chief Complaint  Patient presents with   Atrial Fibrillation      History of Present Illness: [name] is a 72 y.o. male who for follow up of CAD.   He was hospitalized for syncope in 2022.  He had a cath as below.  3 out of 5 grafts were patent.    The sequential vein to the ramus branch and obtuse marginal branch was occluded at its origin.  Sequential vein to the PDA and PLA was widely patent as was the LIMA to the LAD.  He was managed medically.  Cath demonstrated an EF of 50%.  There was apical hypokinesis and mild LVH.  He had moderately elevated pulmonary pressures.  There was mild MR.   He wore a monitor and the atrial fib has a slow heart rate in the 30s.     Since I last saw him he has had no new cardiovascular complaints.  Thinks his shortness of breath is better than it was.  He does not notice his atrial fibrillation and he is actually in sinus today.  This is unusual because he thought this was chronic fibrillation.  He does have some issues with balance and dizziness but he says this is positional when he looks a certain way and he keeps himself from falling by using a cane.  He has had some work he said have been on the toenail and apparently has a nonhealing wound on his foot that is being addressed by podiatry.  He does have numbness in his feet.  His blood sugars have been elevated and he is going to see an endocrinologist.  He is not having any new chest pressure, neck or arm discomfort.  Is not having any new shortness of breath, PND or orthopnea.  He said no weight gain or edema.   Past Medical History:  Diagnosis Date   A-fib ([place])    CAD (coronary artery disease)    Hypertension    Obstructive sleep apnea    CPAP   Rosacea    Type 2 diabetes mellitus with hyperglycemia (HCC)     Past  Surgical History:  Procedure Laterality Date   CORONARY ARTERY BYPASS GRAFT     LIMA to the LAD, sequential SVG to ramus intermediate and OM1, sequential SVG to PDA and posterior lateral   RIGHT/LEFT HEART CATH AND CORONARY/GRAFT ANGIOGRAPHY N/A 01/21/2021   Procedure: RIGHT/LEFT HEART CATH AND CORONARY/GRAFT ANGIOGRAPHY;  Surgeon: Lorretta Harp, MD;  Location: [place] [facility];  Service: Cardiovascular;  Laterality: N/A;     Current Outpatient Medications  Medication Sig Dispense Refill   amLODipine (NORVASC) 10 MG tablet Take 10 mg by mouth daily.     apixaban (ELIQUIS) 5 MG TABS tablet Take 1 tablet (5 mg total) by mouth 2 (two) times daily. 60 tablet 5   Dulaglutide (TRULICITY) 3 0000000 SOPN Inject 3 mg into the skin once a week.     ergocalciferol (VITAMIN D2) 1.25 MG (50000 UT) capsule Take 50,000 Units by mouth once a week.     glipiZIDE (GLUCOTROL) 10 MG tablet Take 10 mg by mouth daily before breakfast.     hydrochlorothiazide (HYDRODIURIL) 12.5 MG tablet Take 12.5 mg by mouth every other day.     metFORMIN (  GLUCOPHAGE-XR) 500 MG 24 hr tablet Take 1,000 mg by mouth 2 (two) times daily.     rosuvastatin (CRESTOR) 10 MG tablet Take 10 mg by mouth See admin instructions. Takes 10 mg on Mondays and Fridays     valsartan (DIOVAN) 320 MG tablet Take 320 mg by mouth daily.     No current facility-administered medications for this visit.    Allergies:   Patient has no known allergies.    ROS:  Please see the history of present illness.   Otherwise, review of systems are positive for none.   All other systems are reviewed and negative.    PHYSICAL EXAM: VS:  BP 116/60   Pulse 73   Ht 5' 9"$  (1.753 m)   Wt 189 lb (85.7 kg)   BMI 27.91 kg/m  , BMI Body mass index is 27.91 kg/m. GENERAL:  Well appearing NECK:  No jugular venous distention, waveform within normal limits, carotid upstroke brisk and symmetric, no bruits, no thyromegaly LUNGS:  Clear to auscultation  bilaterally CHEST:  Unremarkable HEART:  PMI not displaced or sustained,S1 and S2 within normal limits, no S3, no S4, no clicks, no rubs, no murmurs ABD:  Flat, positive bowel sounds normal in frequency in pitch, no bruits, no rebound, no guarding, no midline pulsatile mass, no hepatomegaly, no splenomegaly EXT:  2 plus pulses upper and decreased dorsalis pedis and posterior tibialis bilateral, no edema, no cyanosis no clubbing    EKG:  EKG is  ordered today.  sinus rhythm, rate 73, axis within normal limits, intervals within normal limits, no acute ST-T wave changes.  CARDIAC CATH:   Dominance: Right      Recent Labs: No results found for requested labs within last 365 days.    Lipid Panel    Component Value Date/Time   CHOL 106 01/20/2021 0026   TRIG 70 01/20/2021 0026   HDL 24 (L) 01/20/2021 0026   CHOLHDL 4.4 01/20/2021 0026   VLDL 14 01/20/2021 0026   LDLCALC 68 01/20/2021 0026      Wt Readings from Last 3 Encounters:  10/26/22 189 lb (85.7 kg)  09/15/21 191 lb 12.8 oz (87 kg)  04/01/21 185 lb (83.9 kg)      Other studies Reviewed: Additional studies/ records that were reviewed today include: [place] Review of the above records demonstrates:  Please see elsewhere in the note.     ASSESSMENT AND PLAN:  CAD:   The patient has no new sypmtoms.  No further cardiovascular testing is indicated.  We will continue with aggressive risk reduction and meds as listed.  ATRIAL FIB:   He tolerates anticoagulation.   [name] has a CHA2DS2 - VASc score of 5.   Interestingly he is in sinus rhythm.  I would not discontinue the anticoagulation though.  He tolerates this.  HTN: The blood pressure seems to be well-controlled.  He will continue the meds as listed.  DYSLIPIDEMIA: Total cholesterol was 143 with an HDL of 32.  Continue current therapy.   DM: A1c was up to 9.0 and he has been referred to endocrinology.    PULMONARY HTN:  The PA pressure was 34 on  right heart cath.  I will continue follow this clinically.   FOOT PAIN: I will check ABIs.  Current medicines are reviewed at length with the patient today.  The patient does not have concerns regarding medicines.  The following changes have been made:  None  Labs/ tests ordered today include:  Orders Placed This Encounter  Procedures   US ARTERIAL LOWER EXTREMITY DUPLEX BILATERAL   EKG 12-Lead     Disposition:   FU with me in 12 months     Signed, Minus Breeding, MD  10/26/2022 12:00 PM    [place]

## 2022-10-25 DIAGNOSIS — L89892 Pressure ulcer of other site, stage 2: Secondary | ICD-10-CM | POA: Diagnosis not present

## 2022-10-25 DIAGNOSIS — M79671 Pain in right foot: Secondary | ICD-10-CM | POA: Diagnosis not present

## 2022-10-25 DIAGNOSIS — E1140 Type 2 diabetes mellitus with diabetic neuropathy, unspecified: Secondary | ICD-10-CM | POA: Diagnosis not present

## 2022-10-25 DIAGNOSIS — M79674 Pain in right toe(s): Secondary | ICD-10-CM | POA: Diagnosis not present

## 2022-10-26 ENCOUNTER — Encounter: Payer: Self-pay | Admitting: Cardiology

## 2022-10-26 ENCOUNTER — Ambulatory Visit (INDEPENDENT_AMBULATORY_CARE_PROVIDER_SITE_OTHER): Payer: Medicare Other | Admitting: Cardiology

## 2022-10-26 VITALS — BP 116/60 | HR 73 | Ht 69.0 in | Wt 189.0 lb

## 2022-10-26 DIAGNOSIS — M79606 Pain in leg, unspecified: Secondary | ICD-10-CM | POA: Diagnosis not present

## 2022-10-26 DIAGNOSIS — E118 Type 2 diabetes mellitus with unspecified complications: Secondary | ICD-10-CM | POA: Diagnosis not present

## 2022-10-26 DIAGNOSIS — I272 Pulmonary hypertension, unspecified: Secondary | ICD-10-CM | POA: Diagnosis not present

## 2022-10-26 DIAGNOSIS — I10 Essential (primary) hypertension: Secondary | ICD-10-CM

## 2022-10-26 DIAGNOSIS — I2510 Atherosclerotic heart disease of native coronary artery without angina pectoris: Secondary | ICD-10-CM

## 2022-10-26 DIAGNOSIS — E785 Hyperlipidemia, unspecified: Secondary | ICD-10-CM | POA: Diagnosis not present

## 2022-10-26 DIAGNOSIS — I482 Chronic atrial fibrillation, unspecified: Secondary | ICD-10-CM

## 2022-10-26 NOTE — Patient Instructions (Signed)
Medication Instructions:  The current medical regimen is effective;  continue present plan and medications.  *If you need a refill on your cardiac medications before your next appointment, please call your pharmacy*  Testing/Procedures: Your physician has requested that you have a lower extremity arterial exercise duplex. During this test, exercise and ultrasound are used to evaluate arterial blood flow in the legs. Allow one hour for this exam. There are no restrictions or special instructions. This testing will be completed at [facility].  Please check in at the main entrance.  You will be contacted to be scheduled.   Follow-Up: At [facility], you and your health needs are our priority.  As part of our continuing mission to provide you with exceptional heart care, we have created designated Provider Care Teams.  These Care Teams include your primary Cardiologist (physician) and Advanced Practice Providers (APPs -  Physician Assistants and Nurse Practitioners) who all work together to provide you with the care you need, when you need it.  We recommend signing up for the patient portal called "MyChart".  Sign up information is provided on this After Visit Summary.  MyChart is used to connect with patients for Virtual Visits (Telemedicine).  Patients are able to view lab/test results, encounter notes, upcoming appointments, etc.  Non-urgent messages can be sent to your provider as well.   To learn more about what you can do with MyChart, go to [email or web address].    Your next appointment:   1 year(s)  Provider:   Minus Breeding, MD

## 2022-10-27 DIAGNOSIS — R030 Elevated blood-pressure reading, without diagnosis of hypertension: Secondary | ICD-10-CM | POA: Diagnosis not present

## 2022-10-27 DIAGNOSIS — L0390 Cellulitis, unspecified: Secondary | ICD-10-CM | POA: Diagnosis not present

## 2022-10-27 DIAGNOSIS — Z6827 Body mass index (BMI) 27.0-27.9, adult: Secondary | ICD-10-CM | POA: Diagnosis not present

## 2022-10-27 DIAGNOSIS — I2510 Atherosclerotic heart disease of native coronary artery without angina pectoris: Secondary | ICD-10-CM | POA: Diagnosis not present

## 2022-11-01 ENCOUNTER — Ambulatory Visit (HOSPITAL_COMMUNITY): Admission: RE | Admit: 2022-11-01 | Payer: Medicare Other | Source: Ambulatory Visit

## 2022-11-03 DIAGNOSIS — H35033 Hypertensive retinopathy, bilateral: Secondary | ICD-10-CM | POA: Diagnosis not present

## 2022-11-03 DIAGNOSIS — E113313 Type 2 diabetes mellitus with moderate nonproliferative diabetic retinopathy with macular edema, bilateral: Secondary | ICD-10-CM | POA: Diagnosis not present

## 2022-11-03 DIAGNOSIS — H43823 Vitreomacular adhesion, bilateral: Secondary | ICD-10-CM | POA: Diagnosis not present

## 2022-11-03 DIAGNOSIS — H3582 Retinal ischemia: Secondary | ICD-10-CM | POA: Diagnosis not present

## 2022-11-04 DIAGNOSIS — Z6828 Body mass index (BMI) 28.0-28.9, adult: Secondary | ICD-10-CM | POA: Diagnosis not present

## 2022-11-04 DIAGNOSIS — I2510 Atherosclerotic heart disease of native coronary artery without angina pectoris: Secondary | ICD-10-CM | POA: Diagnosis not present

## 2022-11-04 DIAGNOSIS — E1165 Type 2 diabetes mellitus with hyperglycemia: Secondary | ICD-10-CM | POA: Diagnosis not present

## 2022-11-04 DIAGNOSIS — E7801 Familial hypercholesterolemia: Secondary | ICD-10-CM | POA: Diagnosis not present

## 2022-11-04 DIAGNOSIS — I10 Essential (primary) hypertension: Secondary | ICD-10-CM | POA: Diagnosis not present

## 2022-11-09 ENCOUNTER — Ambulatory Visit (HOSPITAL_COMMUNITY)
Admission: RE | Admit: 2022-11-09 | Discharge: 2022-11-09 | Disposition: A | Discharge status: Home / Self Care | Payer: Medicare Other | Source: Ambulatory Visit | Attending: Cardiology | Admitting: Cardiology

## 2022-11-09 DIAGNOSIS — M79604 Pain in right leg: Secondary | ICD-10-CM | POA: Diagnosis not present

## 2022-11-09 DIAGNOSIS — M79606 Pain in leg, unspecified: Secondary | ICD-10-CM | POA: Insufficient documentation

## 2022-11-10 DIAGNOSIS — L600 Ingrowing nail: Secondary | ICD-10-CM | POA: Diagnosis not present

## 2022-11-10 DIAGNOSIS — M79675 Pain in left toe(s): Secondary | ICD-10-CM | POA: Diagnosis not present

## 2022-11-10 DIAGNOSIS — M79671 Pain in right foot: Secondary | ICD-10-CM | POA: Diagnosis not present

## 2022-11-10 DIAGNOSIS — L89892 Pressure ulcer of other site, stage 2: Secondary | ICD-10-CM | POA: Diagnosis not present

## 2022-11-10 DIAGNOSIS — I739 Peripheral vascular disease, unspecified: Secondary | ICD-10-CM | POA: Diagnosis not present

## 2022-11-10 DIAGNOSIS — M79672 Pain in left foot: Secondary | ICD-10-CM | POA: Diagnosis not present

## 2022-11-10 DIAGNOSIS — M79674 Pain in right toe(s): Secondary | ICD-10-CM | POA: Diagnosis not present

## 2022-11-11 ENCOUNTER — Encounter: Payer: Self-pay | Admitting: Podiatry

## 2022-11-11 ENCOUNTER — Ambulatory Visit (INDEPENDENT_AMBULATORY_CARE_PROVIDER_SITE_OTHER): Payer: Medicare Other | Admitting: Podiatry

## 2022-11-11 ENCOUNTER — Ambulatory Visit (INDEPENDENT_AMBULATORY_CARE_PROVIDER_SITE_OTHER): Payer: Medicare Other

## 2022-11-11 DIAGNOSIS — S91301A Unspecified open wound, right foot, initial encounter: Secondary | ICD-10-CM | POA: Diagnosis not present

## 2022-11-11 DIAGNOSIS — E08621 Diabetes mellitus due to underlying condition with foot ulcer: Secondary | ICD-10-CM | POA: Diagnosis not present

## 2022-11-11 DIAGNOSIS — L600 Ingrowing nail: Secondary | ICD-10-CM

## 2022-11-11 DIAGNOSIS — L97519 Non-pressure chronic ulcer of other part of right foot with unspecified severity: Secondary | ICD-10-CM

## 2022-11-11 MED ORDER — DOXYCYCLINE HYCLATE 100 MG PO TABS: 100.0000 mg | ORAL_TABLET | Freq: Two times a day (BID) | ORAL | 0 refills | Status: AC

## 2022-11-11 NOTE — Progress Notes (Signed)
  Subjective:  Patient ID: [name], male    DOB: 03/23/1951,   MRN: [id number]  Chief Complaint  Patient presents with   Foot Ulcer    Right foot second toe ulcer     72 y.o. male presents for concern of right second toe wound and ingrown right great toe referred from [facility]. Relates it started as an ingrown nail that he had removed from another podiatrist about a month ago. After the procedure he developed an area on the second toe possible from the bandaging of the great toe. Has been keeping silvadene on the areas and has been on and off soaking. He recently saw his cardiologist and was sent for ABIs. Cardiologist  Denies any other pedal complaints. Denies n/v/f/c.   Past Medical History:  Diagnosis Date   A-fib ([place])    CAD (coronary artery disease)    Hypertension    Obstructive sleep apnea    CPAP   Rosacea    Type 2 diabetes mellitus with hyperglycemia (HCC)     Objective:  Physical Exam: Vascular: DP/PT pulses 2/4 bilateral. CFT <3 seconds. Normal hair growth on digits. No edema.  Skin. No lacerations or abrasions bilateral feet. Right second digit dorsal wound with fibrosis and eschar overlying with surrounding erythema. No purulence noted. No probe to bone noted. Erythema extending to MPJ of the second digit. Right great toe lateral border nail incurvated with mild drainage noted and some mild erythema.  Musculoskeletal: MMT 5/5 bilateral lower extremities in DF, PF, Inversion and Eversion. Deceased ROM in DF of ankle joint.  Neurological: Sensation intact to light touch.   ABIs:  IMPRESSION: Nondiagnostic ankle brachial indices due to noncompressible arteries at the level of the ankle. Lower extremity arterial duplex, CTA runoff, or direct lower extremity angiography could be considered for further characterization as clinically indicated.    Assessment:   1. Diabetic ulcer of toe of right foot associated with diabetes mellitus due to underlying  condition, unspecified ulcer stage ([place])   2. Ingrown right greater toenail      Plan:  Patient was evaluated and treated and all questions answered. Ulcer right second digit with unstageable ulcer with cellulitis  -No debridement today as concern for blood flow  -X-rays reviewed No obvious osseous erosions noted.  -Dressed with betadine, DSD. -Off-loading with surgical shoe. -Doxycycline sent for 10 days -ABIs reviewed and abnormal. Cardiology suggested follow-up with Dr. Gwenlyn Found. Agree and will send in referral as well.  -Discussed glucose control and proper protein-rich diet.  -Discussed if any worsening redness, pain, fever or chills to call or may need to report to the emergency room. Patient expressed understanding.    Return in about 2 weeks (around 11/25/2022) for wound check.   Lorenda Peck, DPM

## 2022-11-21 ENCOUNTER — Encounter: Payer: Self-pay | Admitting: Podiatry

## 2022-11-21 ENCOUNTER — Ambulatory Visit (INDEPENDENT_AMBULATORY_CARE_PROVIDER_SITE_OTHER): Payer: Medicare Other | Admitting: Podiatry

## 2022-11-21 DIAGNOSIS — E08621 Diabetes mellitus due to underlying condition with foot ulcer: Secondary | ICD-10-CM

## 2022-11-21 DIAGNOSIS — L600 Ingrowing nail: Secondary | ICD-10-CM

## 2022-11-21 DIAGNOSIS — L97519 Non-pressure chronic ulcer of other part of right foot with unspecified severity: Secondary | ICD-10-CM | POA: Diagnosis not present

## 2022-11-21 NOTE — Progress Notes (Signed)
  Subjective:  Patient ID: [name], male    DOB: 1951/05/17,   MRN: [id number]  Chief Complaint  Patient presents with   Diabetic Ulcer    72 y.o. male presents for follow-up of right second toe wound and ingrown right great toe. He has appointment tomorrow with cardiologist to review ABIs and need for possible intervention.He has been keeping betadine on the toe and has been taking antibiotics and relates it does appear improved.   Denies any other pedal complaints. Denies n/v/f/c.   Past Medical History:  Diagnosis Date   A-fib ([place])    CAD (coronary artery disease)    Hypertension    Obstructive sleep apnea    CPAP   Rosacea    Type 2 diabetes mellitus with hyperglycemia (HCC)     Objective:  Physical Exam: Vascular: DP/PT pulses 2/4 bilateral. CFT <3 seconds. Normal hair growth on digits. No edema.  Skin. No lacerations or abrasions bilateral feet. Right second digit dorsal wound with fibrosis and eschar overlying with surrounding erythema. No purulence noted. No probe to bone noted. Minimal erythema surrounding toe and much improved from prior. Right great toe lateral border nail incurvated with mild drainage noted and some mild erythema also improved.   Musculoskeletal: MMT 5/5 bilateral lower extremities in DF, PF, Inversion and Eversion. Deceased ROM in DF of ankle joint.  Neurological: Sensation intact to light touch.   ABIs:  IMPRESSION: Nondiagnostic ankle brachial indices due to noncompressible arteries at the level of the ankle. Lower extremity arterial duplex, CTA runoff, or direct lower extremity angiography could be considered for further characterization as clinically indicated.    Assessment:   1. Diabetic ulcer of toe of right foot associated with diabetes mellitus due to underlying condition, unspecified ulcer stage ([place])   2. Ingrown right greater toenail       Plan:  Patient was evaluated and treated and all questions answered. Ulcer right  second digit with unstageable ulcer with cellulitis  -No debridement today as concern for blood flow  -X-rays reviewed No obvious osseous erosions noted.  -Dressed with betadine, DSD. -Off-loading with surgical shoe. -Finish out antibiotics course.  -ABIs reviewed and abnormal. Has appointment with Dr. Fletcher Anon tomorrow.   -Discussed glucose control and proper protein-rich diet.  -Discussed if any worsening redness, pain, fever or chills to call or may need to report to the emergency room. Patient expressed understanding.  Follow-up 2 weeks for possible debridement and ingrown nail procedure.    No follow-ups on file.   Lorenda Peck, DPM

## 2022-11-22 ENCOUNTER — Encounter: Payer: Self-pay | Admitting: Cardiovascular Disease

## 2022-11-22 ENCOUNTER — Ambulatory Visit: Payer: Medicare Other | Attending: Cardiovascular Disease | Admitting: Cardiovascular Disease

## 2022-11-22 VITALS — BP 122/58 | HR 80 | Ht 69.0 in | Wt 190.0 lb

## 2022-11-22 DIAGNOSIS — I739 Peripheral vascular disease, unspecified: Secondary | ICD-10-CM

## 2022-11-22 DIAGNOSIS — I2510 Atherosclerotic heart disease of native coronary artery without angina pectoris: Secondary | ICD-10-CM | POA: Diagnosis not present

## 2022-11-22 DIAGNOSIS — I10 Essential (primary) hypertension: Secondary | ICD-10-CM | POA: Diagnosis not present

## 2022-11-22 DIAGNOSIS — I480 Paroxysmal atrial fibrillation: Secondary | ICD-10-CM

## 2022-11-22 DIAGNOSIS — E785 Hyperlipidemia, unspecified: Secondary | ICD-10-CM | POA: Diagnosis not present

## 2022-11-22 MED ORDER — ASPIRIN 81 MG PO TBEC: 81.0000 mg | DELAYED_RELEASE_TABLET | Freq: Every day | ORAL | 3 refills | Status: DC

## 2022-11-22 NOTE — H&P (View-Only) (Signed)
  Cardiology Office Note   Date:  11/22/2022   ID:  [name], DOB 06/22/1951, MRN [id number]  PCP:  Thompson, Aaron B, MD  Cardiologist: Dr. Hochrein  Chief Complaint  Patient presents with   Follow-up      History of Present Illness: [name] is a 72 y.o. male who was referred for evaluation of possible peripheral arterial disease. He has known history of coronary artery disease status post CABG, chronic systolic heart failure with an EF of 50% due to ischemic cardiomyopathy, paroxysmal atrial fibrillation, essential hypertension, type 2 diabetes, mild chronic kidney disease and hyperlipidemia.  The patient had his right toenails clipped about 8 weeks ago and also has an ingrown first toenail.  He subsequently developed an ulceration on the right second toe that has worsened over the last few weeks.  It is now a full wound with dark discoloration.  He reports bilateral leg fatigue after walking a short distance although he does not experience pain.  He has to stop and rest before he can resume.  He is currently being followed by Dr. Sikora in podiatry. Recent ABI was done and showed noncompressible vessels.  He was noted to have monophasic waveforms at the ankle.  No chest pain or shortness of breath.  Past Medical History:  Diagnosis Date   A-fib (HCC)    CAD (coronary artery disease)    Hypertension    Obstructive sleep apnea    CPAP   Rosacea    Type 2 diabetes mellitus with hyperglycemia (HCC)     Past Surgical History:  Procedure Laterality Date   CORONARY ARTERY BYPASS GRAFT     LIMA to the LAD, sequential SVG to ramus intermediate and OM1, sequential SVG to PDA and posterior lateral   RIGHT/LEFT HEART CATH AND CORONARY/GRAFT ANGIOGRAPHY N/A 01/21/2021   Procedure: RIGHT/LEFT HEART CATH AND CORONARY/GRAFT ANGIOGRAPHY;  Surgeon: Berry, Jonathan J, MD;  Location: [facility];  Service: Cardiovascular;  Laterality: N/A;     Current Outpatient  Medications  Medication Sig Dispense Refill   amLODipine (NORVASC) 10 MG tablet Take 10 mg by mouth daily.     apixaban (ELIQUIS) 5 MG TABS tablet Take 1 tablet (5 mg total) by mouth 2 (two) times daily. 60 tablet 5   Dulaglutide (TRULICITY) 3 MG/0.5ML SOPN Inject 3 mg into the skin once a week.     ergocalciferol (VITAMIN D2) 1.25 MG (50000 UT) capsule Take 50,000 Units by mouth once a week.     glipiZIDE (GLUCOTROL) 10 MG tablet Take 10 mg by mouth daily before breakfast.     hydrochlorothiazide (HYDRODIURIL) 12.5 MG tablet Take 12.5 mg by mouth every other day.     metFORMIN (GLUCOPHAGE-XR) 500 MG 24 hr tablet Take 1,000 mg by mouth 2 (two) times daily.     rosuvastatin (CRESTOR) 10 MG tablet Take 10 mg by mouth See admin instructions. Takes 10 mg on Mondays and Fridays     valsartan (DIOVAN) 320 MG tablet Take 320 mg by mouth daily.     No current facility-administered medications for this visit.    Allergies:   Patient has no known allergies.    Social History:  The patient  reports that he has never smoked. His smokeless tobacco use includes snuff. He reports current alcohol use. He reports that he does not use drugs.   Family History:  The patient's family history includes Brain cancer in his mother; Breast cancer in his mother; CAD (age   of onset: 50) in his brother; Cancer - Other in his father; Diabetes in his brother and mother; Fibromyalgia in his sister; Heart Problems in his sister; Heart attack in his mother; Heart attack (age of onset: 58) in his father; Heart disease in his father; Heart disease (age of onset: 55) in his mother; Kidney cancer in his father; Migraines in his sister.    ROS:  Please see the history of present illness.   Otherwise, review of systems are positive for none.   All other systems are reviewed and negative.    PHYSICAL EXAM: VS:  BP (!) 122/58 (BP Location: Left Arm, Patient Position: Sitting, Cuff Size: Normal)   Pulse 80   Ht 5' 9" (1.753 m)    Wt 190 lb (86.2 kg)   BMI 28.06 kg/m  , BMI Body mass index is 28.06 kg/m. GEN: Well nourished, well developed, in no acute distress  HEENT: normal  Neck: no JVD, carotid bruits, or masses Cardiac: RRR; no murmurs, rubs, or gallops,no edema  Respiratory:  clear to auscultation bilaterally, normal work of breathing GI: soft, nontender, nondistended, + BS MS: no deformity or atrophy  Skin: warm and dry, no rash Neuro:  Strength and sensation are intact Psych: euthymic mood, full affect Vascular: Femoral pulses +1 bilaterally.  Posterior tibial is not palpable bilaterally.  Dorsalis pedis is not palpable on the right and +1 on the left. There is a wound involving the right second toe with dark discoloration.   EKG:  EKG is not ordered today.   Recent Labs: No results found for requested labs within last 365 days.    Lipid Panel    Component Value Date/Time   CHOL 106 01/20/2021 0026   TRIG 70 01/20/2021 0026   HDL 24 (L) 01/20/2021 0026   CHOLHDL 4.4 01/20/2021 0026   VLDL 14 01/20/2021 0026   LDLCALC 68 01/20/2021 0026      Wt Readings from Last 3 Encounters:  11/22/22 190 lb (86.2 kg)  10/26/22 189 lb (85.7 kg)  09/15/21 191 lb 12.8 oz (87 kg)          03/01/2017    1:52 PM  PAD Screen  Previous PAD dx? No  Previous surgical procedure? No  Pain with walking? No  Feet/toe relief with dangling? No  Painful, non-healing ulcers? No  Extremities discolored? No      ASSESSMENT AND PLAN:  1.  Peripheral arterial disease with nonhealing ulceration involving the right second toe with early gangrenous changes: This is a limb threatening situation.  By physical exam, he has no palpable distal pulses on the right and recent ABI was noncompressible with monophasic waveforms at the ankle.  I suspect significant underlying peripheral arterial disease which is the likely culprit for poor healing so far. I recommend proceeding with abdominal aortogram with lower extremity  angiography and possible endovascular intervention.  I discussed the procedure in details as well as risk and benefits. I asked him to hold Eliquis 2 days before the procedure.  Start aspirin 81 mg once daily now. Planned access is via the left common femoral artery.  2.  Coronary artery disease involving native coronary arteries status post remote CABG: Currently with no anginal symptoms.  3.  Paroxysmal atrial fibrillation: He seems to be in sinus rhythm.  He is on anticoagulation with Eliquis.  This will be held 2 days before the angiogram.  4.  Essential hypertension: Blood pressure is well-controlled.  5.  Hyperlipidemia: Continue treatment with   rosuvastatin with a target LDL of less than 70.    Disposition:   Proceed with angiography next week.  FU in 1 month  Signed,  Quintin Hjort, MD  11/22/2022 9:05 AM    [facility] 

## 2022-11-22 NOTE — Patient Instructions (Addendum)
Medication Instructions:  START Aspirin 81 mg daily  *If you need a refill on your cardiac medications before your next appointment, please call your pharmacy*   Testing/Procedures: Your physician has requested that you have a peripheral vascular angiogram. This exam is performed at the hospital. During this exam IV contrast is used to look at arterial blood flow. Please review the information sheet given for details.    Follow-Up: At [facility], you and your health needs are our priority.  As part of our continuing mission to provide you with exceptional heart care, we have created designated Provider Care Teams.  These Care Teams include your primary Cardiologist (physician) and Advanced Practice Providers (APPs -  Physician Assistants and Nurse Practitioners) who all work together to provide you with the care you need, when you need it.  We recommend signing up for the patient portal called "MyChart".  Sign up information is provided on this After Visit Summary.  MyChart is used to connect with patients for Virtual Visits (Telemedicine).  Patients are able to view lab/test results, encounter notes, upcoming appointments, etc.  Non-urgent messages can be sent to your provider as well.   To learn more about what you can do with MyChart, go to [email or web address].    Your next appointment:   Keep your post procedure follow up on 4/23 at 3:35.  Other Instructions  [facility] DE[facility] [id number] [place] [address] Dept: [phone] Loc: [place]  11/22/2022  You are scheduled for a Peripheral Angiogram on Wednesday, March 20 with Dr. Kathlyn Sacramento.  1. Please arrive at the [facility] (Main Entrance A) at [facility]: [address] [place], [place] [address] at 10:30 AM (This time is two hours before your procedure to  ensure your preparation). Free valet parking service is available.   Special note: Every effort is made to have your procedure done on time. Please understand that emergencies sometimes delay scheduled procedures.  2. Diet: Do not eat solid foods after midnight.  The patient may have clear liquids until 5am upon the day of the procedure.  3. Labs: You will need to have blood drawn on 3/12. You do not need to be fasting.  4. Medication instructions in preparation for your procedure: Hold all diabetic medications the morning of the procedure Hold the Metformin the morning of the procedure and then 48 hours after Hold the Hydrochlorothiazide the morning of the procedure Hold the Eliquis two days prior to the procedure and the morning of (hold 3/18 and 3/19)   On the morning of your procedure, take your Aspirin 81 mg and any morning medicines NOT listed above.  You may use sips of water.  5. Plan for one night stay--bring personal belongings. 6. Bring a current list of your medications and current insurance cards. 7. You MUST have a responsible person to drive you home. 8. Someone MUST be with you the first 24 hours after you arrive home or your discharge will be delayed. 9. Please wear clothes that are easy to get on and off and wear slip-on shoes.  Thank you for allowing [place] to care for you!   -- [facility]

## 2022-11-22 NOTE — Progress Notes (Signed)
Cardiology Office Note   Date:  11/22/2022   ID:  [name] 1951/01/03, MRN [id number]  PCP:  Bonnita Hollow, MD  Cardiologist: Dr. Percival Spanish  Chief Complaint  Patient presents with   Follow-up      History of Present Illness: [name] is a 72 y.o. male who was referred for evaluation of possible peripheral arterial disease. He has known history of coronary artery disease status post CABG, chronic systolic heart failure with an EF of 50% due to ischemic cardiomyopathy, paroxysmal atrial fibrillation, essential hypertension, type 2 diabetes, mild chronic kidney disease and hyperlipidemia.  The patient had his right toenails clipped about 8 weeks ago and also has an ingrown first toenail.  He subsequently developed an ulceration on the right second toe that has worsened over the last few weeks.  It is now a full wound with dark discoloration.  He reports bilateral leg fatigue after walking a short distance although he does not experience pain.  He has to stop and rest before he can resume.  He is currently being followed by Dr. Blenda Mounts in podiatry. Recent ABI was done and showed noncompressible vessels.  He was noted to have monophasic waveforms at the ankle.  No chest pain or shortness of breath.  Past Medical History:  Diagnosis Date   A-fib ([place])    CAD (coronary artery disease)    Hypertension    Obstructive sleep apnea    CPAP   Rosacea    Type 2 diabetes mellitus with hyperglycemia (HCC)     Past Surgical History:  Procedure Laterality Date   CORONARY ARTERY BYPASS GRAFT     LIMA to the LAD, sequential SVG to ramus intermediate and OM1, sequential SVG to PDA and posterior lateral   RIGHT/LEFT HEART CATH AND CORONARY/GRAFT ANGIOGRAPHY N/A 01/21/2021   Procedure: RIGHT/LEFT HEART CATH AND CORONARY/GRAFT ANGIOGRAPHY;  Surgeon: Lorretta Harp, MD;  Location: [facility];  Service: Cardiovascular;  Laterality: N/A;     Current Outpatient  Medications  Medication Sig Dispense Refill   amLODipine (NORVASC) 10 MG tablet Take 10 mg by mouth daily.     apixaban (ELIQUIS) 5 MG TABS tablet Take 1 tablet (5 mg total) by mouth 2 (two) times daily. 60 tablet 5   Dulaglutide (TRULICITY) 3 0000000 SOPN Inject 3 mg into the skin once a week.     ergocalciferol (VITAMIN D2) 1.25 MG (50000 UT) capsule Take 50,000 Units by mouth once a week.     glipiZIDE (GLUCOTROL) 10 MG tablet Take 10 mg by mouth daily before breakfast.     hydrochlorothiazide (HYDRODIURIL) 12.5 MG tablet Take 12.5 mg by mouth every other day.     metFORMIN (GLUCOPHAGE-XR) 500 MG 24 hr tablet Take 1,000 mg by mouth 2 (two) times daily.     rosuvastatin (CRESTOR) 10 MG tablet Take 10 mg by mouth See admin instructions. Takes 10 mg on Mondays and Fridays     valsartan (DIOVAN) 320 MG tablet Take 320 mg by mouth daily.     No current facility-administered medications for this visit.    Allergies:   Patient has no known allergies.    Social History:  The patient  reports that he has never smoked. His smokeless tobacco use includes snuff. He reports current alcohol use. He reports that he does not use drugs.   Family History:  The patient's family history includes Brain cancer in his mother; Breast cancer in his mother; CAD (age  of onset: 46) in his brother; Cancer - Other in his father; Diabetes in his brother and mother; Fibromyalgia in his sister; Heart Problems in his sister; Heart attack in his mother; Heart attack (age of onset: 33) in his father; Heart disease in his father; Heart disease (age of onset: 20) in his mother; Kidney cancer in his father; Migraines in his sister.    ROS:  Please see the history of present illness.   Otherwise, review of systems are positive for none.   All other systems are reviewed and negative.    PHYSICAL EXAM: VS:  BP (!) 122/58 (BP Location: Left Arm, Patient Position: Sitting, Cuff Size: Normal)   Pulse 80   Ht '5\' 9"'$  (1.753 m)    Wt 190 lb (86.2 kg)   BMI 28.06 kg/m  , BMI Body mass index is 28.06 kg/m. GEN: Well nourished, well developed, in no acute distress  HEENT: normal  Neck: no JVD, carotid bruits, or masses Cardiac: RRR; no murmurs, rubs, or gallops,no edema  Respiratory:  clear to auscultation bilaterally, normal work of breathing GI: soft, nontender, nondistended, + BS MS: no deformity or atrophy  Skin: warm and dry, no rash Neuro:  Strength and sensation are intact Psych: euthymic mood, full affect Vascular: Femoral pulses +1 bilaterally.  Posterior tibial is not palpable bilaterally.  Dorsalis pedis is not palpable on the right and +1 on the left. There is a wound involving the right second toe with dark discoloration.   EKG:  EKG is not ordered today.   Recent Labs: No results found for requested labs within last 365 days.    Lipid Panel    Component Value Date/Time   CHOL 106 01/20/2021 0026   TRIG 70 01/20/2021 0026   HDL 24 (L) 01/20/2021 0026   CHOLHDL 4.4 01/20/2021 0026   VLDL 14 01/20/2021 0026   LDLCALC 68 01/20/2021 0026      Wt Readings from Last 3 Encounters:  11/22/22 190 lb (86.2 kg)  10/26/22 189 lb (85.7 kg)  09/15/21 191 lb 12.8 oz (87 kg)          03/01/2017    1:52 PM  PAD Screen  Previous PAD dx? No  Previous surgical procedure? No  Pain with walking? No  Feet/toe relief with dangling? No  Painful, non-healing ulcers? No  Extremities discolored? No      ASSESSMENT AND PLAN:  1.  Peripheral arterial disease with nonhealing ulceration involving the right second toe with early gangrenous changes: This is a limb threatening situation.  By physical exam, he has no palpable distal pulses on the right and recent ABI was noncompressible with monophasic waveforms at the ankle.  I suspect significant underlying peripheral arterial disease which is the likely culprit for poor healing so far. I recommend proceeding with abdominal aortogram with lower extremity  angiography and possible endovascular intervention.  I discussed the procedure in details as well as risk and benefits. I asked him to hold Eliquis 2 days before the procedure.  Start aspirin 81 mg once daily now. Planned access is via the left common femoral artery.  2.  Coronary artery disease involving native coronary arteries status post remote CABG: Currently with no anginal symptoms.  3.  Paroxysmal atrial fibrillation: He seems to be in sinus rhythm.  He is on anticoagulation with Eliquis.  This will be held 2 days before the angiogram.  4.  Essential hypertension: Blood pressure is well-controlled.  5.  Hyperlipidemia: Continue treatment with  rosuvastatin with a target LDL of less than 70.    Disposition:   Proceed with angiography next week.  FU in 1 month  Signed,  Kathlyn Sacramento, MD  11/22/2022 9:05 AM    [place]

## 2022-11-23 LAB — BASIC METABOLIC PANEL
BUN/Creatinine Ratio: 11 (ref 10–24)
BUN: 15 mg/dL (ref 8–27)
CO2: 23 mmol/L (ref 20–29)
Calcium: 10.6 mg/dL — ABNORMAL HIGH (ref 8.6–10.2)
Chloride: 99 mmol/L (ref 96–106)
Creatinine, Ser: 1.31 mg/dL — ABNORMAL HIGH (ref 0.76–1.27)
Glucose: 278 mg/dL — ABNORMAL HIGH (ref 70–99)
Potassium: 4.8 mmol/L (ref 3.5–5.2)
Sodium: 137 mmol/L (ref 134–144)
eGFR: 58 mL/min/{1.73_m2} — ABNORMAL LOW (ref >59)

## 2022-11-23 LAB — CBC
Hematocrit: 42.2 % (ref 37.5–51.0)
Hemoglobin: 13.7 g/dL (ref 13.0–17.7)
MCH: 28.8 pg (ref 26.6–33.0)
MCHC: 32.5 g/dL (ref 31.5–35.7)
MCV: 89 fL (ref 79–97)
Platelets: 221 10*3/uL (ref 150–450)
RBC: 4.75 x10E6/uL (ref 4.14–5.80)
RDW: 13.6 % (ref 11.6–15.4)
WBC: 5.6 10*3/uL (ref 3.4–10.8)

## 2022-11-28 ENCOUNTER — Ambulatory Visit: Payer: Medicare Other | Admitting: Podiatry

## 2022-11-29 ENCOUNTER — Telehealth: Payer: Self-pay | Admitting: *Deleted

## 2022-11-29 NOTE — Telephone Encounter (Signed)
Abdominal aortogram scheduled at [facility] for: Wednesday November 30, 2022 12:30 PM Arrival time [facility] at: 10:30 AM  Nothing to eat after midnight prior to procedure, clear liquids until 5 AM day of procedure.  Medication instructions: -Hold:  Eliquis-none 11/28/22 until post procedure  HCTZ/Valsartan-day before and day of procedure -per protocol GFR 58  Metformin-day of procedure and 48 hours post procedure  Actos/Glipizide-AM of procedure -Other usual morning medications can be taken with sips of water including aspirin 81 mg.  Confirmed patient has responsible adult to drive home post procedure and be with patient first 24 hours after arriving home.  Plan to go home the same day, you will only stay overnight if medically necessary.  Reviewed procedure instructions with patient's wife, Arrie Aran ([place]).

## 2022-11-30 ENCOUNTER — Encounter (HOSPITAL_COMMUNITY)
Admission: AD | Disposition: A | Discharge status: Home / Self Care | Payer: Self-pay | Source: Home / Self Care | Attending: Cardiology

## 2022-11-30 ENCOUNTER — Inpatient Hospital Stay (HOSPITAL_COMMUNITY)
Admission: AD | Admit: 2022-11-30 | Discharge: 2022-12-02 | DRG: 291 | Disposition: A | Discharge status: Home / Self Care | Payer: Medicare Other | Attending: Cardiology | Admitting: Cardiology

## 2022-11-30 ENCOUNTER — Ambulatory Visit (HOSPITAL_COMMUNITY): Payer: Medicare Other

## 2022-11-30 ENCOUNTER — Other Ambulatory Visit: Payer: Self-pay

## 2022-11-30 ENCOUNTER — Other Ambulatory Visit: Payer: Self-pay | Admitting: *Deleted

## 2022-11-30 DIAGNOSIS — R0602 Shortness of breath: Secondary | ICD-10-CM

## 2022-11-30 DIAGNOSIS — Z8249 Family history of ischemic heart disease and other diseases of the circulatory system: Secondary | ICD-10-CM

## 2022-11-30 DIAGNOSIS — Z951 Presence of aortocoronary bypass graft: Secondary | ICD-10-CM

## 2022-11-30 DIAGNOSIS — I5043 Acute on chronic combined systolic (congestive) and diastolic (congestive) heart failure: Secondary | ICD-10-CM | POA: Diagnosis present

## 2022-11-30 DIAGNOSIS — E1165 Type 2 diabetes mellitus with hyperglycemia: Secondary | ICD-10-CM | POA: Diagnosis present

## 2022-11-30 DIAGNOSIS — N182 Chronic kidney disease, stage 2 (mild): Secondary | ICD-10-CM | POA: Insufficient documentation

## 2022-11-30 DIAGNOSIS — J811 Chronic pulmonary edema: Secondary | ICD-10-CM | POA: Diagnosis present

## 2022-11-30 DIAGNOSIS — Z7984 Long term (current) use of oral hypoglycemic drugs: Secondary | ICD-10-CM

## 2022-11-30 DIAGNOSIS — I350 Nonrheumatic aortic (valve) stenosis: Secondary | ICD-10-CM

## 2022-11-30 DIAGNOSIS — G4733 Obstructive sleep apnea (adult) (pediatric): Secondary | ICD-10-CM | POA: Diagnosis present

## 2022-11-30 DIAGNOSIS — I130 Hypertensive heart and chronic kidney disease with heart failure and stage 1 through stage 4 chronic kidney disease, or unspecified chronic kidney disease: Principal | ICD-10-CM | POA: Diagnosis present

## 2022-11-30 DIAGNOSIS — I5031 Acute diastolic (congestive) heart failure: Secondary | ICD-10-CM | POA: Diagnosis not present

## 2022-11-30 DIAGNOSIS — E876 Hypokalemia: Secondary | ICD-10-CM | POA: Diagnosis present

## 2022-11-30 DIAGNOSIS — I5033 Acute on chronic diastolic (congestive) heart failure: Secondary | ICD-10-CM | POA: Diagnosis not present

## 2022-11-30 DIAGNOSIS — I484 Atypical atrial flutter: Secondary | ICD-10-CM | POA: Diagnosis not present

## 2022-11-30 DIAGNOSIS — E871 Hypo-osmolality and hyponatremia: Secondary | ICD-10-CM | POA: Diagnosis not present

## 2022-11-30 DIAGNOSIS — E1152 Type 2 diabetes mellitus with diabetic peripheral angiopathy with gangrene: Secondary | ICD-10-CM | POA: Diagnosis present

## 2022-11-30 DIAGNOSIS — I482 Chronic atrial fibrillation, unspecified: Secondary | ICD-10-CM | POA: Diagnosis not present

## 2022-11-30 DIAGNOSIS — Z79899 Other long term (current) drug therapy: Secondary | ICD-10-CM

## 2022-11-30 DIAGNOSIS — Z720 Tobacco use: Secondary | ICD-10-CM | POA: Diagnosis not present

## 2022-11-30 DIAGNOSIS — I10 Essential (primary) hypertension: Secondary | ICD-10-CM | POA: Diagnosis not present

## 2022-11-30 DIAGNOSIS — Z7985 Long-term (current) use of injectable non-insulin antidiabetic drugs: Secondary | ICD-10-CM | POA: Diagnosis not present

## 2022-11-30 DIAGNOSIS — I701 Atherosclerosis of renal artery: Secondary | ICD-10-CM | POA: Diagnosis not present

## 2022-11-30 DIAGNOSIS — L97519 Non-pressure chronic ulcer of other part of right foot with unspecified severity: Secondary | ICD-10-CM | POA: Diagnosis not present

## 2022-11-30 DIAGNOSIS — E1122 Type 2 diabetes mellitus with diabetic chronic kidney disease: Secondary | ICD-10-CM | POA: Diagnosis present

## 2022-11-30 DIAGNOSIS — Z9861 Coronary angioplasty status: Secondary | ICD-10-CM

## 2022-11-30 DIAGNOSIS — E119 Type 2 diabetes mellitus without complications: Secondary | ICD-10-CM | POA: Diagnosis not present

## 2022-11-30 DIAGNOSIS — I739 Peripheral vascular disease, unspecified: Secondary | ICD-10-CM | POA: Diagnosis not present

## 2022-11-30 DIAGNOSIS — Z833 Family history of diabetes mellitus: Secondary | ICD-10-CM

## 2022-11-30 DIAGNOSIS — Z7901 Long term (current) use of anticoagulants: Secondary | ICD-10-CM

## 2022-11-30 DIAGNOSIS — R0902 Hypoxemia: Secondary | ICD-10-CM | POA: Diagnosis not present

## 2022-11-30 DIAGNOSIS — I4892 Unspecified atrial flutter: Secondary | ICD-10-CM | POA: Insufficient documentation

## 2022-11-30 DIAGNOSIS — I480 Paroxysmal atrial fibrillation: Secondary | ICD-10-CM | POA: Diagnosis not present

## 2022-11-30 DIAGNOSIS — E785 Hyperlipidemia, unspecified: Secondary | ICD-10-CM | POA: Diagnosis present

## 2022-11-30 DIAGNOSIS — J9601 Acute respiratory failure with hypoxia: Secondary | ICD-10-CM | POA: Diagnosis not present

## 2022-11-30 DIAGNOSIS — E11621 Type 2 diabetes mellitus with foot ulcer: Secondary | ICD-10-CM | POA: Diagnosis present

## 2022-11-30 DIAGNOSIS — I2510 Atherosclerotic heart disease of native coronary artery without angina pectoris: Secondary | ICD-10-CM | POA: Diagnosis not present

## 2022-11-30 DIAGNOSIS — I255 Ischemic cardiomyopathy: Secondary | ICD-10-CM | POA: Diagnosis present

## 2022-11-30 DIAGNOSIS — I340 Nonrheumatic mitral (valve) insufficiency: Secondary | ICD-10-CM | POA: Insufficient documentation

## 2022-11-30 HISTORY — PX: ABDOMINAL AORTOGRAM W/LOWER EXTREMITY: CATH118223

## 2022-11-30 LAB — ECHOCARDIOGRAM COMPLETE
AR max vel: 2.04 cm2
AV Area VTI: 1.8 cm2
AV Area mean vel: 1.87 cm2
AV Mean grad: 7 mmHg
AV Peak grad: 12 mmHg
Ao pk vel: 1.73 m/s
Area-P 1/2: 4.53 cm2
Calc EF: 34.4 %
Height: 69 in
S' Lateral: 3.5 cm
Single Plane A2C EF: 39.3 %
Single Plane A4C EF: 26.2 %
Weight: 3008 oz

## 2022-11-30 LAB — GLUCOSE, CAPILLARY
Glucose-Capillary: 142 mg/dL — ABNORMAL HIGH (ref 70–99)
Glucose-Capillary: 172 mg/dL — ABNORMAL HIGH (ref 70–99)
Glucose-Capillary: 370 mg/dL — ABNORMAL HIGH (ref 70–99)
Glucose-Capillary: 370 mg/dL — ABNORMAL HIGH (ref 70–99)

## 2022-11-30 SURGERY — ABDOMINAL AORTOGRAM W/LOWER EXTREMITY
Anesthesia: LOCAL | Laterality: Right

## 2022-11-30 MED ORDER — SODIUM CHLORIDE 0.9% FLUSH: 3.0000 mL | INTRAVENOUS | Status: DC | PRN

## 2022-11-30 MED ORDER — IODIXANOL 320 MG/ML IV SOLN
INTRAVENOUS | Status: DC | PRN
  Administered 2022-11-30: 50 mL

## 2022-11-30 MED ORDER — SODIUM CHLORIDE 0.9 % WEIGHT BASED INFUSION
3.0000 mL/kg/h | INTRAVENOUS | Status: AC
  Administered 2022-11-30: 3 mL/kg/h via INTRAVENOUS

## 2022-11-30 MED ORDER — SODIUM CHLORIDE 0.9 % IV SOLN: 250.0000 mL | INTRAVENOUS | Status: DC | PRN

## 2022-11-30 MED ORDER — SODIUM CHLORIDE 0.9% FLUSH
3.0000 mL | Freq: Two times a day (BID) | INTRAVENOUS | Status: DC
  Administered 2022-11-30 – 2022-12-02 (×4): 3 mL via INTRAVENOUS

## 2022-11-30 MED ORDER — MIDAZOLAM HCL 2 MG/2ML IJ SOLN
INTRAMUSCULAR | Status: AC
  Filled 2022-11-30: qty 2

## 2022-11-30 MED ORDER — HEPARIN (PORCINE) 25000 UT/250ML-% IV SOLN
1350.0000 [IU]/h | INTRAVENOUS | Status: DC
  Administered 2022-11-30: 1250 [IU]/h via INTRAVENOUS
  Filled 2022-11-30: qty 250

## 2022-11-30 MED ORDER — FUROSEMIDE 10 MG/ML IJ SOLN
60.0000 mg | Freq: Once | INTRAMUSCULAR | Status: AC
  Administered 2022-11-30: 60 mg via INTRAVENOUS

## 2022-11-30 MED ORDER — MIDAZOLAM HCL 2 MG/2ML IJ SOLN
INTRAMUSCULAR | Status: DC | PRN
  Administered 2022-11-30: 1 mg via INTRAVENOUS

## 2022-11-30 MED ORDER — FUROSEMIDE 10 MG/ML IJ SOLN
INTRAMUSCULAR | Status: AC
  Filled 2022-11-30: qty 4

## 2022-11-30 MED ORDER — SODIUM CHLORIDE 0.9 % WEIGHT BASED INFUSION: 1.0000 mL/kg/h | INTRAVENOUS | Status: DC

## 2022-11-30 MED ORDER — FENTANYL CITRATE (PF) 100 MCG/2ML IJ SOLN
INTRAMUSCULAR | Status: AC
  Filled 2022-11-30: qty 2

## 2022-11-30 MED ORDER — INSULIN ASPART 100 UNIT/ML IJ SOLN
0.0000 [IU] | Freq: Three times a day (TID) | INTRAMUSCULAR | Status: DC
  Administered 2022-12-01: 8 [IU] via SUBCUTANEOUS
  Administered 2022-12-01: 5 [IU] via SUBCUTANEOUS
  Administered 2022-12-01: 3 [IU] via SUBCUTANEOUS
  Administered 2022-12-02: 8 [IU] via SUBCUTANEOUS

## 2022-11-30 MED ORDER — FUROSEMIDE 10 MG/ML IJ SOLN
INTRAMUSCULAR | Status: AC
  Filled 2022-11-30: qty 2

## 2022-11-30 MED ORDER — SODIUM CHLORIDE 0.9% FLUSH: 3.0000 mL | Freq: Two times a day (BID) | INTRAVENOUS | Status: DC

## 2022-11-30 MED ORDER — FENTANYL CITRATE (PF) 100 MCG/2ML IJ SOLN
INTRAMUSCULAR | Status: DC | PRN
  Administered 2022-11-30: 25 ug via INTRAVENOUS

## 2022-11-30 MED ORDER — ACETAMINOPHEN 325 MG PO TABS: 650.0000 mg | ORAL_TABLET | ORAL | Status: DC | PRN

## 2022-11-30 MED ORDER — LIDOCAINE HCL (PF) 1 % IJ SOLN
INTRAMUSCULAR | Status: DC | PRN
  Administered 2022-11-30: 10 mL

## 2022-11-30 MED ORDER — LIDOCAINE HCL (PF) 1 % IJ SOLN
INTRAMUSCULAR | Status: AC
  Filled 2022-11-30: qty 30

## 2022-11-30 MED ORDER — ONDANSETRON HCL 4 MG/2ML IJ SOLN: 4.0000 mg | Freq: Four times a day (QID) | INTRAMUSCULAR | Status: DC | PRN

## 2022-11-30 MED ORDER — ASPIRIN 81 MG PO CHEW: 81.0000 mg | CHEWABLE_TABLET | ORAL | Status: DC

## 2022-11-30 MED ORDER — HEPARIN (PORCINE) IN NACL 1000-0.9 UT/500ML-% IV SOLN
INTRAVENOUS | Status: DC | PRN
  Administered 2022-11-30 (×2): 500 mL

## 2022-11-30 SURGICAL SUPPLY — 14 items
BAG SNAP BAND KOVER 36X36 (MISCELLANEOUS) IMPLANT
CATH ANGIO 5F PIGTAIL 65CM (CATHETERS) IMPLANT
CATH CROSS OVER TEMPO 5F (CATHETERS) IMPLANT
CATH STRAIGHT 5FR 65CM (CATHETERS) IMPLANT
DEVICE CLOSURE MYNXGRIP 5F (Vascular Products) IMPLANT
KIT MICROPUNCTURE NIT STIFF (SHEATH) IMPLANT
KIT PV (KITS) ×1 IMPLANT
SHEATH PINNACLE 5F 10CM (SHEATH) IMPLANT
STOPCOCK MORSE 400PSI 3WAY (MISCELLANEOUS) IMPLANT
SYR MEDRAD MARK 7 150ML (SYRINGE) ×1 IMPLANT
TRANSDUCER W/STOPCOCK (MISCELLANEOUS) ×1 IMPLANT
TRAY PV CATH (CUSTOM PROCEDURE TRAY) ×1 IMPLANT
TUBING CIL FLEX 10 FLL-RA (TUBING) IMPLANT
WIRE BENTSON .035X145CM (WIRE) IMPLANT

## 2022-11-30 NOTE — Plan of Care (Signed)

## 2022-11-30 NOTE — Progress Notes (Addendum)
Patient began coughing uncontrollably, sounding congested , o2 sat 84 percent. IVF were turned down to 10 ml/ hr via pump, Anderson Malta to see patient. Per orders - patient was given lasix 60 mg IV, patient was placed on 4 liters O2 by PA.

## 2022-11-30 NOTE — Interval H&P Note (Signed)
History and Physical Interval Note:  11/30/2022 1:25 PM  [name]  has presented today for surgery, with the diagnosis of grangren.  The various methods of treatment have been discussed with the patient and family. After consideration of risks, benefits and other options for treatment, the patient has consented to  Procedure(s): ABDOMINAL AORTOGRAM W/LOWER EXTREMITY (N/A) as a surgical intervention.  The patient's history has been reviewed, patient examined, no change in status, stable for surgery.  I have reviewed the patient's chart and labs.  Questions were answered to the patient's satisfaction.     Kathlyn Sacramento

## 2022-11-30 NOTE — Progress Notes (Signed)
Called cath lab, Freida Busman EMT/RT, Dr Fletcher Anon in a case, explained Dr needs to see pt before DC, hr 104,in and out of afib,  bp 142/81, pt was on 3 L/[place] trying to ween pt since he does not wear O2, off O2 his spo2 is 84%, denies pain,  Dr Fletcher Anon will see before pt leaves.

## 2022-11-30 NOTE — Progress Notes (Signed)
    Back to assess patient. Attempts to wean from [place] have been unsuccessful as he drops his sats in the upper 80s. Rhythm now appears irregular on telemetry, suspect he is in afib (hx of the same), rates in the 90-100 currently. Blood pressures stable.  -- will admit overnight for observation -- hold on additional IV lasix for now, has diuresed well thus far -- EKG obtained showing rate controlled afib. Given recent PV procedure with femoral access, will not resume Eliquis this evening. Will order for IV heparin this evening with plans to transition back to Eliquis tomorrow -- continue attempts to wean O2  Signed, Reino Bellis, NP-C 11/30/2022, 5:06 PM Pager: [phone]

## 2022-11-30 NOTE — Progress Notes (Signed)
    Called to the bedside with patient having increased shortness of breath. Received 250cc bolus NS then developed increased work of breath.   On assessment patient with course crackles, O2 sat 84% on RA, BP 146/70, mild respiratory distress noted.   Will give IV lasix 60mg  x1, IVFs stopped. Placed on [place] @4L .   Given he developed pulmonary edema with small amount of IVF, will order echo to reassess LVEF to ensure no decline.   Updated interventional MD of patient status, hopefully will be able to continue with scheduled PV procedure if improvement in respiratory status.   Barnet Pall, NP-C 11/30/2022, 11:58 AM Pager: [phone]

## 2022-11-30 NOTE — Progress Notes (Signed)
ANTICOAGULATION CONSULT NOTE - Initial Consult  Pharmacy Consult for heparin Indication: atrial fibrillation  No Known Allergies  Patient Measurements: Height: 5\' 9"  (175.3 cm) Weight: 85.3 kg (188 lb) IBW/kg (Calculated) : 70.7 Heparin Dosing Weight: 85kg  Vital Signs: Temp: 98.5 F (36.9 C) (03/20 1738) Temp Source: Oral (03/20 1738) BP: 144/76 (03/20 1738) Pulse Rate: 100 (03/20 1738)  Labs: No results for input(s): "HGB", "HCT", "PLT", "APTT", "LABPROT", "INR", "HEPARINUNFRC", "HEPRLOWMOCWT", "CREATININE", "CKTOTAL", "CKMB", "TROPONINIHS" in the last 72 hours.  Estimated Creatinine Clearance: 56 mL/min (A) (by C-G formula based on SCr of 1.31 mg/dL (H)).   Medical History: Past Medical History:  Diagnosis Date   A-fib ([place])    CAD (coronary artery disease)    Hypertension    Obstructive sleep apnea    CPAP   Rosacea    Type 2 diabetes mellitus with hyperglycemia (HCC)     Medications:  Medications Prior to Admission  Medication Sig Dispense Refill Last Dose   amLODipine (NORVASC) 10 MG tablet Take 10 mg by mouth daily.   11/29/2022   apixaban (ELIQUIS) 5 MG TABS tablet Take 1 tablet (5 mg total) by mouth 2 (two) times daily. 60 tablet 5 11/27/2022   aspirin EC 81 MG tablet Take 1 tablet (81 mg total) by mouth daily. Swallow whole. 90 tablet 3 11/30/2022 at 0930   Dulaglutide (TRULICITY) 3 0000000 SOPN Inject 3 mg into the skin every Saturday.   Past Week   ergocalciferol (VITAMIN D2) 1.25 MG (50000 UT) capsule Take 50,000 Units by mouth every Saturday.   Past Week   glipiZIDE (GLUCOTROL) 10 MG tablet Take 10 mg by mouth 2 (two) times daily before a meal.   11/29/2022   hydrochlorothiazide (HYDRODIURIL) 12.5 MG tablet Take 12.5 mg by mouth every other day.   11/29/2022   metFORMIN (GLUCOPHAGE-XR) 500 MG 24 hr tablet Take 500 mg by mouth 2 (two) times daily with a meal.   11/28/2022   pioglitazone (ACTOS) 15 MG tablet Take 15 mg by mouth daily.   11/29/2022    rosuvastatin (CRESTOR) 10 MG tablet Take 10 mg by mouth See admin instructions. Takes 10 mg on Mondays and Fridays   11/29/2022   valsartan (DIOVAN) 320 MG tablet Take 320 mg by mouth daily.   11/29/2022   triamcinolone cream (KENALOG) 0.1 % Apply 1 Application topically 2 (two) times daily as needed (dry skin).   More than a month   Scheduled:   furosemide       furosemide       [START ON 12/01/2022] insulin aspart  0-15 Units Subcutaneous TID WC   sodium chloride flush  3 mL Intravenous Q12H   Infusions:   sodium chloride     heparin      Assessment: Pt has been on apixaban for his AF prior to admission. Last dose was 3/17 based on medrec. He had aortogram today. Plan to bridge with heparin 6h post sheath removal. Likely transition back to apixaban in AM.  Scr 1.31 on 3/12  Goal of Therapy:  Heparin level 0.3-0.7 units/ml aPTT 66-102 seconds Monitor platelets by anticoagulation protocol: Yes   Plan:  Heparin 1250 units/hr - start at 8pm Check 8 hr HL/PTT then daily F/u resume apixaban in AM  Onnie Boer, PharmD, BCIDP, AAHIVP, CPP Infectious Disease Pharmacist 11/30/2022 5:46 PM

## 2022-12-01 ENCOUNTER — Observation Stay (HOSPITAL_COMMUNITY): Payer: Medicare Other

## 2022-12-01 ENCOUNTER — Encounter (HOSPITAL_COMMUNITY): Payer: Self-pay | Admitting: Cardiovascular Disease

## 2022-12-01 DIAGNOSIS — I5033 Acute on chronic diastolic (congestive) heart failure: Secondary | ICD-10-CM | POA: Diagnosis not present

## 2022-12-01 DIAGNOSIS — R0902 Hypoxemia: Secondary | ICD-10-CM | POA: Diagnosis not present

## 2022-12-01 DIAGNOSIS — G4733 Obstructive sleep apnea (adult) (pediatric): Secondary | ICD-10-CM | POA: Diagnosis present

## 2022-12-01 DIAGNOSIS — I10 Essential (primary) hypertension: Secondary | ICD-10-CM | POA: Diagnosis not present

## 2022-12-01 DIAGNOSIS — N182 Chronic kidney disease, stage 2 (mild): Secondary | ICD-10-CM | POA: Diagnosis present

## 2022-12-01 DIAGNOSIS — I5031 Acute diastolic (congestive) heart failure: Secondary | ICD-10-CM | POA: Diagnosis not present

## 2022-12-01 DIAGNOSIS — I739 Peripheral vascular disease, unspecified: Secondary | ICD-10-CM | POA: Insufficient documentation

## 2022-12-01 DIAGNOSIS — Z7984 Long term (current) use of oral hypoglycemic drugs: Secondary | ICD-10-CM | POA: Diagnosis not present

## 2022-12-01 DIAGNOSIS — I480 Paroxysmal atrial fibrillation: Secondary | ICD-10-CM | POA: Insufficient documentation

## 2022-12-01 DIAGNOSIS — E1165 Type 2 diabetes mellitus with hyperglycemia: Secondary | ICD-10-CM | POA: Diagnosis not present

## 2022-12-01 DIAGNOSIS — L97519 Non-pressure chronic ulcer of other part of right foot with unspecified severity: Secondary | ICD-10-CM | POA: Diagnosis present

## 2022-12-01 DIAGNOSIS — Z7901 Long term (current) use of anticoagulants: Secondary | ICD-10-CM | POA: Diagnosis not present

## 2022-12-01 DIAGNOSIS — I482 Chronic atrial fibrillation, unspecified: Secondary | ICD-10-CM | POA: Diagnosis present

## 2022-12-01 DIAGNOSIS — J811 Chronic pulmonary edema: Secondary | ICD-10-CM | POA: Diagnosis present

## 2022-12-01 DIAGNOSIS — E119 Type 2 diabetes mellitus without complications: Secondary | ICD-10-CM | POA: Diagnosis not present

## 2022-12-01 DIAGNOSIS — I701 Atherosclerosis of renal artery: Secondary | ICD-10-CM | POA: Diagnosis present

## 2022-12-01 DIAGNOSIS — I350 Nonrheumatic aortic (valve) stenosis: Secondary | ICD-10-CM

## 2022-12-01 DIAGNOSIS — Z8249 Family history of ischemic heart disease and other diseases of the circulatory system: Secondary | ICD-10-CM | POA: Diagnosis not present

## 2022-12-01 DIAGNOSIS — E1122 Type 2 diabetes mellitus with diabetic chronic kidney disease: Secondary | ICD-10-CM | POA: Diagnosis present

## 2022-12-01 DIAGNOSIS — I255 Ischemic cardiomyopathy: Secondary | ICD-10-CM | POA: Diagnosis present

## 2022-12-01 DIAGNOSIS — E1152 Type 2 diabetes mellitus with diabetic peripheral angiopathy with gangrene: Secondary | ICD-10-CM | POA: Diagnosis present

## 2022-12-01 DIAGNOSIS — I2510 Atherosclerotic heart disease of native coronary artery without angina pectoris: Secondary | ICD-10-CM | POA: Diagnosis present

## 2022-12-01 DIAGNOSIS — E11621 Type 2 diabetes mellitus with foot ulcer: Secondary | ICD-10-CM | POA: Diagnosis present

## 2022-12-01 DIAGNOSIS — I5043 Acute on chronic combined systolic (congestive) and diastolic (congestive) heart failure: Secondary | ICD-10-CM | POA: Diagnosis present

## 2022-12-01 DIAGNOSIS — I130 Hypertensive heart and chronic kidney disease with heart failure and stage 1 through stage 4 chronic kidney disease, or unspecified chronic kidney disease: Secondary | ICD-10-CM | POA: Diagnosis present

## 2022-12-01 DIAGNOSIS — Z7985 Long-term (current) use of injectable non-insulin antidiabetic drugs: Secondary | ICD-10-CM | POA: Diagnosis not present

## 2022-12-01 DIAGNOSIS — I4892 Unspecified atrial flutter: Secondary | ICD-10-CM | POA: Insufficient documentation

## 2022-12-01 DIAGNOSIS — J9601 Acute respiratory failure with hypoxia: Secondary | ICD-10-CM | POA: Diagnosis not present

## 2022-12-01 DIAGNOSIS — E871 Hypo-osmolality and hyponatremia: Secondary | ICD-10-CM | POA: Insufficient documentation

## 2022-12-01 DIAGNOSIS — E876 Hypokalemia: Secondary | ICD-10-CM | POA: Diagnosis present

## 2022-12-01 DIAGNOSIS — Z951 Presence of aortocoronary bypass graft: Secondary | ICD-10-CM | POA: Diagnosis not present

## 2022-12-01 DIAGNOSIS — E785 Hyperlipidemia, unspecified: Secondary | ICD-10-CM | POA: Diagnosis present

## 2022-12-01 DIAGNOSIS — I340 Nonrheumatic mitral (valve) insufficiency: Secondary | ICD-10-CM | POA: Insufficient documentation

## 2022-12-01 DIAGNOSIS — I484 Atypical atrial flutter: Secondary | ICD-10-CM | POA: Diagnosis not present

## 2022-12-01 DIAGNOSIS — Z720 Tobacco use: Secondary | ICD-10-CM | POA: Diagnosis not present

## 2022-12-01 LAB — CBC
HCT: 37.5 % — ABNORMAL LOW (ref 39.0–52.0)
Hemoglobin: 12.3 g/dL — ABNORMAL LOW (ref 13.0–17.0)
MCH: 28.8 pg (ref 26.0–34.0)
MCHC: 32.8 g/dL (ref 30.0–36.0)
MCV: 87.8 fL (ref 80.0–100.0)
Platelets: 196 10*3/uL (ref 150–400)
RBC: 4.27 MIL/uL (ref 4.22–5.81)
RDW: 14 % (ref 11.5–15.5)
WBC: 9.2 10*3/uL (ref 4.0–10.5)
nRBC: 0 % (ref 0.0–0.2)

## 2022-12-01 LAB — BASIC METABOLIC PANEL
Anion gap: 9 (ref 5–15)
BUN: 15 mg/dL (ref 8–23)
CO2: 25 mmol/L (ref 22–32)
Calcium: 9 mg/dL (ref 8.9–10.3)
Chloride: 97 mmol/L — ABNORMAL LOW (ref 98–111)
Creatinine, Ser: 1.26 mg/dL — ABNORMAL HIGH (ref 0.61–1.24)
GFR, Estimated: >60 mL/min (ref >60)
Glucose, Bld: 201 mg/dL — ABNORMAL HIGH (ref 70–99)
Potassium: 3.7 mmol/L (ref 3.5–5.1)
Sodium: 131 mmol/L — ABNORMAL LOW (ref 135–145)

## 2022-12-01 LAB — GLUCOSE, CAPILLARY
Glucose-Capillary: 166 mg/dL — ABNORMAL HIGH (ref 70–99)
Glucose-Capillary: 270 mg/dL — ABNORMAL HIGH (ref 70–99)
Glucose-Capillary: 211 mg/dL — ABNORMAL HIGH (ref 70–99)
Glucose-Capillary: 190 mg/dL — ABNORMAL HIGH (ref 70–99)

## 2022-12-01 LAB — HEMOGLOBIN A1C
Hgb A1c MFr Bld: 8.4 % — ABNORMAL HIGH (ref 4.8–5.6)
Mean Plasma Glucose: 194 mg/dL

## 2022-12-01 LAB — LIPID PANEL
Cholesterol: 138 mg/dL (ref 0–200)
HDL: 39 mg/dL — ABNORMAL LOW (ref >40)
LDL Cholesterol: 84 mg/dL (ref 0–99)
Total CHOL/HDL Ratio: 3.5 RATIO
Triglycerides: 74 mg/dL (ref <150)
VLDL: 15 mg/dL (ref 0–40)

## 2022-12-01 LAB — HEPARIN LEVEL (UNFRACTIONATED): Heparin Unfractionated: 0.33 IU/mL (ref 0.30–0.70)

## 2022-12-01 LAB — APTT: aPTT: 51 seconds — ABNORMAL HIGH (ref 24–36)

## 2022-12-01 LAB — TSH: TSH: 2.49 u[IU]/mL (ref 0.350–4.500)

## 2022-12-01 MED ORDER — APIXABAN 5 MG PO TABS
5.0000 mg | ORAL_TABLET | Freq: Two times a day (BID) | ORAL | Status: DC
  Administered 2022-12-01 – 2022-12-02 (×3): 5 mg via ORAL
  Filled 2022-12-01 (×3): qty 1

## 2022-12-01 MED ORDER — ROSUVASTATIN CALCIUM 5 MG PO TABS
10.0000 mg | ORAL_TABLET | ORAL | Status: DC
  Administered 2022-12-02: 10 mg via ORAL
  Filled 2022-12-01: qty 2

## 2022-12-01 MED ORDER — LIVING BETTER WITH HEART FAILURE BOOK: Freq: Once | Status: AC

## 2022-12-01 MED ORDER — FUROSEMIDE 10 MG/ML IJ SOLN
40.0000 mg | Freq: Once | INTRAMUSCULAR | Status: AC
  Administered 2022-12-01: 40 mg via INTRAVENOUS
  Filled 2022-12-01: qty 4

## 2022-12-01 MED ORDER — GLIPIZIDE 10 MG PO TABS
10.0000 mg | ORAL_TABLET | Freq: Two times a day (BID) | ORAL | Status: DC
  Administered 2022-12-01 (×2): 10 mg via ORAL
  Filled 2022-12-01 (×4): qty 1

## 2022-12-01 MED ORDER — POTASSIUM CHLORIDE CRYS ER 20 MEQ PO TBCR
40.0000 meq | EXTENDED_RELEASE_TABLET | Freq: Once | ORAL | Status: AC
  Administered 2022-12-01: 40 meq via ORAL
  Filled 2022-12-01: qty 2

## 2022-12-01 MED ORDER — ASPIRIN 81 MG PO TBEC
81.0000 mg | DELAYED_RELEASE_TABLET | Freq: Every day | ORAL | Status: DC
  Administered 2022-12-01 – 2022-12-02 (×2): 81 mg via ORAL
  Filled 2022-12-01 (×2): qty 1

## 2022-12-01 MED ORDER — DAPAGLIFLOZIN PROPANEDIOL 10 MG PO TABS
10.0000 mg | ORAL_TABLET | Freq: Every day | ORAL | Status: DC
  Administered 2022-12-01 – 2022-12-02 (×2): 10 mg via ORAL
  Filled 2022-12-01 (×2): qty 1

## 2022-12-01 NOTE — Progress Notes (Signed)
[facility] for heparin Indication: atrial fibrillation  No Known Allergies  Patient Measurements: Height: 5\' 9"  (175.3 cm) Weight: 85.3 kg (188 lb) IBW/kg (Calculated) : 70.7 Heparin Dosing Weight: 85kg  Vital Signs: Temp: 99.3 F (37.4 C) (03/21 0339) Temp Source: Oral (03/21 0339) BP: 130/70 (03/21 0339) Pulse Rate: 71 (03/21 0339)  Labs: Recent Labs    12/01/22 0446  HGB 12.3*  HCT 37.5*  PLT 196  APTT 51*  HEPARINUNFRC 0.33    Estimated Creatinine Clearance: 56 mL/min (A) (by C-G formula based on SCr of 1.31 mg/dL (H)).   Medical History: Past Medical History:  Diagnosis Date   A-fib ([place])    CAD (coronary artery disease)    Hypertension    Obstructive sleep apnea    CPAP   Rosacea    Type 2 diabetes mellitus with hyperglycemia (HCC)     Medications:  Medications Prior to Admission  Medication Sig Dispense Refill Last Dose   amLODipine (NORVASC) 10 MG tablet Take 10 mg by mouth daily.   11/29/2022   apixaban (ELIQUIS) 5 MG TABS tablet Take 1 tablet (5 mg total) by mouth 2 (two) times daily. 60 tablet 5 11/27/2022   aspirin EC 81 MG tablet Take 1 tablet (81 mg total) by mouth daily. Swallow whole. 90 tablet 3 11/30/2022 at 0930   Dulaglutide (TRULICITY) 3 0000000 SOPN Inject 3 mg into the skin every Saturday.   Past Week   ergocalciferol (VITAMIN D2) 1.25 MG (50000 UT) capsule Take 50,000 Units by mouth every Saturday.   Past Week   glipiZIDE (GLUCOTROL) 10 MG tablet Take 10 mg by mouth 2 (two) times daily before a meal.   11/29/2022   hydrochlorothiazide (HYDRODIURIL) 12.5 MG tablet Take 12.5 mg by mouth every other day.   11/29/2022   metFORMIN (GLUCOPHAGE-XR) 500 MG 24 hr tablet Take 500 mg by mouth 2 (two) times daily with a meal.   11/28/2022   pioglitazone (ACTOS) 15 MG tablet Take 15 mg by mouth daily.   11/29/2022   rosuvastatin (CRESTOR) 10 MG tablet Take 10 mg by mouth See admin instructions. Takes 10 mg on Mondays  and Fridays   11/29/2022   valsartan (DIOVAN) 320 MG tablet Take 320 mg by mouth daily.   11/29/2022   triamcinolone cream (KENALOG) 0.1 % Apply 1 Application topically 2 (two) times daily as needed (dry skin).   More than a month   Scheduled:   insulin aspart  0-15 Units Subcutaneous TID WC   sodium chloride flush  3 mL Intravenous Q12H   Infusions:   sodium chloride     heparin 1,250 Units/hr (11/30/22 2011)    Assessment: Pt has been on apixaban for his AF prior to admission. Last dose was 3/17 based on medrec. He had aortogram today. Plan to bridge with heparin 6h post sheath removal. Likely transition back to apixaban in AM.  3/21 AM update:  aPTT sub-therapeutic   Goal of Therapy:  Heparin level 0.3-0.7 units/ml aPTT 66-102 seconds Monitor platelets by anticoagulation protocol: Yes   Plan:  Inc heparin to 1350 units/hr 1400 heparin level and aPTT  Narda Bonds, PharmD, BCPS Clinical Pharmacist Phone: [phone]

## 2022-12-01 NOTE — Progress Notes (Signed)
Pt's CBG 370, pt had cup of fruit right before sugar check. NO night time coverage, on call MD paged. Awaiting orders. Plan of care continues.

## 2022-12-01 NOTE — Progress Notes (Signed)
SATURATION QUALIFICATIONS: (This note is used to comply with regulatory documentation for home oxygen)  Patient Saturations on Room Air at Rest = 90%  Patient Saturations on Room Air while Ambulating = 85%  Patient Saturations on 3 Liters of oxygen while Ambulating = 94%  Please briefly explain why patient needs home oxygen: Pr dropped bellow 88% while ambulating on room air

## 2022-12-01 NOTE — Progress Notes (Signed)
[facility] for heparin > apixaban  Indication: atrial fibrillation  No Known Allergies  Patient Measurements: Height: 5\' 9"  (175.3 cm) Weight: 85.3 kg (188 lb) IBW/kg (Calculated) : 70.7 Heparin Dosing Weight: 85kg  Vital Signs: Temp: 98.4 F (36.9 C) (03/21 0716) Temp Source: Oral (03/21 0716) BP: 123/76 (03/21 0716) Pulse Rate: 70 (03/21 0716)  Labs: Recent Labs    12/01/22 0446  HGB 12.3*  HCT 37.5*  PLT 196  APTT 51*  HEPARINUNFRC 0.33  CREATININE 1.26*     Estimated Creatinine Clearance: 58.2 mL/min (A) (by C-G formula based on SCr of 1.26 mg/dL (H)).   Medical History: Past Medical History:  Diagnosis Date   A-fib ([place])    CAD (coronary artery disease)    Hypertension    Obstructive sleep apnea    CPAP   Rosacea    Type 2 diabetes mellitus with hyperglycemia (HCC)     Medications:  Medications Prior to Admission  Medication Sig Dispense Refill Last Dose   amLODipine (NORVASC) 10 MG tablet Take 10 mg by mouth daily.   11/29/2022   apixaban (ELIQUIS) 5 MG TABS tablet Take 1 tablet (5 mg total) by mouth 2 (two) times daily. 60 tablet 5 11/27/2022   aspirin EC 81 MG tablet Take 1 tablet (81 mg total) by mouth daily. Swallow whole. 90 tablet 3 11/30/2022 at 0930   Dulaglutide (TRULICITY) 3 0000000 SOPN Inject 3 mg into the skin every Saturday.   Past Week   ergocalciferol (VITAMIN D2) 1.25 MG (50000 UT) capsule Take 50,000 Units by mouth every Saturday.   Past Week   glipiZIDE (GLUCOTROL) 10 MG tablet Take 10 mg by mouth 2 (two) times daily before a meal.   11/29/2022   hydrochlorothiazide (HYDRODIURIL) 12.5 MG tablet Take 12.5 mg by mouth every other day.   11/29/2022   metFORMIN (GLUCOPHAGE-XR) 500 MG 24 hr tablet Take 500 mg by mouth 2 (two) times daily with a meal.   11/28/2022   pioglitazone (ACTOS) 15 MG tablet Take 15 mg by mouth daily.   11/29/2022   rosuvastatin (CRESTOR) 10 MG tablet Take 10 mg by mouth See admin  instructions. Takes 10 mg on Mondays and Fridays   11/29/2022   valsartan (DIOVAN) 320 MG tablet Take 320 mg by mouth daily.   11/29/2022   triamcinolone cream (KENALOG) 0.1 % Apply 1 Application topically 2 (two) times daily as needed (dry skin).   More than a month   Scheduled:   apixaban  5 mg Oral BID   aspirin EC  81 mg Oral Daily   furosemide  40 mg Intravenous Once   glipiZIDE  10 mg Oral BID AC   insulin aspart  0-15 Units Subcutaneous TID WC   Living Better with Heart Failure Book   Does not apply Once   potassium chloride  40 mEq Oral Once   [START ON 12/02/2022] rosuvastatin  10 mg Oral Once per day on Mon Fri   sodium chloride flush  3 mL Intravenous Q12H   Infusions:   sodium chloride      Assessment: Pt had been on apixaban for his AF prior to admission. Last dose was 3/17 based on medrec. S/p heparin bridge for angiogram to evaluate PAD  Resume pta apixaban age < 24 wt > 60kg Cr < 1.5   Goal of Therapy:  Heparin level 0.3-0.7 units/ml aPTT 66-102 seconds Monitor platelets by anticoagulation protocol: Yes   Plan:  Apixaban 5mg   BID Monitor s/s bleeding    Bonnita Nasuti Pharm.D. CPP, BCPS Clinical Pharmacist [phone] 12/01/2022 10:24 AM

## 2022-12-01 NOTE — Progress Notes (Addendum)
Progress Note  Patient Name: [name] Date of Encounter: 12/01/2022  Primary Cardiologist: Shelva Majestic, MD  Subjective   Feeling much better this AM. Hopeful to go home. Has not had clinical heart failure before. Per d/w APP who evaluated patient with Dr. Fletcher Anon yesterday, the IVF received prior to procedure was out of proportion to the degree of diuresis so question whether patient was volume up prior to procedure. Fortunately he had not had any recent symptoms.  Tele shows intermittent afib (rate controlled) as well as regular rhythm with what looks like atrial flutter - will get repeat EKG to confirm, low P wave amplitude.  Inpatient Medications    Scheduled Meds:  insulin aspart  0-15 Units Subcutaneous TID WC   sodium chloride flush  3 mL Intravenous Q12H   Continuous Infusions:  sodium chloride     heparin 1,350 Units/hr (12/01/22 0540)   PRN Meds: sodium chloride, acetaminophen, ondansetron (ZOFRAN) IV, sodium chloride flush   Vital Signs    Vitals:   12/01/22 0339 12/01/22 0400 12/01/22 0600 12/01/22 0716  BP: 130/70 (!) 145/70 131/69 123/76  Pulse: 71   70  Resp: 17   20  Temp: 99.3 F (37.4 C)   98.4 F (36.9 C)  TempSrc: Oral   Oral  SpO2: 93% 96% 92% 92%  Weight:      Height:        Intake/Output Summary (Last 24 hours) at 12/01/2022 0800 Last data filed at 12/01/2022 0600 Gross per 24 hour  Intake 269.12 ml  Output 5230 ml  Net -4960.88 ml      11/30/2022   10:33 AM 11/22/2022    8:57 AM 10/26/2022   11:24 AM  Last 3 Weights  Weight (lbs) 188 lb 190 lb 189 lb  Weight (kg) 85.276 kg 86.183 kg 85.73 kg     Telemetry    As above - Personally Reviewed  ECG    Pending - Personally Reviewed  Physical Exam   GEN: No acute distress.  HEENT: Normocephalic, atraumatic, sclera non-icteric. Neck: No JVD or bruits. Cardiac: RRR no murmurs, rubs, or gallops.  Respiratory: Clear to auscultation bilaterally. Breathing is unlabored. GI: Soft,  nontender, non-distended, BS +x 4. MS: no deformity. Extremities: No clubbing or cyanosis. No edema. Distal pedal pulses are 2+ and equal bilaterally. Left groin cath site without hematoma, ecchymosis, or bruit. Neuro:  AAOx3. Follows commands. Psych:  Responds to questions appropriately with a normal affect.  Labs    High Sensitivity Troponin:  No results for input(s): "TROPONINIHS" in the last 720 hours.    Cardiac EnzymesNo results for input(s): "TROPONINI" in the last 168 hours. No results for input(s): "TROPIPOC" in the last 168 hours.   ChemistryNo results for input(s): "NA", "K", "CL", "CO2", "GLUCOSE", "BUN", "CREATININE", "CALCIUM", "PROT", "ALBUMIN", "AST", "ALT", "ALKPHOS", "BILITOT", "GFRNONAA", "GFRAA", "ANIONGAP" in the last 168 hours.   Hematology Recent Labs  Lab 12/01/22 0446  WBC 9.2  RBC 4.27  HGB 12.3*  HCT 37.5*  MCV 87.8  MCH 28.8  MCHC 32.8  RDW 14.0  PLT 196    BNPNo results for input(s): "BNP", "PROBNP" in the last 168 hours.   DDimer No results for input(s): "DDIMER" in the last 168 hours.   Radiology    ECHOCARDIOGRAM COMPLETE  Result Date: 11/30/2022    ECHOCARDIOGRAM REPORT   Patient Name:   [name] Date of Exam: 11/30/2022 Medical Rec #:  [id number]       Height:  69.0 in Accession #:    [id number]      Weight:       188.0 lb Date of Birth:  Aug 29, 1951       BSA:          2.012 m Patient Age:    72 years        BP:           145/69 mmHg Patient Gender: M               HR:           73 bpm. Exam Location:  Inpatient Procedure: 2D Echo, Cardiac Doppler and Color Doppler Indications:    R06.02 SOB  History:        Patient has prior history of Echocardiogram examinations, most                 recent 01/20/2021. Signs/Symptoms:Chest Pain and Shortness of                 Breath.  Sonographer:    Rolla Etienne Referring Phys: Kathlyn [place], A  Sonographer Comments: Patient position. IMPRESSIONS  1. Left ventricular ejection fraction, by  estimation, is 50 to 55%. The left ventricle has low normal function. The left ventricle demonstrates regional wall motion abnormalities (see scoring diagram/findings for description). Left ventricular diastolic  parameters are indeterminate. There is hypokinesis of the left ventricular, apical anterior wall and apical segment.  2. Right ventricular systolic function is normal. The right ventricular size is normal.  3. Left atrial size was moderately dilated.  4. Right atrial size was moderately dilated.  5. The mitral valve is normal in structure. Mild mitral valve regurgitation. No evidence of mitral stenosis.  6. The aortic valve is tricuspid. There is mild calcification of the aortic valve. Aortic valve regurgitation is not visualized. Mild aortic valve stenosis. Aortic valve area, by VTI measures 1.80 cm. Aortic valve mean gradient measures 7.0 mmHg. Aortic valve Vmax measures 1.73 m/s.  7. The inferior vena cava is normal in size with greater than 50% respiratory variability, suggesting right atrial pressure of 3 mmHg. FINDINGS  Left Ventricle: Left ventricular ejection fraction, by estimation, is 50 to 55%. The left ventricle has low normal function. The left ventricle demonstrates regional wall motion abnormalities. The left ventricular internal cavity size was normal in size. There is no left ventricular hypertrophy. Left ventricular diastolic parameters are indeterminate. Right Ventricle: The right ventricular size is normal. No increase in right ventricular wall thickness. Right ventricular systolic function is normal. Left Atrium: Left atrial size was moderately dilated. Right Atrium: Right atrial size was moderately dilated. Pericardium: There is no evidence of pericardial effusion. Mitral Valve: The mitral valve is normal in structure. Mild mitral valve regurgitation. No evidence of mitral valve stenosis. Tricuspid Valve: The tricuspid valve is normal in structure. Tricuspid valve regurgitation is  trivial. No evidence of tricuspid stenosis. Aortic Valve: The aortic valve is tricuspid. There is mild calcification of the aortic valve. Aortic valve regurgitation is not visualized. Mild aortic stenosis is present. Aortic valve mean gradient measures 7.0 mmHg. Aortic valve peak gradient measures  12.0 mmHg. Aortic valve area, by VTI measures 1.80 cm. Pulmonic Valve: The pulmonic valve was normal in structure. Pulmonic valve regurgitation is not visualized. No evidence of pulmonic stenosis. Aorta: The aortic root is normal in size and structure. Venous: The inferior vena cava is normal in size with greater than 50% respiratory variability, suggesting right atrial pressure of 3  mmHg. IAS/Shunts: No atrial level shunt detected by color flow Doppler.  LEFT VENTRICLE PLAX 2D LVIDd:         5.10 cm     Diastology LVIDs:         3.50 cm     LV e' medial:    9.68 cm/s LV PW:         1.10 cm     LV E/e' medial:  13.5 LV IVS:        1.00 cm     LV e' lateral:   11.10 cm/s LVOT diam:     1.80 cm     LV E/e' lateral: 11.8 LV SV:         54 LV SV Index:   27 LVOT Area:     2.54 cm  LV Volumes (MOD) LV vol d, MOD A2C: 96.2 ml LV vol d, MOD A4C: 73.3 ml LV vol s, MOD A2C: 58.4 ml LV vol s, MOD A4C: 54.1 ml LV SV MOD A2C:     37.8 ml LV SV MOD A4C:     73.3 ml LV SV MOD BP:      29.7 ml RIGHT VENTRICLE RV Basal diam:  3.60 cm RV Mid diam:    2.80 cm RV S prime:     10.10 cm/s TAPSE (M-mode): 1.8 cm LEFT ATRIUM             Index        RIGHT ATRIUM           Index LA diam:        4.10 cm 2.04 cm/m   RA Area:     20.80 cm LA Vol (A2C):   87.8 ml 43.63 ml/m  RA Volume:   59.20 ml  29.42 ml/m LA Vol (A4C):   70.7 ml 35.13 ml/m LA Biplane Vol: 78.8 ml 39.16 ml/m  AORTIC VALVE AV Area (Vmax):    2.04 cm AV Area (Vmean):   1.87 cm AV Area (VTI):     1.80 cm AV Vmax:           173.00 cm/s AV Vmean:          119.000 cm/s AV VTI:            0.303 m AV Peak Grad:      12.0 mmHg AV Mean Grad:      7.0 mmHg LVOT Vmax:          139.00 cm/s LVOT Vmean:        87.500 cm/s LVOT VTI:          0.214 m LVOT/AV VTI ratio: 0.71 MITRAL VALVE MV Area (PHT): 4.53 cm     SHUNTS MV Decel Time: 168 msec     Systemic VTI:  0.21 m MV E velocity: 131.00 cm/s  Systemic Diam: 1.80 cm MV A velocity: 65.10 cm/s MV E/A ratio:  2.01 Glori Bickers MD Electronically signed by Glori Bickers MD Signature Date/Time: 11/30/2022/2:43:32 PM    Final    PERIPHERAL VASCULAR CATHETERIZATION  Result Date: 11/30/2022 1.  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 2.  No significant aortoiliac disease. 3.  Right lower extremity: Calcified vessels with mild nonobstructive disease affecting the SFA and popliteal arteries, two-vessel runoff below the knee via the posterior tibial and peroneal artery.  Anterior tibial artery occluded distally and the dorsalis pedis fills retrograde from the pedal arch. Recommendations: The patient has inline flow to the right toes  via the posterior tibial artery.  No indication for revascularization.  Continue medical therapy. Resume Eliquis tomorrow as long as no bleeding issues. 50 mL of contrast was used for the procedure.  The patient did develop shortness of breath and hypoxia with IV fluid hydration and was given 1 dose of IV furosemide before the procedure.  He can be discharged home as long as he is not requiring any supplemental oxygen. Will plan on obtaining renal artery duplex as an outpatient to evaluate left kidney size and flow to make decision regarding left renal artery revascularization    Cardiac Studies   PV Angio 11/30/22 1.  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 2.  No significant aortoiliac disease. 3.  Right lower extremity: Calcified vessels with mild nonobstructive disease affecting the SFA and popliteal arteries, two-vessel runoff below the knee via the posterior tibial and peroneal artery.  Anterior tibial artery occluded distally and the dorsalis  pedis fills retrograde from the pedal arch.   Recommendations: The patient has inline flow to the right toes via the posterior tibial artery.  No indication for revascularization.  Continue medical therapy. Resume Eliquis tomorrow as long as no bleeding issues. 50 mL of contrast was used for the procedure.  The patient did develop shortness of breath and hypoxia with IV fluid hydration and was given 1 dose of IV furosemide before the procedure.  He can be discharged home as long as he is not requiring any supplemental oxygen. Will plan on obtaining renal artery duplex as an outpatient to evaluate left kidney size and flow to make decision regarding left renal artery revascularization  2D echo 11/30/22    1. Left ventricular ejection fraction, by estimation, is 50 to 55%. The  left ventricle has low normal function. The left ventricle demonstrates  regional wall motion abnormalities (see scoring diagram/findings for  description). Left ventricular diastolic   parameters are indeterminate. There is hypokinesis of the left  ventricular, apical anterior wall and apical segment.   2. Right ventricular systolic function is normal. The right ventricular  size is normal.   3. Left atrial size was moderately dilated.   4. Right atrial size was moderately dilated.   5. The mitral valve is normal in structure. Mild mitral valve  regurgitation. No evidence of mitral stenosis.   6. The aortic valve is tricuspid. There is mild calcification of the  aortic valve. Aortic valve regurgitation is not visualized. Mild aortic  valve stenosis. Aortic valve area, by VTI measures 1.80 cm. Aortic valve  mean gradient measures 7.0 mmHg.  Aortic valve Vmax measures 1.73 m/s.   7. The inferior vena cava is normal in size with greater than 50%  respiratory variability, suggesting right atrial pressure of 3 mmHg.     Patient Profile     72 y.o. 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 Previously admitted 2022 for syncope, found to have 3/5 grafts patent and wore a monitor showing atrial fib with rates as low as the 30s (predominantly during sleeping hours, longest pause 4.7 sec) with average HR 60bpm, back in NSR in follow-up. He was recently seen in the office for claudication. The patient had his right toenails clipped about 8 weeks ago and also  has an ingrown first toenail. He subsequently developed an ulceration on the right second toe that has worsened over the last few weeks, becoming a full wound with dark discoloration followed by podiatry. He was found to have abnormal ABIs and saw Dr. Fletcher Anon who brought him in for PV angiography. This was done yesterday showing subtotally occluded L renal artery with atrophy of left kidney, RLE with disease outlined above but no indication for revascularization, and no significant aortoiliac disease. Post-procedure he was noted to be hypoxic and volume overloaded in atrial fib so was admitted for further management.  Assessment & Plan    1. Acute respiratory failure with hypoxia suspected due to acute on chronic HFpEF, unclear precipitant - 2D echo with EF 50-55% (similar to prior) + WMA in similar territory as prior, moderate BAE, mild MR, mild AS - will review with MD whether occluded renal has something to do with this presentation - IV Lasix 60mg  given yesterday with marked diuresis (put out 5.2L of urine with just one dose) - will order f/u CXR this AM but clinically looks good - reviewed 2g sodium restriction, 64 oz fluid restriction, daily weights with patient, will rx CHF booklet - would hold off resuming Actos at DC - consider SGLT2i instead - have ordered O2 assessment witth rest/ambulation - will review further eval  with MD  2. Recurrent atrial fibrillation/atrial flutter with controlled ventricular response - not requiring any AVN blocking agents (prior brady on monitor in 2022) - on IV heparin per pharmacy, anticipate resumption of Eliquis this AM pending MD input - monitor shows intermittent afib but also a fairly regular rhythm with very low P wave amplitude though I suspect atrial flutter - will repeat EKG to confirm - patient has no cardiac awareness of arrhythmia - given prior monitor in 2022 in 100% Afib, question whether 10/2022 EKG truly was sinus - add TSH to labs  3. PAD - subtotally occluded L renal artery with atrophy of L kidney - > Dr. Fletcher Anon plans OP renal artery duplex to evaluate left kidney size and flow to make decision regarding left renal artery revascularization  - RLE showed calcified vessels with mild nonobstructive disease affecting the SFA and popliteal arteries, two-vessel runoff below the knee via the posterior tibial and peroneal artery, anterior tibial artery occluded distally and the dorsalis pedis fills retrograde from the pedal arch -> no indication for revascularization, medical management  - will order to replace PV tegaderm with bandaid, cath site looks fine  4. Mild MR, Mild AS by echo - anticipate OP monitoring with consideration of repeat echo in 3-5 years  5. Hyponatremia - Na 131 corrects to 133 for glucose; prior values as low as 132 so this is not new  6. CKD stage 2-3a - pre-cath Cr 1.31, recheck stable at 1.26  7. Diabetes mellitus - resume glipizide - anticipate resumption of metformin on 3/23 - consider alternative to Actos as OP given CHF - resume GLP1 after dc  8. HTN - on amlodipine, valsartan, HCTZ QOD as OP - will review regimen with MD  9. CAD s/p remote PCI 2001, CABG in 2003 - on ASA PTA - not on BB with prior bradycardia - resume rosuvastatin and revisit lipid management as OP  10. OSA on CPAP - will order QHS in case still in house  this PM  Has f/u with APP 4/23 with Isaac Laud, will review dispo plans with MD  Addendum: EKG shows either coarse afib vs atrial flutter (  first one registering as STEMI due to artifact/lead movement but second tracing confirms no STEMI). Still drops his O2 with ambulation. CXR nonacute. D/w Dr. Harrell Gave with the following recs: give another dose of IV Lasix 40mg  today and keep in house, change heparin back to apixaban, add back baby ASA, hold off resumption of other antihypertensives pending BP trends with diuresis, and pursue renal artery duplex as inpatient tomorrow given typically need to be NPO for study. If done in the hospital will need to cx OP order. PharmD repleting K.  For questions or updates, please contact [place] Please consult [email or web address] for contact info under Cardiology/STEMI.  Signed, Charlie Pitter, PA-C 12/01/2022, 8:00 AM

## 2022-12-02 ENCOUNTER — Inpatient Hospital Stay (HOSPITAL_COMMUNITY): Payer: Medicare Other

## 2022-12-02 ENCOUNTER — Telehealth: Payer: Self-pay | Admitting: Physician Assistant

## 2022-12-02 ENCOUNTER — Encounter (HOSPITAL_COMMUNITY): Payer: Medicare Other

## 2022-12-02 DIAGNOSIS — I739 Peripheral vascular disease, unspecified: Secondary | ICD-10-CM | POA: Diagnosis not present

## 2022-12-02 DIAGNOSIS — I484 Atypical atrial flutter: Secondary | ICD-10-CM | POA: Diagnosis not present

## 2022-12-02 DIAGNOSIS — J9601 Acute respiratory failure with hypoxia: Secondary | ICD-10-CM | POA: Insufficient documentation

## 2022-12-02 DIAGNOSIS — I5033 Acute on chronic diastolic (congestive) heart failure: Secondary | ICD-10-CM | POA: Diagnosis not present

## 2022-12-02 DIAGNOSIS — I10 Essential (primary) hypertension: Secondary | ICD-10-CM | POA: Diagnosis not present

## 2022-12-02 LAB — BASIC METABOLIC PANEL
Anion gap: 8 (ref 5–15)
BUN: 22 mg/dL (ref 8–23)
CO2: 24 mmol/L (ref 22–32)
Calcium: 8.8 mg/dL — ABNORMAL LOW (ref 8.9–10.3)
Chloride: 101 mmol/L (ref 98–111)
Creatinine, Ser: 1.49 mg/dL — ABNORMAL HIGH (ref 0.61–1.24)
GFR, Estimated: 50 mL/min — ABNORMAL LOW (ref >60)
Glucose, Bld: 100 mg/dL — ABNORMAL HIGH (ref 70–99)
Potassium: 3.2 mmol/L — ABNORMAL LOW (ref 3.5–5.1)
Sodium: 133 mmol/L — ABNORMAL LOW (ref 135–145)

## 2022-12-02 LAB — GLUCOSE, CAPILLARY
Glucose-Capillary: 85 mg/dL (ref 70–99)
Glucose-Capillary: 271 mg/dL — ABNORMAL HIGH (ref 70–99)

## 2022-12-02 LAB — MAGNESIUM: Magnesium: 2 mg/dL (ref 1.7–2.4)

## 2022-12-02 LAB — CBC
HCT: 35.4 % — ABNORMAL LOW (ref 39.0–52.0)
Hemoglobin: 11.9 g/dL — ABNORMAL LOW (ref 13.0–17.0)
MCH: 29.2 pg (ref 26.0–34.0)
MCHC: 33.6 g/dL (ref 30.0–36.0)
MCV: 87 fL (ref 80.0–100.0)
Platelets: 203 10*3/uL (ref 150–400)
RBC: 4.07 MIL/uL — ABNORMAL LOW (ref 4.22–5.81)
RDW: 13.9 % (ref 11.5–15.5)
WBC: 8.9 10*3/uL (ref 4.0–10.5)
nRBC: 0 % (ref 0.0–0.2)

## 2022-12-02 MED ORDER — POTASSIUM CHLORIDE CRYS ER 20 MEQ PO TBCR
40.0000 meq | EXTENDED_RELEASE_TABLET | Freq: Once | ORAL | Status: AC
  Administered 2022-12-02: 40 meq via ORAL
  Filled 2022-12-02: qty 2

## 2022-12-02 MED ORDER — POTASSIUM CHLORIDE CRYS ER 20 MEQ PO TBCR: 40.0000 meq | EXTENDED_RELEASE_TABLET | ORAL | Status: DC

## 2022-12-02 MED ORDER — FUROSEMIDE 20 MG PO TABS: 20.0000 mg | ORAL_TABLET | Freq: Every day | ORAL | 1 refills | Status: DC | PRN

## 2022-12-02 MED ORDER — AMLODIPINE BESYLATE 10 MG PO TABS: 10.0000 mg | ORAL_TABLET | Freq: Every day | ORAL | Status: DC

## 2022-12-02 MED ORDER — POTASSIUM CHLORIDE CRYS ER 20 MEQ PO TBCR
20.0000 meq | EXTENDED_RELEASE_TABLET | Freq: Once | ORAL | Status: AC
  Administered 2022-12-02: 20 meq via ORAL
  Filled 2022-12-02: qty 1

## 2022-12-02 MED ORDER — DAPAGLIFLOZIN PROPANEDIOL 10 MG PO TABS: 10.0000 mg | ORAL_TABLET | Freq: Every day | ORAL | 5 refills | Status: DC

## 2022-12-02 NOTE — Progress Notes (Signed)
Mobility Specialist Progress Note:   12/02/22 1000  Mobility  Activity Ambulated with assistance in hallway  Level of Assistance Modified independent, requires aide device or extra time  Assistive Device [facility] Ambulated (ft) 450 ft  Activity Response Tolerated well  $Mobility charge 1 Mobility   Pt in chair willing to participate in mobility. No complaints of pain. Left in chair with call bell in reach and all needs met.   Gareth Eagle Marx Doig Mobility Specialist Please contact via [employer] or  Rehab Office at [phone]

## 2022-12-02 NOTE — Progress Notes (Addendum)
Progress Note  Patient Name: [name] Date of Encounter: 12/02/2022  Primary Cardiologist: Minus Breeding, MD  Subjective   Feeling fine, no complaints, though felt fine yesterday and still put out 3.2L. Have requested O2 assessment with nursing. Briefly took O2 off at bedside and pt maintained 96-98% RA, O2 replaced pending formal assessment. Vasc tech reached out to clarify plan for renal duplex, will obtain today as planned.   Some brady rates upper 30s seen between 6-7am. Pt unsure if he was awake or asleep at that time. Similarly noted in 2022 monitor.  Inpatient Medications    Scheduled Meds:  apixaban  5 mg Oral BID   aspirin EC  81 mg Oral Daily   dapagliflozin propanediol  10 mg Oral Daily   glipiZIDE  10 mg Oral BID AC   insulin aspart  0-15 Units Subcutaneous TID WC   potassium chloride  40 mEq Oral Once   Followed by   potassium chloride  20 mEq Oral Once   rosuvastatin  10 mg Oral Once per day on Mon Fri   sodium chloride flush  3 mL Intravenous Q12H   Continuous Infusions:  sodium chloride     PRN Meds: sodium chloride, acetaminophen, ondansetron (ZOFRAN) IV, sodium chloride flush   Vital Signs    Vitals:   12/01/22 2320 12/02/22 0422 12/02/22 0626 12/02/22 0758  BP: (!) 129/56 134/65  (!) 117/55  Pulse: 72 70  (!) 54  Resp: 16 18  18   Temp: 99.3 F (37.4 C) 98.5 F (36.9 C)  97.6 F (36.4 C)  TempSrc: Oral Oral  Oral  SpO2: 95% 97%  93%  Weight:   84.1 kg   Height:        Intake/Output Summary (Last 24 hours) at 12/02/2022 0818 Last data filed at 12/02/2022 0423 Gross per 24 hour  Intake 120 ml  Output 3235 ml  Net -3115 ml      12/02/2022    6:26 AM 11/30/2022   10:33 AM 11/22/2022    8:57 AM  Last 3 Weights  Weight (lbs) 185 lb 6.5 oz 188 lb 190 lb  Weight (kg) 84.1 kg 85.276 kg 86.183 kg     Telemetry    Atrial fib/flutter, no rapid rates, nadir 39bpm during 6-7am hour, rare PVC - Personally Reviewed  ECG    N/a -  Personally Reviewed  Physical Exam   GEN: No acute distress.  HEENT: Normocephalic, atraumatic, sclera non-icteric. Neck: No JVD or bruits. Cardiac: irregular, no murmurs, rubs, or gallops.  Respiratory: Clear to auscultation bilaterally. Breathing is unlabored. GI: Soft, nontender, non-distended, BS +x 4. MS: no deformity. Extremities: No clubbing or cyanosis. No edema. Distal pedal pulses are 2+ and equal bilaterally. Left groin cath site without hematoma, ecchymosis, or bruit. Neuro:  AAOx3. Follows commands. Psych:  Responds to questions appropriately with a normal affect.  Labs    High Sensitivity Troponin:  No results for input(s): "TROPONINIHS" in the last 720 hours.    Cardiac EnzymesNo results for input(s): "TROPONINI" in the last 168 hours. No results for input(s): "TROPIPOC" in the last 168 hours.   Chemistry Recent Labs  Lab 12/01/22 0446 12/02/22 0116  NA 131* 133*  K 3.7 3.2*  CL 97* 101  CO2 25 24  GLUCOSE 201* 100*  BUN 15 22  CREATININE 1.26* 1.49*  CALCIUM 9.0 8.8*  GFRNONAA >60 50*  ANIONGAP 9 8     Hematology Recent Labs  Lab 12/01/22 0446 12/02/22 0116  WBC 9.2 8.9  RBC 4.27 4.07*  HGB 12.3* 11.9*  HCT 37.5* 35.4*  MCV 87.8 87.0  MCH 28.8 29.2  MCHC 32.8 33.6  RDW 14.0 13.9  PLT 196 203    BNPNo results for input(s): "BNP", "PROBNP" in the last 168 hours.   DDimer No results for input(s): "DDIMER" in the last 168 hours.   Radiology    DG Chest 2 View  Result Date: 12/01/2022 CLINICAL DATA:  Hypoxia EXAM: CHEST - 2 VIEW COMPARISON:  Previous studies including the examination of 05/29/2007 FINDINGS: The cardiac size is within normal limits. There is previous coronary bypass surgery. Coronary artery stent is noted. There are no signs of pulmonary edema or focal pulmonary consolidation. There is no pleural effusion or pneumothorax. IMPRESSION: There are no signs of pulmonary edema or new focal infiltrates. Coronary artery disease.  Electronically Signed   By: Elmer Picker M.D.   On: 12/01/2022 09:34   ECHOCARDIOGRAM COMPLETE  Result Date: 11/30/2022    ECHOCARDIOGRAM REPORT   Patient Name:   [name] Date of Exam: 11/30/2022 Medical Rec #:  [id number]       Height:       69.0 in Accession #:    [id number]      Weight:       188.0 lb Date of Birth:  Apr 09, 1951       BSA:          2.012 m Patient Age:    72 years        BP:           145/69 mmHg Patient Gender: M               HR:           73 bpm. Exam Location:  Inpatient Procedure: 2D Echo, Cardiac Doppler and Color Doppler Indications:    R06.02 SOB  History:        Patient has prior history of Echocardiogram examinations, most                 recent 01/20/2021. Signs/Symptoms:Chest Pain and Shortness of                 Breath.  Sonographer:    Rolla Etienne Referring Phys: Kathlyn Sacramento, A  Sonographer Comments: Patient position. IMPRESSIONS  1. Left ventricular ejection fraction, by estimation, is 50 to 55%. The left ventricle has low normal function. The left ventricle demonstrates regional wall motion abnormalities (see scoring diagram/findings for description). Left ventricular diastolic  parameters are indeterminate. There is hypokinesis of the left ventricular, apical anterior wall and apical segment.  2. Right ventricular systolic function is normal. The right ventricular size is normal.  3. Left atrial size was moderately dilated.  4. Right atrial size was moderately dilated.  5. The mitral valve is normal in structure. Mild mitral valve regurgitation. No evidence of mitral stenosis.  6. The aortic valve is tricuspid. There is mild calcification of the aortic valve. Aortic valve regurgitation is not visualized. Mild aortic valve stenosis. Aortic valve area, by VTI measures 1.80 cm. Aortic valve mean gradient measures 7.0 mmHg. Aortic valve Vmax measures 1.73 m/s.  7. The inferior vena cava is normal in size with greater than 50% respiratory variability, suggesting  right atrial pressure of 3 mmHg. FINDINGS  Left Ventricle: Left ventricular ejection fraction, by estimation, is 50 to 55%. The left ventricle has low normal function. The left ventricle demonstrates regional wall motion abnormalities. The  left ventricular internal cavity size was normal in size. There is no left ventricular hypertrophy. Left ventricular diastolic parameters are indeterminate. Right Ventricle: The right ventricular size is normal. No increase in right ventricular wall thickness. Right ventricular systolic function is normal. Left Atrium: Left atrial size was moderately dilated. Right Atrium: Right atrial size was moderately dilated. Pericardium: There is no evidence of pericardial effusion. Mitral Valve: The mitral valve is normal in structure. Mild mitral valve regurgitation. No evidence of mitral valve stenosis. Tricuspid Valve: The tricuspid valve is normal in structure. Tricuspid valve regurgitation is trivial. No evidence of tricuspid stenosis. Aortic Valve: The aortic valve is tricuspid. There is mild calcification of the aortic valve. Aortic valve regurgitation is not visualized. Mild aortic stenosis is present. Aortic valve mean gradient measures 7.0 mmHg. Aortic valve peak gradient measures  12.0 mmHg. Aortic valve area, by VTI measures 1.80 cm. Pulmonic Valve: The pulmonic valve was normal in structure. Pulmonic valve regurgitation is not visualized. No evidence of pulmonic stenosis. Aorta: The aortic root is normal in size and structure. Venous: The inferior vena cava is normal in size with greater than 50% respiratory variability, suggesting right atrial pressure of 3 mmHg. IAS/Shunts: No atrial level shunt detected by color flow Doppler.  LEFT VENTRICLE PLAX 2D LVIDd:         5.10 cm     Diastology LVIDs:         3.50 cm     LV e' medial:    9.68 cm/s LV PW:         1.10 cm     LV E/e' medial:  13.5 LV IVS:        1.00 cm     LV e' lateral:   11.10 cm/s LVOT diam:     1.80 cm     LV  E/e' lateral: 11.8 LV SV:         54 LV SV Index:   27 LVOT Area:     2.54 cm  LV Volumes (MOD) LV vol d, MOD A2C: 96.2 ml LV vol d, MOD A4C: 73.3 ml LV vol s, MOD A2C: 58.4 ml LV vol s, MOD A4C: 54.1 ml LV SV MOD A2C:     37.8 ml LV SV MOD A4C:     73.3 ml LV SV MOD BP:      29.7 ml RIGHT VENTRICLE RV Basal diam:  3.60 cm RV Mid diam:    2.80 cm RV S prime:     10.10 cm/s TAPSE (M-mode): 1.8 cm LEFT ATRIUM             Index        RIGHT ATRIUM           Index LA diam:        4.10 cm 2.04 cm/m   RA Area:     20.80 cm LA Vol (A2C):   87.8 ml 43.63 ml/m  RA Volume:   59.20 ml  29.42 ml/m LA Vol (A4C):   70.7 ml 35.13 ml/m LA Biplane Vol: 78.8 ml 39.16 ml/m  AORTIC VALVE AV Area (Vmax):    2.04 cm AV Area (Vmean):   1.87 cm AV Area (VTI):     1.80 cm AV Vmax:           173.00 cm/s AV Vmean:          119.000 cm/s AV VTI:            0.303 m AV Peak  Grad:      12.0 mmHg AV Mean Grad:      7.0 mmHg LVOT Vmax:         139.00 cm/s LVOT Vmean:        87.500 cm/s LVOT VTI:          0.214 m LVOT/AV VTI ratio: 0.71 MITRAL VALVE MV Area (PHT): 4.53 cm     SHUNTS MV Decel Time: 168 msec     Systemic VTI:  0.21 m MV E velocity: 131.00 cm/s  Systemic Diam: 1.80 cm MV A velocity: 65.10 cm/s MV E/A ratio:  2.01 Glori Bickers MD Electronically signed by Glori Bickers MD Signature Date/Time: 11/30/2022/2:43:32 PM    Final    PERIPHERAL VASCULAR CATHETERIZATION  Result Date: 11/30/2022 1.  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 2.  No significant aortoiliac disease. 3.  Right lower extremity: Calcified vessels with mild nonobstructive disease affecting the SFA and popliteal arteries, two-vessel runoff below the knee via the posterior tibial and peroneal artery.  Anterior tibial artery occluded distally and the dorsalis pedis fills retrograde from the pedal arch. Recommendations: The patient has inline flow to the right toes via the posterior tibial artery.  No indication for  revascularization.  Continue medical therapy. Resume Eliquis tomorrow as long as no bleeding issues. 50 mL of contrast was used for the procedure.  The patient did develop shortness of breath and hypoxia with IV fluid hydration and was given 1 dose of IV furosemide before the procedure.  He can be discharged home as long as he is not requiring any supplemental oxygen. Will plan on obtaining renal artery duplex as an outpatient to evaluate left kidney size and flow to make decision regarding left renal artery revascularization    Cardiac Studies    PV Angio 11/30/22 1.  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 2.  No significant aortoiliac disease. 3.  Right lower extremity: Calcified vessels with mild nonobstructive disease affecting the SFA and popliteal arteries, two-vessel runoff below the knee via the posterior tibial and peroneal artery.  Anterior tibial artery occluded distally and the dorsalis pedis fills retrograde from the pedal arch.   Recommendations: The patient has inline flow to the right toes via the posterior tibial artery.  No indication for revascularization.  Continue medical therapy. Resume Eliquis tomorrow as long as no bleeding issues. 50 mL of contrast was used for the procedure.  The patient did develop shortness of breath and hypoxia with IV fluid hydration and was given 1 dose of IV furosemide before the procedure.  He can be discharged home as long as he is not requiring any supplemental oxygen. Will plan on obtaining renal artery duplex as an outpatient to evaluate left kidney size and flow to make decision regarding left renal artery revascularization   2D echo 11/30/22    1. Left ventricular ejection fraction, by estimation, is 50 to 55%. The  left ventricle has low normal function. The left ventricle demonstrates  regional wall motion abnormalities (see scoring diagram/findings for  description). Left ventricular diastolic   parameters are  indeterminate. There is hypokinesis of the left  ventricular, apical anterior wall and apical segment.   2. Right ventricular systolic function is normal. The right ventricular  size is normal.   3. Left atrial size was moderately dilated.   4. Right atrial size was moderately dilated.   5. The mitral valve is normal in structure. Mild mitral valve  regurgitation. No evidence of mitral stenosis.   6. The aortic valve is tricuspid. There is mild calcification of the  aortic valve. Aortic valve regurgitation is not visualized. Mild aortic  valve stenosis. Aortic valve area, by VTI measures 1.80 cm. Aortic valve  mean gradient measures 7.0 mmHg.  Aortic valve Vmax measures 1.73 m/s.   7. The inferior vena cava is normal in size with greater than 50%  respiratory variability, suggesting right atrial pressure of 3 mmHg.     Patient Profile     72 y.o. 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 Previously admitted 2022 for syncope, found to have 3/5 grafts patent and wore a monitor showing atrial fib with rates as low as the 30s (predominantly during sleeping hours, longest pause 4.7 sec) with average HR 60bpm. Though afib felt to be chronic he was felt to be back in NSR in follow-up 10/2022. He was recently seen in the office by Dr. Fletcher Anon for claudication. The patient had his right toenails clipped about 8 weeks ago and also has an ingrown first toenail. He subsequently developed an ulceration on the right second toe that has worsened over the last few weeks, becoming a full wound with dark discoloration followed by podiatry. He was found to have abnormal ABIs and saw Dr. Fletcher Anon who brought him in for PV angiography. This was done yesterday showing subtotally occluded  L renal artery with atrophy of left kidney, RLE with disease outlined above but no indication for revascularization, and no significant aortoiliac disease. Post-procedure he was noted to be hypoxic (new) and volume overloaded in atrial fib so was admitted for further management.   Assessment & Plan    1. Acute respiratory failure with hypoxia suspected due to acute on chronic HFpEF, unclear precipitant - 2D echo with EF 50-55% (similar to prior) + WMA in similar territory as prior, moderate BAE, mild MR, mild AS - per review with Dr. Harrell Gave, presentation felt less likely related to renal artery stenosis - received IV Lasix - 60mg  on 3/20, 40mg  on 3/21 with brisk diuresis -8L out - reviewed 2g sodium restriction, 64 oz fluid restriction, daily weights with patient, will rx CHF booklet - would hold off resuming Actos at DC given contraindication with heart failure - SGLT2i added instead - have ordered repeat O2 assessment witth rest/ambulation - hypokalemia this AM post diuresis of 3.2 - replete with 26meq now then 70meq around 1pm, hold off further diuretic pending d/w MD   2. Recurrent atrial fibrillation/atrial flutter with controlled ventricular response - not requiring any AVN blocking agents (prior brady on monitor in 2022) - monitor shows intermittent afib but also a fairly regular rhythm with very low P wave amplitude - suspect both afib/flutter - patient has no cardiac awareness of arrhythmia - given prior monitor in 2022 in 100% Afib/flutter throughout and diagnosis of chronic afib at that time, question whether 10/2022 EKG truly was sinus - it does appear this way on EKG but he also has episodic fixed rates on telemetry that are regular in the mid 50s - with P wave amplitude intensification, appears to be atrial flutter - TSH OK - will review rhythm plan with MD - continue Eliquis   3. PAD - subtotally occluded L renal  artery with atrophy of L kidney - > getting renal artery duplex  today - RLE showed calcified vessels with mild nonobstructive disease affecting the SFA and popliteal arteries, two-vessel runoff below the knee via the posterior tibial and peroneal artery, anterior tibial artery occluded distally and the dorsalis pedis fills retrograde from the pedal arch -> no indication for revascularization, medical management    4. Mild MR, Mild AS by echo - anticipate OP monitoring with consideration of repeat echo in 3-5 years   5. Hyponatremia - Na 131 corrected to 133 yesterday, repeat 133 this AM; prior values as low as 132 so this is not new   6. CKD stage 2-3a - pre-cath Cr 1.31, post-diuresis 1.49 - hold on further IV Lasix and ARB today   7. Diabetes mellitus - resumed glipizide - anticipate resumption of metformin on 3/23 - Actos changed to SGLT2i given CHF - resume GLP1 after dc   8. HTN - on amlodipine, valsartan, HCTZ QOD as OP - will continue to review regimen with MD   9. CAD s/p remote PCI 2001, CABG in 2003 - on ASA PTA - per Dr. Harrell Gave, resumed with CAD + PAD - not on BB with prior bradycardia - resume rosuvastatin and revisit lipid management as OP, LDL 84   10. OSA on CPAP - adherent with CPAP, ordered in house  Dispo: TBD pending renal duplex, O2 assessment, discussion with MD  The patient had PV follow-up with Isaac Laud scheduled later in April but I also added a TOC f/u appt on 12/13/22 with Diona Browner NP. Kept PV follow-up for now but can be decided at f/u visit.  For questions or updates, please contact [facility] Please consult [email or web address] for contact info under Cardiology/STEMI.  Signed, Charlie Pitter, PA-C 12/02/2022, 8:18 AM

## 2022-12-02 NOTE — Discharge Summary (Addendum)
Discharge Summary    Patient ID: [name] MRN: [id number]; DOB: 07/27/1951  Admit date: 11/30/2022 Discharge date: 12/02/2022  PCP:  Bonnita Hollow, MD   [place] [facility] Cardiologist:  Minus Breeding, MD        Discharge Diagnoses    Principal Problem:   Acute on chronic heart failure with preserved ejection fraction (HFpEF) ([place]) Active Problems:   Obstructive sleep apnea   Type 2 diabetes mellitus with hyperglycemia, without long-term current use of insulin (HCC)   Essential hypertension   Coronary artery disease involving native coronary artery of native heart without angina pectoris   Dyslipidemia   Pulmonary edema   PAF (paroxysmal atrial fibrillation) (HCC)   Paroxysmal atrial flutter (HCC)   Hyponatremia   Chronic kidney disease, stage 2, mildly decreased GFR   Aortic stenosis   Mitral regurgitation   PAD (peripheral artery disease) (HCC)   Left renal artery stenosis (HCC)   Acute hypoxic respiratory failure (HCC)    Diagnostic Studies/Procedures    PV Angio 11/30/22 1.  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 2.  No significant aortoiliac disease. 3.  Right lower extremity: Calcified vessels with mild nonobstructive disease affecting the SFA and popliteal arteries, two-vessel runoff below the knee via the posterior tibial and peroneal artery.  Anterior tibial artery occluded distally and the dorsalis pedis fills retrograde from the pedal arch.   Recommendations: The patient has inline flow to the right toes via the posterior tibial artery.  No indication for revascularization.  Continue medical therapy. Resume Eliquis tomorrow as long as no bleeding issues. 50 mL of contrast was used for the procedure.  The patient did develop shortness of breath and hypoxia with IV fluid hydration and was given 1 dose of IV furosemide before the procedure.  He can be discharged home as long as he is not requiring any  supplemental oxygen. Will plan on obtaining renal artery duplex as an outpatient to evaluate left kidney size and flow to make decision regarding left renal artery revascularization   2D echo 11/30/22  1. Left ventricular ejection fraction, by estimation, is 50 to 55%. The  left ventricle has low normal function. The left ventricle demonstrates  regional wall motion abnormalities (see scoring diagram/findings for  description). Left ventricular diastolic   parameters are indeterminate. There is hypokinesis of the left  ventricular, apical anterior wall and apical segment.   2. Right ventricular systolic function is normal. The right ventricular  size is normal.   3. Left atrial size was moderately dilated.   4. Right atrial size was moderately dilated.   5. The mitral valve is normal in structure. Mild mitral valve  regurgitation. No evidence of mitral stenosis.   6. The aortic valve is tricuspid. There is mild calcification of the  aortic valve. Aortic valve regurgitation is not visualized. Mild aortic  valve stenosis. Aortic valve area, by VTI measures 1.80 cm. Aortic valve  mean gradient measures 7.0 mmHg.  Aortic valve Vmax measures 1.73 m/s.   7. The inferior vena cava is normal in size with greater than 50%  respiratory variability, suggesting right atrial pressure of 3 mmHg.    Renal duplex 12/01/22  Summary:  Renal:    Right: No evidence of right renal artery stenosis. Abnormal right         Resistive Index. Normal size right kidney.  Left:  1-59% stenosis of the left renal artery. Abnormal left  Resisitve Index. Normal size of left kidney. Patent LRA with         mildly elevated velocities without significant stenosis.    *See table(s) above for measurements and observations.    _____________   History of Present Illness     72 y.o. 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 Previously admitted 2022 for syncope, found to have 3/5 grafts patent and wore a monitor showing atrial fib with rates as low as the 30s (predominantly during sleeping hours, longest pause 4.7 sec) with average HR 60bpm. Though afib felt to be chronic he was felt to be back in NSR in follow-up 10/2022.   He was recently seen in the office by Dr. Fletcher Anon for claudication. The patient had his right toenails clipped about 8 weeks ago and also has an ingrown first toenail. He subsequently developed an ulceration on the right second toe that has worsened over the last few weeks, becoming a full wound with dark discoloration followed by podiatry. He was found to have abnormal ABIs and saw Dr. Fletcher Anon who brought him in for PV angiography. This was done 11/30/22 showing subtotally occluded L renal artery with atrophy of left kidney, RLE with disease outlined above but no indication for revascularization, and no significant aortoiliac disease. Post-procedure he was noted to be hypoxic in the 80s and volume overloaded with recurrent atrial fib (rate controlled) so was admitted for further management. He was not on O2 prior to admission and does not have a h/o smoking.  Hospital Course     1. Acute respiratory failure with hypoxia suspected due to acute on chronic HFpEF, unclear precipitant - 2D echo with EF 50-55% (similar to prior) + WMA in similar territory as prior, moderate BAE, mild MR, mild AS - per review with Dr. Harrell Gave, presentation felt less likely related to renal artery stenosis - received IV Lasix 60mg  on 3/20, 40mg  on 3/21 with brisk diuresis, -8.3L net output - reviewed 2g sodium restriction, 64 oz fluid restriction, daily weights with patient, rx'd CHF booklet - would hold off resuming Actos at discharge given contraindication with heart failure,  changed to SGLT2i/Farixga instead - today O2 was 97% RA, 92% with ambulation -> does not require home O2 at discharge - hypokalemia was repleted, would recommend recheck BMET at upcoming visit per MD (scheduled 12/13/22) - we will send in rx for PRN Lasix only for weight gain/SOB   2. Recurrent atrial fibrillation/atrial flutter - not requiring any AVN blocking agents (prior brady on monitor in 2022) - monitor shows intermittent afib but also a fairly regular rhythm with very low P wave amplitude - both afib/flutter present, all rate controlled with some early AM bradycardia - patient has no cardiac awareness of arrhythmia or dizziness - TSH OK - Eliquis able to be resumed post-procedure  - Dr. Harrell Gave does not anticipate acute plans for restoration of sinus rhythm, consider as outpatient   3. PAD - PV angio this admission with subtotally occluded L renal artery with atrophy of L kidney - renal artery duplex preliminary read showed 1-59% L renal artery stenosis with normal size of L kidney, patent LRA with mildly elevated velocities without significant stenosis; right side had abnormal right resistive index but no significant RAS ->  anticipate ongoing PV follow-up - PV angio of RLE showed calcified vessels with mild nonobstructive disease affecting the SFA and popliteal arteries, two-vessel runoff below the knee via the posterior tibial and peroneal artery, anterior tibial artery occluded distally and the dorsalis pedis fills retrograde from the pedal arch -> no indication for revascularization, medical management    4. Mild MR, Mild AS by echo - anticipate OP monitoring with consideration of repeat echo in 3-5 years   5. Hyponatremia - Na 131 corrected to 133 yesterday, repeat 133 this AM; prior values as low as 132 so this is not new - recommend attention to labs on follow-up, recommend PCP f/u if this persists on labwork   6. CKD stage 2-3a - pre-cath Cr 1.31, post-diuresis 1.49 - see  #8 regarding DC meds - would recommend recheck BMET at upcoming visit per MD (scheduled 12/13/22)   7. Diabetes mellitus - resumed glipizide - recommended metformin on 3/23  - Actos changed to SGLT2i given CHF - resume GLP1 at discharge - advised PCP f/u as well   8. HTN - on amlodipine 10mg  daily, valsartan 320mg  daily, HCTZ 12.5mg  every other day as OP - surprisingly, BP was well controlled with ONLY IV Lasix here - resumed amlodipine today - SGLT2 (Farxiga) started - to remain off HCTZ at discharge - to remain off valsartan at discharge but consider resuming based on BP trends and creatinine in follow-up   9. CAD s/p remote PCI 2001, CABG in 2003 - on ASA PTA - per Dr. Harrell Gave, resumed with CAD + PAD - not on BB with prior bradycardia - continue rosuvastatin and revisit lipid management as OP, LDL 84   10. OSA on CPAP - adherent with CPAP, was ordered while admitted as well  Dr. Harrell Gave has seen and examined the patient today and feels he is stable for discharge. The patient had PV follow-up with Almyra Deforest scheduled later in April but he does require sooner f/u  also added a TOC f/u appt on 12/13/22 with Diona Browner NP. We kept PV follow-up on 01/03/23 for now but can be decided at f/u visit.       Did the patient have an acute coronary syndrome (MI, NSTEMI, STEMI, etc) this admission?:  No                               Did the patient have a percutaneous coronary intervention (stent / angioplasty)?:  No.        The patient will be scheduled for a TOC follow up appointment. TOC call also initiated. _____________  Discharge Vitals Blood pressure (!) 146/67, pulse 73, temperature 98.1 F (36.7 C), temperature source Oral, resp. rate 16, height 5\' 9"  (1.753 m), weight 84.1 kg, SpO2 94 %.  Filed Weights   11/30/22 1033 12/02/22 0626  Weight: 85.3 kg 84.1 kg    Labs & Radiologic Studies    CBC Recent Labs    12/01/22 0446 12/02/22 0116  WBC 9.2 8.9  HGB 12.3*  11.9*  HCT 37.5* 35.4*  MCV 87.8 87.0  PLT 196 [id number]   Basic Metabolic Panel Recent Labs    12/01/22 0446 12/02/22 0116  NA 131* 133*  K 3.7 3.2*  CL 97* 101  CO2 25 24  GLUCOSE 201* 100*  BUN 15 22  CREATININE 1.26* 1.49*  CALCIUM 9.0 8.8*  MG  --  2.0   Liver Function Tests No results  for input(s): "AST", "ALT", "ALKPHOS", "BILITOT", "PROT", "ALBUMIN" in the last 72 hours. No results for input(s): "LIPASE", "AMYLASE" in the last 72 hours. High Sensitivity Troponin:   No results for input(s): "TROPONINIHS" in the last 720 hours.  BNP Invalid input(s): "POCBNP" D-Dimer No results for input(s): "DDIMER" in the last 72 hours. Hemoglobin A1C Recent Labs    11/30/22 1816  HGBA1C 8.4*   Fasting Lipid Panel Recent Labs    12/01/22 0446  CHOL 138  HDL 39*  LDLCALC 84  TRIG 74  CHOLHDL 3.5   Thyroid Function Tests Recent Labs    12/01/22 0446  TSH 2.490   _____________  VAS US RENAL ARTERY DUPLEX  Result Date: 12/02/2022 ABDOMINAL VISCERAL Patient Name:  [name]  Date of Exam:   12/02/2022 Medical Rec #: [id number]        Accession #:    [id number] Date of Birth: 29-May-1951        Patient Gender: M Patient Age:   49 years Exam Location:  [facility] Procedure:      VAS US RENAL ARTERY DUPLEX Referring Phys: [id number] My [place] N [place] Fettes -------------------------------------------------------------------------------- Indications: Abdominal Aortogram w/LE on 11/30/22 showed subtotally occluded left              renal artery with atrophy of left kidney. Right renal artery 30%              stenosis. High Risk Factors: Hypertension, hyperlipidemia, Diabetes, coronary artery                    disease. Other Factors: History CABG. Limitations: Obesity. Comparison Study: No priors. Performing Technologist: Oda Cogan RDMS, RVT  Examination Guidelines: A complete evaluation includes B-mode imaging, spectral Doppler, color Doppler, and power Doppler as needed of all accessible  portions of each vessel. Bilateral testing is considered an integral part of a complete examination. Limited examinations for reoccurring indications may be performed as noted.  Duplex Findings: +--------------------+--------+--------+------+--------+ Mesenteric          PSV cm/sEDV cm/sPlaqueComments +--------------------+--------+--------+------+--------+ Aorta Mid              69                          +--------------------+--------+--------+------+--------+ Celiac Artery Origin  135                          +--------------------+--------+--------+------+--------+ SMA Proximal          132                          +--------------------+--------+--------+------+--------+    +------------------+--------+--------+-------+ Right Renal ArteryPSV cm/sEDV cm/sComment +------------------+--------+--------+-------+ Origin               85      17           +------------------+--------+--------+-------+ Proximal             96      19           +------------------+--------+--------+-------+ Mid                  81      18           +------------------+--------+--------+-------+ Distal               78      23           +------------------+--------+--------+-------+ +-----------------+--------+--------+-------+  Left Renal ArteryPSV cm/sEDV cm/sComment +-----------------+--------+--------+-------+ Origin             134      18           +-----------------+--------+--------+-------+ Proximal            98      23           +-----------------+--------+--------+-------+ Mid                102      26           +-----------------+--------+--------+-------+ Distal              98      27           +-----------------+--------+--------+-------+ +------------+--------+--------+----+-----------+--------+--------+----+ Right KidneyPSV cm/sEDV cm/sRI  Left KidneyPSV cm/sEDV cm/sRI    +------------+--------+--------+----+-----------+--------+--------+----+ Upper Pole  42      12      0.73Upper Pole 43      14      0.63 +------------+--------+--------+----+-----------+--------+--------+----+ Mid         48      16      0.67Mid        57      12      0.79 +------------+--------+--------+----+-----------+--------+--------+----+ Lower Pole  71      13      0.82Lower Pole 47      12      0.75 +------------+--------+--------+----+-----------+--------+--------+----+ Hilar       46      12      0.73Hilar      48      12      0.78 +------------+--------+--------+----+-----------+--------+--------+----+ +------------------+-----+------------------+-----+ Right Kidney           Left Kidney             +------------------+-----+------------------+-----+ RAR                    RAR                     +------------------+-----+------------------+-----+ RAR (manual)      1.4  RAR (manual)      1.9   +------------------+-----+------------------+-----+ Cortex                 Cortex                  +------------------+-----+------------------+-----+ Cortex thickness       Corex thickness         +------------------+-----+------------------+-----+ Kidney length (cm)11.85Kidney length (cm)11.41 +------------------+-----+------------------+-----+   Summary: Renal:  Right: No evidence of right renal artery stenosis. Abnormal right        Resistive Index. Normal size right kidney. Left:  1-59% stenosis of the left renal artery. Abnormal left        Resisitve Index. Normal size of left kidney. Patent LRA with        mildly elevated velocities without significant stenosis.  *See table(s) above for measurements and observations.     Preliminary    DG Chest 2 View  Result Date: 12/01/2022 CLINICAL DATA:  Hypoxia EXAM: CHEST - 2 VIEW COMPARISON:  Previous studies including the examination of 05/29/2007 FINDINGS: The cardiac size is within normal limits.  There is previous coronary bypass surgery. Coronary artery stent is noted. There are no signs of pulmonary edema or focal pulmonary consolidation. There is no pleural effusion or pneumothorax. IMPRESSION: There are no signs of pulmonary edema or  new focal infiltrates. Coronary artery disease. Electronically Signed   By: Elmer Picker M.D.   On: 12/01/2022 09:34   ECHOCARDIOGRAM COMPLETE  Result Date: 11/30/2022    ECHOCARDIOGRAM REPORT   Patient Name:   [name] Date of Exam: 11/30/2022 Medical Rec #:  [id number]       Height:       69.0 in Accession #:    [id number]      Weight:       188.0 lb Date of Birth:  May 16, 1951       BSA:          2.012 m Patient Age:    52 years        BP:           145/69 mmHg Patient Gender: M               HR:           73 bpm. Exam Location:  Inpatient Procedure: 2D Echo, Cardiac Doppler and Color Doppler Indications:    R06.02 SOB  History:        Patient has prior history of Echocardiogram examinations, most                 recent 01/20/2021. Signs/Symptoms:Chest Pain and Shortness of                 Breath.  Sonographer:    Rolla Etienne Referring Phys: Kathlyn Sacramento, A  Sonographer Comments: Patient position. IMPRESSIONS  1. Left ventricular ejection fraction, by estimation, is 50 to 55%. The left ventricle has low normal function. The left ventricle demonstrates regional wall motion abnormalities (see scoring diagram/findings for description). Left ventricular diastolic  parameters are indeterminate. There is hypokinesis of the left ventricular, apical anterior wall and apical segment.  2. Right ventricular systolic function is normal. The right ventricular size is normal.  3. Left atrial size was moderately dilated.  4. Right atrial size was moderately dilated.  5. The mitral valve is normal in structure. Mild mitral valve regurgitation. No evidence of mitral stenosis.  6. The aortic valve is tricuspid. There is mild calcification of the aortic valve. Aortic valve  regurgitation is not visualized. Mild aortic valve stenosis. Aortic valve area, by VTI measures 1.80 cm. Aortic valve mean gradient measures 7.0 mmHg. Aortic valve Vmax measures 1.73 m/s.  7. The inferior vena cava is normal in size with greater than 50% respiratory variability, suggesting right atrial pressure of 3 mmHg. FINDINGS  Left Ventricle: Left ventricular ejection fraction, by estimation, is 50 to 55%. The left ventricle has low normal function. The left ventricle demonstrates regional wall motion abnormalities. The left ventricular internal cavity size was normal in size. There is no left ventricular hypertrophy. Left ventricular diastolic parameters are indeterminate. Right Ventricle: The right ventricular size is normal. No increase in right ventricular wall thickness. Right ventricular systolic function is normal. Left Atrium: Left atrial size was moderately dilated. Right Atrium: Right atrial size was moderately dilated. Pericardium: There is no evidence of pericardial effusion. Mitral Valve: The mitral valve is normal in structure. Mild mitral valve regurgitation. No evidence of mitral valve stenosis. Tricuspid Valve: The tricuspid valve is normal in structure. Tricuspid valve regurgitation is trivial. No evidence of tricuspid stenosis. Aortic Valve: The aortic valve is tricuspid. There is mild calcification of the aortic valve. Aortic valve regurgitation is not visualized. Mild aortic stenosis is present. Aortic valve mean gradient measures 7.0 mmHg. Aortic valve peak gradient  measures  12.0 mmHg. Aortic valve area, by VTI measures 1.80 cm. Pulmonic Valve: The pulmonic valve was normal in structure. Pulmonic valve regurgitation is not visualized. No evidence of pulmonic stenosis. Aorta: The aortic root is normal in size and structure. Venous: The inferior vena cava is normal in size with greater than 50% respiratory variability, suggesting right atrial pressure of 3 mmHg. IAS/Shunts: No atrial  level shunt detected by color flow Doppler.  LEFT VENTRICLE PLAX 2D LVIDd:         5.10 cm     Diastology LVIDs:         3.50 cm     LV e' medial:    9.68 cm/s LV PW:         1.10 cm     LV E/e' medial:  13.5 LV IVS:        1.00 cm     LV e' lateral:   11.10 cm/s LVOT diam:     1.80 cm     LV E/e' lateral: 11.8 LV SV:         54 LV SV Index:   27 LVOT Area:     2.54 cm  LV Volumes (MOD) LV vol d, MOD A2C: 96.2 ml LV vol d, MOD A4C: 73.3 ml LV vol s, MOD A2C: 58.4 ml LV vol s, MOD A4C: 54.1 ml LV SV MOD A2C:     37.8 ml LV SV MOD A4C:     73.3 ml LV SV MOD BP:      29.7 ml RIGHT VENTRICLE RV Basal diam:  3.60 cm RV Mid diam:    2.80 cm RV S prime:     10.10 cm/s TAPSE (M-mode): 1.8 cm LEFT ATRIUM             Index        RIGHT ATRIUM           Index LA diam:        4.10 cm 2.04 cm/m   RA Area:     20.80 cm LA Vol (A2C):   87.8 ml 43.63 ml/m  RA Volume:   59.20 ml  29.42 ml/m LA Vol (A4C):   70.7 ml 35.13 ml/m LA Biplane Vol: 78.8 ml 39.16 ml/m  AORTIC VALVE AV Area (Vmax):    2.04 cm AV Area (Vmean):   1.87 cm AV Area (VTI):     1.80 cm AV Vmax:           173.00 cm/s AV Vmean:          119.000 cm/s AV VTI:            0.303 m AV Peak Grad:      12.0 mmHg AV Mean Grad:      7.0 mmHg LVOT Vmax:         139.00 cm/s LVOT Vmean:        87.500 cm/s LVOT VTI:          0.214 m LVOT/AV VTI ratio: 0.71 MITRAL VALVE MV Area (PHT): 4.53 cm     SHUNTS MV Decel Time: 168 msec     Systemic VTI:  0.21 m MV E velocity: 131.00 cm/s  Systemic Diam: 1.80 cm MV A velocity: 65.10 cm/s MV E/A ratio:  2.01 Glori Bickers MD Electronically signed by Glori Bickers MD Signature Date/Time: 11/30/2022/2:43:32 PM    Final    PERIPHERAL VASCULAR CATHETERIZATION  Result Date: 11/30/2022 1.  Subtotally occluded left renal artery with atrophy of  left kidney. 2.  No significant aortoiliac disease. 3.  Right lower extremity: Calcified vessels with mild nonobstructive disease affecting the SFA and popliteal arteries, two-vessel runoff  below the knee via the posterior tibial and peroneal artery.  Anterior tibial artery occluded distally and the dorsalis pedis fills retrograde from the pedal arch. Recommendations: The patient has inline flow to the right toes via the posterior tibial artery.  No indication for revascularization.  Continue medical therapy. Resume Eliquis tomorrow as long as no bleeding issues. 50 mL of contrast was used for the procedure.  The patient did develop shortness of breath and hypoxia with IV fluid hydration and was given 1 dose of IV furosemide before the procedure.  He can be discharged home as long as he is not requiring any supplemental oxygen. Will plan on obtaining renal artery duplex as an outpatient to evaluate left kidney size and flow to make decision regarding left renal artery revascularization   DG Foot 2 Views Right  Result Date: 11/13/2022 CLINICAL DATA:  Right foot wound. EXAM: RIGHT FOOT - 2 VIEW COMPARISON:  None Available. FINDINGS: Apparent bandages around the base of the great toe, at presumed site of wound. No foreign body, soft tissue emphysema or bone destruction identified. The bones are mildly demineralized. There is no evidence of acute fracture or dislocation. Prominent vascular calcifications are noted. There are vascular clips in the distal lower leg. IMPRESSION: No radiographic evidence of osteomyelitis. Prominent vascular calcifications. Electronically Signed   By: Richardean Sale M.D.   On: 11/13/2022 10:41   US ARTERIAL ABI (SCREENING LOWER EXTREMITY)  Result Date: 11/09/2022 CLINICAL DATA:  72 year old male with leg pain. EXAM: NONINVASIVE PHYSIOLOGIC VASCULAR STUDY OF BILATERAL LOWER EXTREMITIES TECHNIQUE: Evaluation of both lower extremities were performed at rest, including calculation of ankle-brachial indices with single level Doppler, pressure and pulse volume recording. COMPARISON:  None Available. FINDINGS: Right ABI:  Noncompressible. Left ABI:  Noncompressible. Right  Lower Extremity:  Monophasic arterial waveforms at the ankle. Left Lower Extremity:  Monophasic arterial waveforms at the ankle. IMPRESSION: Nondiagnostic ankle brachial indices due to noncompressible arteries at the level of the ankle. Lower extremity arterial duplex, CTA runoff, or direct lower extremity angiography could be considered for further characterization as clinically indicated. Ruthann Cancer, MD [facility] Electronically Signed   By: Ruthann Cancer M.D.   On: 11/09/2022 09:46   Disposition   Pt is being discharged home today in good condition.  Follow-up Plans & Appointments     Follow-up Information     Lenna Sciara, NP Follow up.   Specialties: Cardiology, Family Medicine Why: [facility] location - cardiology follow-up has been arranged on Tuesday Dec 13, 2022 at 8:50 AM (Arrive by 8:35 AM). Raquel Sarna is one of our nurse practitioners with Dr. Rosezella [place] team. Contact information: [address] [place] [address] [place] [address] [phone]                Discharge Instructions     Diet - low sodium heart healthy   Complete by: As directed    Discharge instructions   Complete by: As directed    IMPORTANT REGARDING YOUR METFORMIN: After an angiogram, you will need to avoid taking metformin for at least 48 hours. We would recommend you restart this on Saturday 12/03/22.  We stopped your Actos/pioglitazone due to the diagnosis of congestive heart failure. We have switched this to another medicine called Farxiga/dapagliflozin. Please notify your doctor if  you develop any symptoms of urinary tract or yeast infection. Please follow up with your primary care provider for diabetes management given this medicine change.  You were given a prescription for as-needed Lasix/furosemide to take in the event of 3-5 pound weight gain or increased shortness of breath.  We have stopped your hydrochlorothiazide  and valsartan as well. Do not throw away the valsartan as we may restart this at your follow-up appointment based on bloodwork and blood pressure. Please check your blood pressure at home at least 3 hours after AM medication and bring a log to your visit.  For patients with congestive heart failure, we give them these special instructions:  1. Follow a low-salt diet - you should be eating no more than 2,000mg  of sodium per day. This does not necessarily just apply to the salt you put on top of prepared food, but the sodium already in food. Processed food, frozen meals, canned goods, deli meat, and bread can have a surprising amount of sodium per serving so be sure to track this daily. 2. Watch your fluid intake. In general, you should not be taking in more than 2 liters of fluid per day (close to 64 oz of fluid per day). This includes sources of water in foods like soup, coffee, tea, milk, etc. It's important to stay hydrated but NOT to excess. 2. Weigh yourself on the same scale at same time of day and keep a log. 3. Call your doctor: (Anytime you feel any of the following symptoms)  - 3lb weight gain overnight or 5lb within a few days - Shortness of breath, with or without a dry hacking cough  - Swelling in the hands, feet or stomach  - If you have to sleep on extra pillows at night in order to breathe   IT IS IMPORTANT TO LET YOUR DOCTOR KNOW EARLY ON IF YOU ARE HAVING SYMPTOMS SO WE CAN HELP YOU!   Discharge wound care:   Complete by: As directed    Keep procedure site clean & dry. If you notice increased pain, swelling, bleeding or pus, call/return!  You may shower, but no soaking baths/hot tubs/pools for 1 week.   Increase activity slowly   Complete by: As directed    No driving for 1 day. No lifting over 5 lbs for 1 week. No sexual activity for 1 week. Keep procedure site clean & dry. If you notice increased pain, swelling, bleeding or pus, call/return!  You may shower, but no soaking  baths/hot tubs/pools for 1 week.        Discharge Medications   Allergies as of 12/02/2022   No Known Allergies      Medication List     STOP taking these medications    hydrochlorothiazide 12.5 MG tablet Commonly known as: HYDRODIURIL   pioglitazone 15 MG tablet Commonly known as: ACTOS   valsartan 320 MG tablet Commonly known as: DIOVAN       TAKE these medications    amLODipine 10 MG tablet Commonly known as: NORVASC Take 10 mg by mouth daily.   apixaban 5 MG Tabs tablet Commonly known as: ELIQUIS Take 1 tablet (5 mg total) by mouth 2 (two) times daily.   aspirin EC 81 MG tablet Take 1 tablet (81 mg total) by mouth daily. Swallow whole.   dapagliflozin propanediol 10 MG Tabs tablet Commonly known as: FARXIGA Take 1 tablet (10 mg total) by mouth daily. Start taking on: December 03, 2022   ergocalciferol 1.25 MG (  50000 UT) capsule Commonly known as: VITAMIN D2 Take 50,000 Units by mouth every Saturday.   furosemide 20 MG tablet Commonly known as: Lasix Take 1 tablet (20 mg total) by mouth daily as needed (weight gain of 3-5lb or increased shortness of breath).   glipiZIDE 10 MG tablet Commonly known as: GLUCOTROL Take 10 mg by mouth 2 (two) times daily before a meal.   metFORMIN 500 MG 24 hr tablet Commonly known as: GLUCOPHAGE-XR Take 500 mg by mouth 2 (two) times daily with a meal. Notes to patient: IMPORTANT: You can restart this on Saturday 12/03/22.   rosuvastatin 10 MG tablet Commonly known as: CRESTOR Take 10 mg by mouth See admin instructions. Takes 10 mg on Mondays and Fridays   triamcinolone cream 0.1 % Commonly known as: KENALOG Apply 1 Application topically 2 (two) times daily as needed (dry skin).   Trulicity 3 0000000 Sopn Generic drug: Dulaglutide Inject 3 mg into the skin every Saturday.               Discharge Care Instructions  (From admission, onward)           Start     Ordered   12/02/22 0000  Discharge  wound care:       Comments: Keep procedure site clean & dry. If you notice increased pain, swelling, bleeding or pus, call/return!  You may shower, but no soaking baths/hot tubs/pools for 1 week.   12/02/22 1300               Outstanding Labs/Studies   Recommend BMET @ f/u  Duration of Discharge Encounter   Greater than 30 minutes including physician time.  Signed, Charlie Pitter, PA-C 12/02/2022, 1:13 PM

## 2022-12-02 NOTE — Progress Notes (Signed)
Pt refused bipap at this time

## 2022-12-02 NOTE — Progress Notes (Signed)
Nurse requested Mobility Specialist to perform oxygen saturation test with pt which includes removing pt from oxygen both at rest and while ambulating.  Below are the results from that testing.     Patient Saturations on Room Air at Rest = spO2 97%  Patient Saturations on Room Air while Ambulating = sp02 92% .    Reported results to nurse.   

## 2022-12-02 NOTE — Progress Notes (Signed)
Patient in bed watching T.V. with no complaints. H.R. dropped to 39 At. Fib.  For a few beats then back up to the 60's Cont. To monitor patient and rhythm

## 2022-12-02 NOTE — TOC Transition Note (Signed)
Transition of Care (TOC) - CM/SW Discharge Note Marvetta Gibbons RN, BSN Transitions of Care Unit 4E- RN Case Manager See Treatment Team for direct phone #   Patient Details  Name: [name] MRN: [id number] Date of Birth: [birth year]  Transition of Care [facility]) CM/SW Contact:  Dawayne Patricia, RN Phone Number: 12/02/2022, 1:23 PM   Clinical Narrative:    Pt stable for transition home today, noted repeat ambulatory 02 check done and pt did not need 02 for home- qualifications not met.   Pt home w/ spouse- family to transport home.   No further TOC needs noted. Pt to follow AVS instructions.    Final next level of care: Home/Self Care Barriers to Discharge: No Barriers Identified   Patient Goals and CMS Choice CMS Medicare.gov Compare Post Acute Care list provided to:: Patient Choice offered to / list presented to : NA  Discharge Placement                 Home        Discharge Plan and Services Additional resources added to the After Visit Summary for       Post Acute Care Choice: NA          DME Arranged: N/A DME Agency: NA       HH Arranged: NA HH Agency: NA        Social Determinants of Health (SDOH) Interventions SDOH Screenings   Tobacco Use: High Risk (12/01/2022)     Readmission Risk Interventions    12/02/2022    1:23 PM  Readmission Risk Prevention Plan  Post Dischage Appt Complete  Medication Screening Complete  Transportation Screening Complete

## 2022-12-02 NOTE — Telephone Encounter (Addendum)
   Attention TOC pool,  This patient will need a TOC phone call after discharge. They are being discharged today. Came in for PV procedure but found to have worsening heart failure requiring diuresis.  Follow-up appointment has already been arranged with: Diona Browner on Tuesday Dec 13, 2022 8:50 AM (Arrive by 8:35 AM)  They are a patient of Minus Breeding, MD.  Thank you! Charlie Pitter, PA-C

## 2022-12-05 ENCOUNTER — Encounter: Payer: Self-pay | Admitting: Podiatry

## 2022-12-05 ENCOUNTER — Ambulatory Visit (INDEPENDENT_AMBULATORY_CARE_PROVIDER_SITE_OTHER): Payer: Medicare Other | Admitting: Podiatry

## 2022-12-05 DIAGNOSIS — B351 Tinea unguium: Secondary | ICD-10-CM | POA: Diagnosis not present

## 2022-12-05 DIAGNOSIS — M79675 Pain in left toe(s): Secondary | ICD-10-CM

## 2022-12-05 DIAGNOSIS — L97512 Non-pressure chronic ulcer of other part of right foot with fat layer exposed: Secondary | ICD-10-CM

## 2022-12-05 DIAGNOSIS — E1165 Type 2 diabetes mellitus with hyperglycemia: Secondary | ICD-10-CM | POA: Diagnosis not present

## 2022-12-05 DIAGNOSIS — M79674 Pain in right toe(s): Secondary | ICD-10-CM | POA: Diagnosis not present

## 2022-12-05 DIAGNOSIS — E08621 Diabetes mellitus due to underlying condition with foot ulcer: Secondary | ICD-10-CM | POA: Diagnosis not present

## 2022-12-05 NOTE — Progress Notes (Signed)
Subjective:  Patient ID: [name], male    DOB: [birth year],   MRN: [id number]  Chief Complaint  Patient presents with   Foot Ulcer    Right foot wound check     72 y.o. male presents for follow-up of right second toe wound and ingrown right great toe. Underwent angio and optimized. Marland KitchenHe has been keeping betadine on the toe. Relates the ingrown toenail area is doing well and not bother him. Is requesting to have his other nails trimmed today.    Denies any other pedal complaints. Denies n/v/f/c.    Patient is diabetic and last A1c was  Lab Results  Component Value Date   HGBA1C 8.4 (H) 11/30/2022   .   PCP:  Bonnita Hollow, MD     Past Medical History:  Diagnosis Date   A-fib [facility])    CAD (coronary artery disease)    Hypertension    Obstructive sleep apnea    CPAP   Rosacea    Type 2 diabetes mellitus with hyperglycemia (HCC)     Objective:  Physical Exam: Vascular: DP/PT pulses 2/4 bilateral. CFT <3 seconds. Normal hair growth on digits. No edema.  Skin. No lacerations or abrasions bilateral feet. Right second digit dorsal wound with fibrosis and eschar overlying with surrounding erythema. No purulence noted. No probe to bone noted. Minimal erythema surrounding toe and much improved from prior. Wound measures 5 cm x 1 cm x 0.2 cm upon debridement. Right great toe lateral border nail incurvated with no erythema edema or purulence noted.  Musculoskeletal: MMT 5/5 bilateral lower extremities in DF, PF, Inversion and Eversion. Deceased ROM in DF of ankle joint.  Neurological: Sensation intact to light touch.   ABIs:  IMPRESSION: Nondiagnostic ankle brachial indices due to noncompressible arteries at the level of the ankle. Lower extremity arterial duplex, CTA runoff, or direct lower extremity angiography could be considered for further characterization as clinically indicated.    Assessment:   1. Diabetic ulcer of toe of right foot associated with diabetes  mellitus due to underlying condition, with fat layer exposed ([place])   2. Pain due to onychomycosis of toenails of both feet   3. Type 2 diabetes mellitus with hyperglycemia, without long-term current use of insulin ([place])        Plan:  Patient was evaluated and treated and all questions answered. Ulcer right second digit with unstageable ulcer with cellulitis  -Debridement as below  -X-rays reviewed No obvious osseous erosions noted.  -Dressed with betadine, DSD. -Off-loading with surgical shoe. -No abx indicated today.  -ABIs reviewed and abnormal. Underwent angio  Recommendations: The patient has inline flow to the right toes via the posterior tibial artery.  No indication for revascularization.  Continue medical therapy. -Discussed glucose control and proper protein-rich diet.  -Discussed if any worsening redness, pain, fever or chills to call or may need to report to the emergency room. Patient expressed understanding.  -Discussed and educated patient on diabetic foot care, especially with  regards to the vascular, neurological and musculoskeletal systems.  -Stressed the importance of good glycemic control and the detriment of not  controlling glucose levels in relation to the foot. -Discussed supportive shoes at all times and checking feet regularly.  -Mechanically debrided all nails 1-5 bilateral using sterile nail nipper and filed with dremel without incident     Procedure: Excisional Debridement of Wound Rationale: Removal of non-viable soft tissue from the wound to promote healing.  Anesthesia: none Pre-Debridement Wound Measurements:  Overlying necrotic tissue  Post-Debridement Wound Measurements: 5 cm x 1 cm x 0.2 cm  Type of Debridement: Sharp Excisional Tissue Removed: Non-viable soft tissue Depth of Debridement: subcutaneous tissue. Technique: Sharp excisional debridement to bleeding, viable wound base.  Dressing: Dry, sterile, compression dressing. Disposition:  Patient tolerated procedure well. Patient to return in 2 week for follow-up.  Return in about 2 weeks (around 12/19/2022) for wound check.    Return in about 2 weeks (around 12/19/2022) for wound check.   Lorenda Peck, DPM

## 2022-12-05 NOTE — Telephone Encounter (Signed)
Patient contacted regarding discharge from [facility] on 03/23.  Patient understands to follow up with provider Diona Browner, NP on 12/13/2022 at 8:50 AM at [facility]. Patient understands discharge instructions? Yes Patient understands medications and regiment? Yes Patient understands to bring all medications to this visit? Yes

## 2022-12-07 ENCOUNTER — Encounter: Payer: Self-pay | Admitting: Nurse Practitioner

## 2022-12-13 ENCOUNTER — Encounter: Payer: Self-pay | Admitting: Nurse Practitioner

## 2022-12-13 ENCOUNTER — Ambulatory Visit: Payer: Medicare Other | Attending: Nurse Practitioner | Admitting: Nurse Practitioner

## 2022-12-13 VITALS — BP 114/52 | HR 59 | Ht 69.0 in | Wt 188.8 lb

## 2022-12-13 DIAGNOSIS — N183 Chronic kidney disease, stage 3 unspecified: Secondary | ICD-10-CM

## 2022-12-13 DIAGNOSIS — I340 Nonrheumatic mitral (valve) insufficiency: Secondary | ICD-10-CM | POA: Diagnosis not present

## 2022-12-13 DIAGNOSIS — G4733 Obstructive sleep apnea (adult) (pediatric): Secondary | ICD-10-CM | POA: Diagnosis not present

## 2022-12-13 DIAGNOSIS — I4819 Other persistent atrial fibrillation: Secondary | ICD-10-CM | POA: Diagnosis not present

## 2022-12-13 DIAGNOSIS — I739 Peripheral vascular disease, unspecified: Secondary | ICD-10-CM | POA: Diagnosis not present

## 2022-12-13 DIAGNOSIS — E871 Hypo-osmolality and hyponatremia: Secondary | ICD-10-CM

## 2022-12-13 DIAGNOSIS — I350 Nonrheumatic aortic (valve) stenosis: Secondary | ICD-10-CM | POA: Diagnosis not present

## 2022-12-13 DIAGNOSIS — I2510 Atherosclerotic heart disease of native coronary artery without angina pectoris: Secondary | ICD-10-CM | POA: Diagnosis not present

## 2022-12-13 DIAGNOSIS — I5032 Chronic diastolic (congestive) heart failure: Secondary | ICD-10-CM

## 2022-12-13 DIAGNOSIS — E118 Type 2 diabetes mellitus with unspecified complications: Secondary | ICD-10-CM

## 2022-12-13 DIAGNOSIS — R001 Bradycardia, unspecified: Secondary | ICD-10-CM | POA: Diagnosis not present

## 2022-12-13 DIAGNOSIS — I10 Essential (primary) hypertension: Secondary | ICD-10-CM

## 2022-12-13 NOTE — Patient Instructions (Addendum)
Medication Instructions:  Your physician recommends that you continue on your current medications as directed. Please refer to the Current Medication list given to you today.   *If you need a refill on your cardiac medications before your next appointment, please call your pharmacy*   Lab Work: Your physician recommends that you have for lab work today:  BMET   If you have labs (blood work) drawn today and your tests are completely normal, you will receive your results only by: MyChart Message (if you have MyChart) OR A paper copy in the mail If you have any lab test that is abnormal or we need to change your treatment, we will call you to review the results.   Testing/Procedures: None ordered   Follow-Up: At [facility], you and your health needs are our priority.  As part of our continuing mission to provide you with exceptional heart care, we have created designated Provider Care Teams.  These Care Teams include your primary Cardiologist (physician) and Advanced Practice Providers (APPs -  Physician Assistants and Nurse Practitioners) who all work together to provide you with the care you need, when you need it.  We recommend signing up for the patient portal called "MyChart".  Sign up information is provided on this After Visit Summary.  MyChart is used to connect with patients for Virtual Visits (Telemedicine).  Patients are able to view lab/test results, encounter notes, upcoming appointments, etc.  Non-urgent messages can be sent to your provider as well.   To learn more about what you can do with MyChart, go to [email or web address].    Your next appointment:    Keep Follow-up with Almyra Deforest 10 month(s)  Provider:   Minus Breeding, MD

## 2022-12-13 NOTE — Progress Notes (Signed)
Office Visit    Patient Name: [name] Date of Encounter: 12/13/2022  Primary Care Provider:  Bonnita Hollow, MD Primary Cardiologist:  Minus Breeding, MD  Chief Complaint    72 year old male with a history of paroxysmal atrial fibrillation/atrial flutter, prior nocturnal bradycardia, CAD s/p remote PCI in 2001, s/p CABG x 5 in 2003, ICM, chronic diastolic heart failure, mild mitral valve regurgitation, mild aortic stenosis, hypertension, hyperlipidemia, PAD, CKD stage II-III by labs, type 2 diabetes, and OSA who presents for hospital follow-up related to heart failure and atrial fibrillation.  Past Medical History    Past Medical History:  Diagnosis Date   A-fib    CAD (coronary artery disease)    Hypertension    Obstructive sleep apnea    CPAP   Rosacea    Type 2 diabetes mellitus with hyperglycemia    Past Surgical History:  Procedure Laterality Date   ABDOMINAL AORTOGRAM W/LOWER EXTREMITY Right 11/30/2022   Procedure: ABDOMINAL AORTOGRAM W/LOWER EXTREMITY;  Surgeon: Wellington Hampshire, MD;  Location: [facility];  Service: Cardiovascular;  Laterality: Right;   CORONARY ARTERY BYPASS GRAFT     LIMA to the LAD, sequential SVG to ramus intermediate and OM1, sequential SVG to PDA and posterior lateral   RIGHT/LEFT HEART CATH AND CORONARY/GRAFT ANGIOGRAPHY N/A 01/21/2021   Procedure: RIGHT/LEFT HEART CATH AND CORONARY/GRAFT ANGIOGRAPHY;  Surgeon: Lorretta Harp, MD;  Location: [facility];  Service: Cardiovascular;  Laterality: N/A;    Allergies  No Known Allergies   Labs/Other Studies Reviewed    The following studies were reviewed today: PV Angio 11/30/22 1.  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 2.  No significant aortoiliac disease. 3.  Right lower extremity: Calcified vessels with mild nonobstructive disease affecting the SFA and popliteal arteries, two-vessel runoff below the knee via the posterior tibial and  peroneal artery.  Anterior tibial artery occluded distally and the dorsalis pedis fills retrograde from the pedal arch.   Recommendations: The patient has inline flow to the right toes via the posterior tibial artery.  No indication for revascularization.  Continue medical therapy. Resume Eliquis tomorrow as long as no bleeding issues. 50 mL of contrast was used for the procedure.  The patient did develop shortness of breath and hypoxia with IV fluid hydration and was given 1 dose of IV furosemide before the procedure.  He can be discharged home as long as he is not requiring any supplemental oxygen. Will plan on obtaining renal artery duplex as an outpatient to evaluate left kidney size and flow to make decision regarding left renal artery revascularization   2D echo 11/30/22  1. Left ventricular ejection fraction, by estimation, is 50 to 55%. The  left ventricle has low normal function. The left ventricle demonstrates  regional wall motion abnormalities (see scoring diagram/findings for  description). Left ventricular diastolic   parameters are indeterminate. There is hypokinesis of the left  ventricular, apical anterior wall and apical segment.   2. Right ventricular systolic function is normal. The right ventricular  size is normal.   3. Left atrial size was moderately dilated.   4. Right atrial size was moderately dilated.   5. The mitral valve is normal in structure. Mild mitral valve  regurgitation. No evidence of mitral stenosis.   6. The aortic valve is tricuspid. There is mild calcification of the  aortic valve. Aortic valve regurgitation is not visualized. Mild aortic  valve stenosis. Aortic valve  area, by VTI measures 1.80 cm. Aortic valve  mean gradient measures 7.0 mmHg.  Aortic valve Vmax measures 1.73 m/s.   7. The inferior vena cava is normal in size with greater than 50%  respiratory variability, suggesting right atrial pressure of 3 mmHg.    Renal duplex  12/01/22  Summary:  Renal:    Right: No evidence of right renal artery stenosis. Abnormal right         Resistive Index. Normal size right kidney.  Left:  1-59% stenosis of the left renal artery. Abnormal left         Resisitve Index. Normal size of left kidney. Patent LRA with         mildly elevated velocities without significant stenosis.    *See table(s) above for measurements and observations.   Recent Labs: 12/01/2022: TSH 2.490 12/02/2022: BUN 22; Creatinine, Ser 1.49; Hemoglobin 11.9; Magnesium 2.0; Platelets 203; Potassium 3.2; Sodium 133  Recent Lipid Panel    Component Value Date/Time   CHOL 138 12/01/2022 0446   TRIG 74 12/01/2022 0446   HDL 39 (L) 12/01/2022 0446   CHOLHDL 3.5 12/01/2022 0446   VLDL 15 12/01/2022 0446   LDLCALC 84 12/01/2022 0446    History of Present Illness    72 year old male with the above past medical history including paroxysmal atrial fibrillation/atrial flutter, prior nocturnal bradycardia, CAD s/p remote PCI in 2001, s/p CABG x 5 in 2003, ICM, chronic diastolic heart failure, mild mitral valve regurgitation, mild aortic stenosis, hypertension, hyperlipidemia, PAD, CKD stage II 3 by labs, type 2 diabetes, and OSA.  He was previously admitted in 2022 for syncope.  Cardiac catheterization at the time showed 3 out of 5 patent stents, medical management was advised.  Cardiac monitor showed atrial fibrillation with heart rate as low as the 30s bpm (predominantly during sleeping hours, longest pause 4.7 seconds) with average HR 60 bpm.  He was last seen in the office on 10/26/2022 by Dr. Percival Spanish and was stable from a cardiac standpoint.  He noted a nonhealing wound to his R second toe.  ABIs were abnormal. He saw Dr. Fletcher Anon on 11/22/2022  and underwent PAD angiography on 11/30/2022 which showed subtotally occluded left renal artery with atrophy and left kidney, right lower extremity disease as outlined above but no indication for revascularization, no  significant aortoiliac disease.  Postprocedure he was noted to be hypoxic in the 80s related with recurrent atrial fibrillation (rate controlled).  Therefore, hospitalization was recommended for further management.  Repeat echocardiogram showed EF 50 to 55% (similar to prior), WMA in same territory as prior, moderate BAE, mild MR, mild AS.  He was diuresed with IV Lasix.  Labs revealed hypokalemia as well as chronic hyponatremia.  Telemetry monitoring showed intermittent rate controlled atrial fibrillation.  HCTZ and valsartan were held in the setting of hypotension.  He was discharged home in stable condition on 12/02/2022 on amlodipine, Farxiga, and as needed Lasix.  He presents today for follow-up. Since his hospitalization he has been stable from a cardiac standpoint.  He denies any symptoms concerning for angina, denies dyspnea, edema, PND, orthopnea, weight gain.  He is in atrial flutter, rate is slow, he is asymptomatic.  He denies any dizziness, presyncope, syncope, chest pain, dyspnea, edema, PND, orthopnea, weight gain, or worsening claudication.  Overall, he reports feeling well.    Home Medications    Current Outpatient Medications  Medication Sig Dispense Refill   amLODipine (NORVASC) 10 MG tablet Take 10 mg by  mouth daily.     apixaban (ELIQUIS) 5 MG TABS tablet Take 1 tablet (5 mg total) by mouth 2 (two) times daily. 60 tablet 5   aspirin EC 81 MG tablet Take 1 tablet (81 mg total) by mouth daily. Swallow whole. 90 tablet 3   dapagliflozin propanediol (FARXIGA) 10 MG TABS tablet Take 1 tablet (10 mg total) by mouth daily. 30 tablet 5   Dulaglutide (TRULICITY) 3 0000000 SOPN Inject 3 mg into the skin every Saturday.     ergocalciferol (VITAMIN D2) 1.25 MG (50000 UT) capsule Take 50,000 Units by mouth every Saturday.     glipiZIDE (GLUCOTROL) 10 MG tablet Take 10 mg by mouth 2 (two) times daily before a meal.     metFORMIN (GLUCOPHAGE-XR) 500 MG 24 hr tablet Take 500 mg by mouth 2 (two)  times daily with a meal.     rosuvastatin (CRESTOR) 10 MG tablet Take 10 mg by mouth See admin instructions. Takes 10 mg on Mondays and Fridays     furosemide (LASIX) 20 MG tablet Take 1 tablet (20 mg total) by mouth daily as needed (weight gain of 3-5lb or increased shortness of breath). (Patient not taking: Reported on 12/13/2022) 30 tablet 1   triamcinolone cream (KENALOG) 0.1 % Apply 1 Application topically 2 (two) times daily as needed (dry skin). (Patient not taking: Reported on 12/13/2022)     No current facility-administered medications for this visit.     Review of Systems    He denies chest pain, palpitations, dyspnea, pnd, orthopnea, n, v, dizziness, syncope, edema, weight gain, or early satiety. All other systems reviewed and are otherwise negative except as noted above.   Physical Exam    VS:  BP (!) 114/52 (BP Location: Left Arm, Patient Position: Sitting, Cuff Size: Normal)   Pulse (!) 59   Ht 5\' 9"  (1.753 m)   Wt 188 lb 12.8 oz (85.6 kg)   SpO2 98%   BMI 27.88 kg/m  GEN: Well nourished, well developed, in no acute distress. HEENT: normal. Neck: Supple, no JVD, carotid bruits, or masses. Cardiac: IRIR, no murmurs, rubs, or gallops. No clubbing, cyanosis, edema.  Radials/DP/PT 2+ and equal bilaterally.  Respiratory:  Respirations regular and unlabored, clear to auscultation bilaterally. GI: Soft, nontender, nondistended, BS + x 4. MS: no deformity or atrophy. Skin: warm and dry, no rash. Neuro:  Strength and sensation are intact. Psych: Normal affect.  Accessory Clinical Findings    ECG personally reviewed by me today - atrial flutter, 46 bpm - no acute changes.   Lab Results  Component Value Date   WBC 8.9 12/02/2022   HGB 11.9 (L) 12/02/2022   HCT 35.4 (L) 12/02/2022   MCV 87.0 12/02/2022   PLT 203 12/02/2022   Lab Results  Component Value Date   CREATININE 1.49 (H) 12/02/2022   BUN 22 12/02/2022   NA 133 (L) 12/02/2022   K 3.2 (L) 12/02/2022   CL 101  12/02/2022   CO2 24 12/02/2022   Lab Results  Component Value Date   ALT 19 01/19/2021   AST 17 01/19/2021   ALKPHOS 91 01/19/2021   BILITOT 1.2 01/19/2021   Lab Results  Component Value Date   CHOL 138 12/01/2022   HDL 39 (L) 12/01/2022   LDLCALC 84 12/01/2022   TRIG 74 12/01/2022   CHOLHDL 3.5 12/01/2022    Lab Results  Component Value Date   HGBA1C 8.4 (H) 11/30/2022    Assessment & Plan  1. CAD: S/p remote PCI in 2001, s/p CABG x 5 in 2003. Stable with no anginal symptoms. No indication for ischemic evaluation.  Continue aspirin, amlodipine, Crestor.  2. Chronic diastolic heart failure/ICM: Most recent echo in 11/2022 showed EF 50 to 55% (similar to prior), WMA in same territory as prior, moderate BAE, mild MR, mild AS. Euvolemic and well compensated on exam.  Continue Lasix, Farxiga.  3. Persistent atrial fibrillation/atrial flutter/bradycardia:  Rate controlled.  Not on any rate controlling meds due to resting bradycardia.  Continue Eliquis.  4. Hypertension: BP well controlled. Continue current antihypertensive regimen.   5. 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  Denies worsening claudication.  Follow-up with PV APP as scheduled.  Continue aspirin, Crestor.  6. Valvular heart disease: Most recent echo as above.  Asymptomatic.  Consider repeat echo in 1 year.  7. Hyponatremia: Will repeat BMET today.  8. CKD stage II-III: Creatinine was 1.49 on 12/02/2022.  Repeat BMET pending.    9. Type 2 diabetes: A1c was 8.4 in 11/2022.  Monitored and managed per PCP.  10. OSA: Adherent to CPAP.  11. Disposition: Follow-up as scheduled with Almyra Deforest, PV APP. Follow-up in 10 months with Dr. Percival Spanish.      Lenna Sciara, NP 12/13/2022, 12:51 PM

## 2022-12-14 LAB — BASIC METABOLIC PANEL
BUN/Creatinine Ratio: 10 (ref 10–24)
BUN: 14 mg/dL (ref 8–27)
CO2: 22 mmol/L (ref 20–29)
Calcium: 10 mg/dL (ref 8.6–10.2)
Chloride: 98 mmol/L (ref 96–106)
Creatinine, Ser: 1.4 mg/dL — ABNORMAL HIGH (ref 0.76–1.27)
Glucose: 303 mg/dL — ABNORMAL HIGH (ref 70–99)
Potassium: 5 mmol/L (ref 3.5–5.2)
Sodium: 138 mmol/L (ref 134–144)
eGFR: 54 mL/min/{1.73_m2} — ABNORMAL LOW (ref >59)

## 2022-12-19 ENCOUNTER — Ambulatory Visit (INDEPENDENT_AMBULATORY_CARE_PROVIDER_SITE_OTHER): Payer: Medicare Other | Admitting: Podiatry

## 2022-12-19 ENCOUNTER — Encounter: Payer: Self-pay | Admitting: Podiatry

## 2022-12-19 DIAGNOSIS — E113313 Type 2 diabetes mellitus with moderate nonproliferative diabetic retinopathy with macular edema, bilateral: Secondary | ICD-10-CM | POA: Diagnosis not present

## 2022-12-19 DIAGNOSIS — L97512 Non-pressure chronic ulcer of other part of right foot with fat layer exposed: Secondary | ICD-10-CM | POA: Diagnosis not present

## 2022-12-19 DIAGNOSIS — H43823 Vitreomacular adhesion, bilateral: Secondary | ICD-10-CM | POA: Diagnosis not present

## 2022-12-19 DIAGNOSIS — H3582 Retinal ischemia: Secondary | ICD-10-CM | POA: Diagnosis not present

## 2022-12-19 DIAGNOSIS — E08621 Diabetes mellitus due to underlying condition with foot ulcer: Secondary | ICD-10-CM

## 2022-12-19 DIAGNOSIS — H35033 Hypertensive retinopathy, bilateral: Secondary | ICD-10-CM | POA: Diagnosis not present

## 2022-12-19 DIAGNOSIS — E1165 Type 2 diabetes mellitus with hyperglycemia: Secondary | ICD-10-CM

## 2022-12-19 NOTE — Progress Notes (Signed)
Subjective:  Patient ID: [name], male    DOB: 28-Jul-1951,   MRN: [id number]  Chief Complaint  Patient presents with   Diabetic Ulcer    Patient came in today for diabetic ulcer right foot 2nd toe, patient denies any N/V/F/C/SOB, rate of pain 2 out of 10, no drainage,     72 y.o. male presents for follow-up of right second toe wound and ingrown right great toe. Underwent angio and optimized. Marland KitchenHe has been keeping betadine on the toe. Relates the ingrown toenail area is doing well and not bothering him.  Denies any other pedal complaints. Denies n/v/f/c.    Patient is diabetic and last A1c was  Lab Results  Component Value Date   HGBA1C 8.4 (H) 11/30/2022   .   PCP:  Garnette Gunner, MD     Past Medical History:  Diagnosis Date   A-fib    CAD (coronary artery disease)    Hypertension    Obstructive sleep apnea    CPAP   Rosacea    Type 2 diabetes mellitus with hyperglycemia     Objective:  Physical Exam: Vascular: DP/PT pulses 2/4 bilateral. CFT <3 seconds. Normal hair growth on digits. No edema.  Skin. No lacerations or abrasions bilateral feet. Right second digit dorsal wound with fibrosis and eschar overlying with surrounding erythema. No purulence noted. No probe to bone noted. Minimal erythema surrounding toe and much improved from prior. Wound measures 2.5 cm x 0.5 cm x 0.2 cm upon debridement. Right great toe lateral border nail incurvated with no erythema edema or purulence noted.  Musculoskeletal: MMT 5/5 bilateral lower extremities in DF, PF, Inversion and Eversion. Deceased ROM in DF of ankle joint.  Neurological: Sensation intact to light touch.   ABIs:  IMPRESSION: Nondiagnostic ankle brachial indices due to noncompressible arteries at the level of the ankle. Lower extremity arterial duplex, CTA runoff, or direct lower extremity angiography could be considered for further characterization as clinically indicated.    Assessment:   1. Diabetic ulcer  of toe of right foot associated with diabetes mellitus due to underlying condition, with fat layer exposed   2. Type 2 diabetes mellitus with hyperglycemia, without long-term current use of insulin         Plan:  Patient was evaluated and treated and all questions answered. Ulcer right second digit with unstageable ulcer with cellulitis  -Debridement as below  -X-rays reviewed No obvious osseous erosions noted.  -Dressed with betadine, DSD. -Off-loading with surgical shoe. -No abx indicated today.  -ABIs reviewed and abnormal. Underwent angio  Recommendations: The patient has inline flow to the right toes via the posterior tibial artery.  No indication for revascularization.  Continue medical therapy. -Discussed glucose control and proper protein-rich diet.  -Discussed if any worsening redness, pain, fever or chills to call or may need to report to the emergency room. Patient expressed understanding.  -Discussed and educated patient on diabetic foot care, especially with  regards to the vascular, neurological and musculoskeletal systems.  -Stressed the importance of good glycemic control and the detriment of not  controlling glucose levels in relation to the foot. -Discussed supportive shoes at all times and checking feet regularly.      Procedure: Excisional Debridement of Wound Rationale: Removal of non-viable soft tissue from the wound to promote healing.  Anesthesia: none Pre-Debridement Wound Measurements: Overlying necrotic tissue  Post-Debridement Wound Measurements: 2.5 cm x 0.5 cm x 0.2 cm  Type of Debridement: Sharp Excisional Tissue  Removed: Non-viable soft tissue Depth of Debridement: subcutaneous tissue. Technique: Sharp excisional debridement to bleeding, viable wound base.  Dressing: Dry, sterile, compression dressing. Disposition: Patient tolerated procedure well. Patient to return in 2 week for follow-up.  Return in about 2 weeks (around 01/02/2023) for wound  check.    Return in about 2 weeks (around 01/02/2023) for wound check.   Louann Sjogren, DPM

## 2022-12-24 ENCOUNTER — Other Ambulatory Visit (HOSPITAL_BASED_OUTPATIENT_CLINIC_OR_DEPARTMENT_OTHER): Payer: Self-pay | Admitting: Cardiology

## 2022-12-26 NOTE — Telephone Encounter (Signed)
Rx(s) sent to pharmacy electronically.  

## 2022-12-30 DIAGNOSIS — E039 Hypothyroidism, unspecified: Secondary | ICD-10-CM | POA: Diagnosis not present

## 2022-12-30 DIAGNOSIS — E7801 Familial hypercholesterolemia: Secondary | ICD-10-CM | POA: Diagnosis not present

## 2022-12-30 DIAGNOSIS — E1169 Type 2 diabetes mellitus with other specified complication: Secondary | ICD-10-CM | POA: Diagnosis not present

## 2022-12-30 DIAGNOSIS — E559 Vitamin D deficiency, unspecified: Secondary | ICD-10-CM | POA: Diagnosis not present

## 2022-12-30 DIAGNOSIS — D519 Vitamin B12 deficiency anemia, unspecified: Secondary | ICD-10-CM | POA: Diagnosis not present

## 2022-12-30 DIAGNOSIS — I509 Heart failure, unspecified: Secondary | ICD-10-CM | POA: Diagnosis not present

## 2023-01-01 DIAGNOSIS — I509 Heart failure, unspecified: Secondary | ICD-10-CM | POA: Diagnosis not present

## 2023-01-01 DIAGNOSIS — I10 Essential (primary) hypertension: Secondary | ICD-10-CM | POA: Diagnosis not present

## 2023-01-01 DIAGNOSIS — I739 Peripheral vascular disease, unspecified: Secondary | ICD-10-CM | POA: Diagnosis not present

## 2023-01-02 ENCOUNTER — Ambulatory Visit (INDEPENDENT_AMBULATORY_CARE_PROVIDER_SITE_OTHER): Payer: Medicare Other | Admitting: Podiatry

## 2023-01-02 ENCOUNTER — Encounter: Payer: Self-pay | Admitting: Podiatry

## 2023-01-02 DIAGNOSIS — L97512 Non-pressure chronic ulcer of other part of right foot with fat layer exposed: Secondary | ICD-10-CM | POA: Diagnosis not present

## 2023-01-02 DIAGNOSIS — E08621 Diabetes mellitus due to underlying condition with foot ulcer: Secondary | ICD-10-CM | POA: Diagnosis not present

## 2023-01-02 NOTE — Progress Notes (Signed)
Subjective:  Patient ID: [name], male    DOB: 1950-12-23,   MRN: [id number]  Chief Complaint  Patient presents with   Wound Check    Right foot second toe ingrown patient states it is improving since the last visit. Denies any pain or discharge    72 y.o. male presents for follow-up of right second toe wound and ingrown right great toe. Underwent angio and optimized. Marland KitchenHe has been keeping betadine on the toe. Relates the ingrown toenail area is doing well and not bothering him.  Denies any other pedal complaints. Denies n/v/f/c.    Patient is diabetic and last A1c was  Lab Results  Component Value Date   HGBA1C 8.4 (H) 11/30/2022   .   PCP:  Garnette Gunner, MD     Past Medical History:  Diagnosis Date   A-fib    CAD (coronary artery disease)    Hypertension    Obstructive sleep apnea    CPAP   Rosacea    Type 2 diabetes mellitus with hyperglycemia     Objective:  Physical Exam: Vascular: DP/PT pulses 2/4 bilateral. CFT <3 seconds. Normal hair growth on digits. No edema.  Skin. No lacerations or abrasions bilateral feet. Right second digit dorsal wound with fibrosis and eschar overlying with surrounding erythema. No purulence noted. No probe to bone noted. Minimal erythema surrounding toe and much improved from prior. Wound measures 2.4 cm x 0.5 cm x 0.2 cm upon debridement. Right great toe lateral border nail incurvated with no erythema edema or purulence noted.  Musculoskeletal: MMT 5/5 bilateral lower extremities in DF, PF, Inversion and Eversion. Deceased ROM in DF of ankle joint.  Neurological: Sensation intact to light touch.   ABIs:  IMPRESSION: Nondiagnostic ankle brachial indices due to noncompressible arteries at the level of the ankle. Lower extremity arterial duplex, CTA runoff, or direct lower extremity angiography could be considered for further characterization as clinically indicated.    Assessment:   1. Diabetic ulcer of toe of right foot  associated with diabetes mellitus due to underlying condition, with fat layer exposed          Plan:  Patient was evaluated and treated and all questions answered. Ulcer right second digit with unstageable ulcer with cellulitis  -Debridement as below  -X-rays reviewed No obvious osseous erosions noted.  -Dressed with betadine, DSD. -Off-loading with surgical shoe. -No abx indicated today.  -ABIs reviewed and abnormal. Underwent angio  Recommendations: The patient has inline flow to the right toes via the posterior tibial artery.  No indication for revascularization.  Continue medical therapy. -Discussed glucose control and proper protein-rich diet.  -Discussed if any worsening redness, pain, fever or chills to call or may need to report to the emergency room. Patient expressed understanding.  -Discussed and educated patient on diabetic foot care, especially with  regards to the vascular, neurological and musculoskeletal systems.  -Stressed the importance of good glycemic control and the detriment of not  controlling glucose levels in relation to the foot. -Discussed supportive shoes at all times and checking feet regularly.      Procedure: Excisional Debridement of Wound Rationale: Removal of non-viable soft tissue from the wound to promote healing.  Anesthesia: none Pre-Debridement Wound Measurements: Overlying necrotic tissue  Post-Debridement Wound Measurements: 2.4 cm x 0.5 cm x 0.2 cm  Type of Debridement: Sharp Excisional Tissue Removed: Non-viable soft tissue Depth of Debridement: subcutaneous tissue. Technique: Sharp excisional debridement to bleeding, viable wound base.  Dressing:  Dry, sterile, compression dressing. Disposition: Patient tolerated procedure well. Patient to return in 2 week for follow-up.  No follow-ups on file.    No follow-ups on file.   Louann Sjogren, DPM

## 2023-01-03 ENCOUNTER — Encounter: Payer: Self-pay | Admitting: Physician Assistant

## 2023-01-03 ENCOUNTER — Ambulatory Visit: Payer: Medicare Other | Attending: Physician Assistant | Admitting: Physician Assistant

## 2023-01-03 VITALS — BP 150/64 | HR 78 | Ht 69.0 in | Wt 186.6 lb

## 2023-01-03 DIAGNOSIS — I482 Chronic atrial fibrillation, unspecified: Secondary | ICD-10-CM | POA: Diagnosis not present

## 2023-01-03 DIAGNOSIS — E119 Type 2 diabetes mellitus without complications: Secondary | ICD-10-CM

## 2023-01-03 DIAGNOSIS — I701 Atherosclerosis of renal artery: Secondary | ICD-10-CM | POA: Diagnosis not present

## 2023-01-03 DIAGNOSIS — I10 Essential (primary) hypertension: Secondary | ICD-10-CM | POA: Insufficient documentation

## 2023-01-03 DIAGNOSIS — I739 Peripheral vascular disease, unspecified: Secondary | ICD-10-CM

## 2023-01-03 DIAGNOSIS — E785 Hyperlipidemia, unspecified: Secondary | ICD-10-CM | POA: Diagnosis not present

## 2023-01-03 DIAGNOSIS — I25810 Atherosclerosis of coronary artery bypass graft(s) without angina pectoris: Secondary | ICD-10-CM | POA: Diagnosis not present

## 2023-01-03 NOTE — Progress Notes (Unsigned)
Cardiology Office Note:    Date:  01/05/2023   ID:  [name], DOB 13-Mar-1951, MRN [id number]  PCP:  Garnette Gunner, MD   [facility] Providers Cardiologist:  Rollene Rotunda, MD     Referring MD: Garnette Gunner, MD   Chief Complaint  Patient presents with   Follow-up    Seen for Dr. Antoine Poche    History of Present Illness:    [name] is a 72 y.o. male with a hx of CAD s/p CABG, chronic A-fib, syncope, OSA on CPAP, hypertension, hyperlipidemia and DM2.  Patient had a history of LIMA to LAD, sequential SVG to ramus and OM1 and a sequential SVG to PDA and PLA.  Last cardiac catheterization performed in 2022 revealed 3 out of 5 grafts patent, sequential SVG to ramus and OM was occluded at its origin.  He was managed medically.  EF was 40% at the time of cath.  He was last seen by Dr. Antoine Poche in February 2024, ABI screening test was done which revealed noncompressible lower extremity artery with monophasic arterial waveform in bilateral ankle.  He also developed ulceration in the right second toe that had poor healing.  He was seen by Dr. Kirke Corin and ultimately underwent lower extremity angiography on 11/30/2022 which showed subtotally occluded left renal artery with atrophy of the left kidney, no significant aortoiliac disease, mild nonobstructive disease in the right SFA and popliteal vessel, two-vessel runoff below the knee via PT and peroneal artery, occluded right anterior tibial artery distally was dorsalis pedis artery filled retrogradely from the pedal arch.  No indication for revascularization as the patient was receiving flow from the posterior tibial artery.  Medical therapy was recommended.  Eliquis was resumed on the following day.  It was recommended to obtain renal artery duplex as outpatient to evaluate left kidney size and the flow to make decision regarding left renal artery revascularization.  Echocardiogram obtained on the same day showed EF 50 to 55%,  hypokinesis of the LV, apical anterior wall and apical segment, moderate biatrial enlargement, normal RV, mild MR and mild aortic stenosis.  Posthospital course complicated by acute hypoxic respiratory failure and volume overload requiring IV diuresis.  He eventually put out 8 L after 2 days of IV diuresis.  Blood work showed hyponatremia and hypokalemia.  SGLT2/farxiga was added, Actos, valsartan and hydrochlorothiazide removed.  Telemetry showed recurrent atrial fibrillation and atrial flutter with controlled ventricular rate.  Renal artery duplex obtained prior to discharge showed 1 to 59% left renal artery stenosis with normal size left kidney, right renal artery had abnormal resistance index but no significant renal artery stenosis.  Patient presents today for evaluation along with his wife.  Repeat blood pressure was 142/68 and still elevated.  He says his blood pressure has been in the 120s at home.  His most recent blood pressure during last office visit in early this month was in the 110s.  I decided to hold off on adding back valsartan that was recently removed during hospitalization.  He will contact us if his systolic blood pressure keeps going up to the high 130s, if that is the case I will resume the previous valsartan.  Since the renal artery ultrasound showed patent renal artery was mild to moderate obstructive disease on the left side however normal left renal size, I doubt he will need any urgent vascular intervention.  I will check with Dr. Kirke Corin to see if he would prefer to have a  repeat renal artery ultrasound at some point.   Past Medical History:  Diagnosis Date   A-fib [facility])    CAD (coronary artery disease)    Hypertension    Obstructive sleep apnea    CPAP   Rosacea    Type 2 diabetes mellitus with hyperglycemia [facility])     Past Surgical History:  Procedure Laterality Date   ABDOMINAL AORTOGRAM W/LOWER EXTREMITY Right 11/30/2022   Procedure: ABDOMINAL AORTOGRAM W/LOWER  EXTREMITY;  Surgeon: Iran Ouch, MD;  Location: [facility] INVASIVE CV LAB;  Service: Cardiovascular;  Laterality: Right;   CORONARY ARTERY BYPASS GRAFT     LIMA to the LAD, sequential SVG to ramus intermediate and OM1, sequential SVG to PDA and posterior lateral   RIGHT/LEFT HEART CATH AND CORONARY/GRAFT ANGIOGRAPHY N/A 01/21/2021   Procedure: RIGHT/LEFT HEART CATH AND CORONARY/GRAFT ANGIOGRAPHY;  Surgeon: Runell Gess, MD;  Location: [facility] INVASIVE CV LAB;  Service: Cardiovascular;  Laterality: N/A;    Current Medications: Current Meds  Medication Sig   amLODipine (NORVASC) 10 MG tablet Take 10 mg by mouth daily.   apixaban (ELIQUIS) 5 MG TABS tablet Take 1 tablet (5 mg total) by mouth 2 (two) times daily.   aspirin EC 81 MG tablet Take 1 tablet (81 mg total) by mouth daily. Swallow whole.   dapagliflozin propanediol (FARXIGA) 10 MG TABS tablet Take 1 tablet (10 mg total) by mouth daily.   Dulaglutide (TRULICITY) 3 MG/0.5ML SOPN Inject 3 mg into the skin every Saturday.   ergocalciferol (VITAMIN D2) 1.25 MG (50000 UT) capsule Take 50,000 Units by mouth every Saturday.   furosemide (LASIX) 20 MG tablet TAKE 1 TABLET BY MOUTH DAILY AS NEEDED (WEIGHT GAIN OF 3-5LB OR INCREASED SHORTNESS OF BREATH).   glipiZIDE (GLUCOTROL) 10 MG tablet Take 10 mg by mouth 2 (two) times daily before a meal.   metFORMIN (GLUCOPHAGE-XR) 500 MG 24 hr tablet Take 500 mg by mouth 2 (two) times daily with a meal.   rosuvastatin (CRESTOR) 10 MG tablet Take 10 mg by mouth See admin instructions. Takes 10 mg on Mondays and Fridays   triamcinolone cream (KENALOG) 0.1 % Apply 1 Application topically 2 (two) times daily as needed (dry skin).     Allergies:   Patient has no known allergies.   Social History   Socioeconomic History   Marital status: Married    Spouse name: [name]   Number of children: 3   Years of education: Not on file   Highest education level: Not on file  Occupational History   Not on file   Tobacco Use   Smoking status: Never   Smokeless tobacco: Current    Types: Snuff   Tobacco comments:    Uses Snus   Vaping Use   Vaping Use: Never used  Substance and Sexual Activity   Alcohol use: Yes    Comment: 1-2 beers a day   Drug use: Never   Sexual activity: Not on file  Other Topics Concern   Not on file  Social History Narrative   Lives with wife and two grandchildren.     Lives in a one story home    Right Handed    Drinks little caffeine    Social Determinants of Health   Financial Resource Strain: Not on file  Food Insecurity: Not on file  Transportation Needs: Not on file  Physical Activity: Not on file  Stress: Not on file  Social Connections: Not on file     Family  History: The patient's family history includes Brain cancer in his mother; Breast cancer in his mother; CAD (age of onset: 28) in his brother; Cancer - Other in his father; Diabetes in his brother and mother; Fibromyalgia in his sister; Heart Problems in his sister; Heart attack in his mother; Heart attack (age of onset: 33) in his father; Heart disease in his father; Heart disease (age of onset: 48) in his mother; Kidney cancer in his father; Migraines in his sister.  ROS:   Please see the history of present illness.     All other systems reviewed and are negative.  EKGs/Labs/Other Studies Reviewed:    The following studies were reviewed today:  PV Angio 11/30/22 1.  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 2.  No significant aortoiliac disease. 3.  Right lower extremity: Calcified vessels with mild nonobstructive disease affecting the SFA and popliteal arteries, two-vessel runoff below the knee via the posterior tibial and peroneal artery.  Anterior tibial artery occluded distally and the dorsalis pedis fills retrograde from the pedal arch.   Recommendations: The patient has inline flow to the right toes via the posterior tibial artery.  No indication for  revascularization.  Continue medical therapy. Resume Eliquis tomorrow as long as no bleeding issues. 50 mL of contrast was used for the procedure.  The patient did develop shortness of breath and hypoxia with IV fluid hydration and was given 1 dose of IV furosemide before the procedure.  He can be discharged home as long as he is not requiring any supplemental oxygen. Will plan on obtaining renal artery duplex as an outpatient to evaluate left kidney size and flow to make decision regarding left renal artery revascularization   2D echo 11/30/22    1. Left ventricular ejection fraction, by estimation, is 50 to 55%. The  left ventricle has low normal function. The left ventricle demonstrates  regional wall motion abnormalities (see scoring diagram/findings for  description). Left ventricular diastolic   parameters are indeterminate. There is hypokinesis of the left  ventricular, apical anterior wall and apical segment.   2. Right ventricular systolic function is normal. The right ventricular  size is normal.   3. Left atrial size was moderately dilated.   4. Right atrial size was moderately dilated.   5. The mitral valve is normal in structure. Mild mitral valve  regurgitation. No evidence of mitral stenosis.   6. The aortic valve is tricuspid. There is mild calcification of the  aortic valve. Aortic valve regurgitation is not visualized. Mild aortic  valve stenosis. Aortic valve area, by VTI measures 1.80 cm. Aortic valve  mean gradient measures 7.0 mmHg.  Aortic valve Vmax measures 1.73 m/s.   7. The inferior vena cava is normal in size with greater than 50%  respiratory variability, suggesting right atrial pressure of 3 mmHg.   EKG:  EKG is not ordered today.    Recent Labs: 12/01/2022: TSH 2.490 12/02/2022: Hemoglobin 11.9; Magnesium 2.0; Platelets 203 12/13/2022: BUN 14; Creatinine, Ser 1.40; Potassium 5.0; Sodium 138  Recent Lipid Panel    Component Value Date/Time   CHOL 138  12/01/2022 0446   TRIG 74 12/01/2022 0446   HDL 39 (L) 12/01/2022 0446   CHOLHDL 3.5 12/01/2022 0446   VLDL 15 12/01/2022 0446   LDLCALC 84 12/01/2022 0446     Risk Assessment/Calculations:           Physical Exam:    VS:  BP (!) 150/64 (BP Location: Left  Arm, Patient Position: Sitting, Cuff Size: Large)   Pulse 78   Ht  (1.753 m)   Wt 186 lb 9.6 oz (84.6 kg)   SpO2 94%   BMI 27.56 kg/m       Wt Readings from Last 3 Encounters:  01/03/23 186 lb 9.6 oz (84.6 kg)  12/13/22 188 lb 12.8 oz (85.6 kg)  12/02/22 185 lb 6.5 oz (84.1 kg)     GEN:  Well nourished, well developed in no acute distress HEENT: Normal NECK: No JVD; No carotid bruits LYMPHATICS: No lymphadenopathy CARDIAC: RRR, no murmurs, rubs, gallops RESPIRATORY:  Clear to auscultation without rales, wheezing or rhonchi  ABDOMEN: Soft, non-tender, non-distended MUSCULOSKELETAL:  No edema; No deformity  SKIN: Warm and dry NEUROLOGIC:  Alert and oriented x 3 PSYCHIATRIC:  Normal affect   ASSESSMENT:    1. Left renal artery stenosis (HCC)   2. Coronary artery disease involving coronary bypass graft of native heart without angina pectoris   3. Chronic atrial fibrillation (HCC)   4. Essential hypertension   5. Hyperlipidemia LDL goal <70   6. Controlled type 2 diabetes mellitus without complication, without long-term current use of insulin (HCC)   7. PAD (peripheral artery disease) (HCC)    PLAN:    In order of problems listed above:  Left renal artery stenosis: During the recent aortogram and a lower extremity angiography, it was suspected patient had severe left renal artery stenosis.  Subsequent renal artery duplex however showed 1 to 59% left renal artery stenosis with normal size left kidney, no significant disease in the right renal artery.  His blood pressure is fairly controlled.  Will discuss with Dr. Kirke Corin, no additional workup planned at this time given mild disease  CAD s/p CABG: Denies any  recent chest pain  Chronic atrial fibrillation: On Eliquis  Hypertension: Blood pressure stable.  Valsartan was discontinued during the recent hospitalization, his blood pressure is actually normal even without the valsartan.  Therefore I did not restart it today.  Hyperlipidemia: On rosuvastatin  DM2: Managed per primary care provider.  PAD: Recent lower extremity angiography revealed mild nonobstructive disease in the right SFA and popliteal vessel, two-vessel runoff below the knee via PT and peroneal artery, occluded right anterior tibial artery distally was dorsalis pedis artery filled retrogradely from the pedal arch.  No indication for revascularization as the patient was receiving flow from the posterior tibial artery           Medication Adjustments/Labs and Tests Ordered: Current medicines are reviewed at length with the patient today.  Concerns regarding medicines are outlined above.  No orders of the defined types were placed in this encounter.  No orders of the defined types were placed in this encounter.   Patient Instructions  Medication Instructions:   Your physician recommends that you continue on your current medications as directed. Please refer to the Current Medication list given to you today.  *If you need a refill on your cardiac medications before your next appointment, please call your pharmacy*  Lab Work: NONE ordered at this time of appointment   If you have labs (blood work) drawn today and your tests are completely normal, you will receive your results only by: MyChart Message (if you have MyChart) OR A paper copy in the mail If you have any lab test that is abnormal or we need to change your treatment, we will call you to review the results.  Testing/Procedures: NONE ordered at this time of  appointment   Follow-Up: At [facility], you and your health needs are our priority.  As part of our continuing mission to provide you with  exceptional heart care, we have created designated Provider Care Teams.  These Care Teams include your primary Cardiologist (physician) and Advanced Practice Providers (APPs -  Physician Assistants and Nurse Practitioners) who all work together to provide you with the care you need, when you need it.  Your next appointment:   10 month(s)  Provider:   Rollene Rotunda, MD     Other Instructions     Signed, Azalee Course, PA  01/05/2023 11:06 PM    [facility]

## 2023-01-03 NOTE — Patient Instructions (Signed)
Medication Instructions:   Your physician recommends that you continue on your current medications as directed. Please refer to the Current Medication list given to you today.  *If you need a refill on your cardiac medications before your next appointment, please call your pharmacy*  Lab Work: NONE ordered at this time of appointment   If you have labs (blood work) drawn today and your tests are completely normal, you will receive your results only by: MyChart Message (if you have MyChart) OR A paper copy in the mail If you have any lab test that is abnormal or we need to change your treatment, we will call you to review the results.  Testing/Procedures: NONE ordered at this time of appointment   Follow-Up: At [facility], you and your health needs are our priority.  As part of our continuing mission to provide you with exceptional heart care, we have created designated Provider Care Teams.  These Care Teams include your primary Cardiologist (physician) and Advanced Practice Providers (APPs -  Physician Assistants and Nurse Practitioners) who all work together to provide you with the care you need, when you need it.  Your next appointment:   10 month(s)  Provider:   Rollene Rotunda, MD     Other Instructions

## 2023-01-05 ENCOUNTER — Encounter: Payer: Self-pay | Admitting: Physician Assistant

## 2023-01-05 DIAGNOSIS — Z6828 Body mass index (BMI) 28.0-28.9, adult: Secondary | ICD-10-CM | POA: Diagnosis not present

## 2023-01-05 DIAGNOSIS — I2510 Atherosclerotic heart disease of native coronary artery without angina pectoris: Secondary | ICD-10-CM | POA: Diagnosis not present

## 2023-01-05 DIAGNOSIS — E1165 Type 2 diabetes mellitus with hyperglycemia: Secondary | ICD-10-CM | POA: Diagnosis not present

## 2023-01-05 DIAGNOSIS — I10 Essential (primary) hypertension: Secondary | ICD-10-CM | POA: Diagnosis not present

## 2023-01-05 DIAGNOSIS — E7801 Familial hypercholesterolemia: Secondary | ICD-10-CM | POA: Diagnosis not present

## 2023-01-05 DIAGNOSIS — I509 Heart failure, unspecified: Secondary | ICD-10-CM | POA: Diagnosis not present

## 2023-01-05 DIAGNOSIS — I739 Peripheral vascular disease, unspecified: Secondary | ICD-10-CM | POA: Diagnosis not present

## 2023-01-05 DIAGNOSIS — E118 Type 2 diabetes mellitus with unspecified complications: Secondary | ICD-10-CM | POA: Diagnosis not present

## 2023-01-10 NOTE — Patient Instructions (Signed)

## 2023-01-12 ENCOUNTER — Other Ambulatory Visit: Payer: Self-pay | Admitting: Nurse Practitioner

## 2023-01-12 ENCOUNTER — Encounter: Payer: Self-pay | Admitting: Nurse Practitioner

## 2023-01-12 ENCOUNTER — Ambulatory Visit (INDEPENDENT_AMBULATORY_CARE_PROVIDER_SITE_OTHER): Payer: Medicare Other | Admitting: Nurse Practitioner

## 2023-01-12 ENCOUNTER — Telehealth: Payer: Self-pay

## 2023-01-12 VITALS — BP 126/84 | HR 69 | Ht 69.0 in | Wt 186.2 lb

## 2023-01-12 DIAGNOSIS — E1165 Type 2 diabetes mellitus with hyperglycemia: Secondary | ICD-10-CM

## 2023-01-12 MED ORDER — POGO AUTOMATIC TEST CARTRIDGES VI TEST: 3 refills | Status: DC

## 2023-01-12 MED ORDER — POGO AUTOMATIC BLOOD GLUCOSE DEVI: 0 refills | Status: DC

## 2023-01-12 NOTE — Telephone Encounter (Signed)
A family member left a message to confirm that the pt did receive Trulicity 4.5mg  through PAP. According to office note pt is on 3mg . Wasn't  sure if it needed to be changed to 4.5 mg in the chart.  Attempted to call back [phone] no answer)

## 2023-01-12 NOTE — Telephone Encounter (Signed)
Can we request to do a PA?  He cannot perform fingersticks due to limited dexterity and hand tremors

## 2023-01-12 NOTE — Telephone Encounter (Signed)
Ill change it in the chart

## 2023-01-12 NOTE — Progress Notes (Addendum)
Endocrinology Consult Note       01/12/2023, 11:19 AM   Subjective:    Patient ID: [name], male    DOB: [birth year].  [name] is being seen in consultation for management of currently uncontrolled symptomatic diabetes requested by  Donetta Potts, MD.   Past Medical History:  Diagnosis Date   A-fib [facility])    CAD (coronary artery disease)    Hypertension    Obstructive sleep apnea    CPAP   Rosacea    Type 2 diabetes mellitus with hyperglycemia [facility])     Past Surgical History:  Procedure Laterality Date   ABDOMINAL AORTOGRAM W/LOWER EXTREMITY Right 11/30/2022   Procedure: ABDOMINAL AORTOGRAM W/LOWER EXTREMITY;  Surgeon: Iran Ouch, MD;  Location: [facility];  Service: Cardiovascular;  Laterality: Right;   CORONARY ARTERY BYPASS GRAFT     LIMA to the LAD, sequential SVG to ramus intermediate and OM1, sequential SVG to PDA and posterior lateral   RIGHT/LEFT HEART CATH AND CORONARY/GRAFT ANGIOGRAPHY N/A 01/21/2021   Procedure: RIGHT/LEFT HEART CATH AND CORONARY/GRAFT ANGIOGRAPHY;  Surgeon: Runell Gess, MD;  Location: [facility];  Service: Cardiovascular;  Laterality: N/A;    Social History   Socioeconomic History   Marital status: Married    Spouse name: Production designer, theatre/television/film   Number of children: 3   Years of education: Not on file   Highest education level: Not on file  Occupational History   Not on file  Tobacco Use   Smoking status: Never   Smokeless tobacco: Current    Types: Snuff   Tobacco comments:    Uses Snus   Vaping Use   Vaping Use: Never used  Substance and Sexual Activity   Alcohol use: Yes    Comment: 1-2 beers a day   Drug use: Never   Sexual activity: Not on file  Other Topics Concern   Not on file  Social History Narrative   Lives with wife and two grandchildren.     Lives in a one story home    Right Handed    Drinks little caffeine     Social Determinants of Health   Financial Resource Strain: Not on file  Food Insecurity: Not on file  Transportation Needs: Not on file  Physical Activity: Not on file  Stress: Not on file  Social Connections: Not on file    Family History  Problem Relation Age of Onset   Heart disease Mother 85   Diabetes Mother    Breast cancer Mother    Heart attack Mother    Brain cancer Mother    Heart disease Father    Kidney cancer Father        died from cancer   Heart attack Father 39   Cancer - Other Father        Spinal Cancer   Migraines Sister    Fibromyalgia Sister    Heart Problems Sister        Heart problems after birth of child   CAD Brother 32       CABG   Diabetes Brother     Outpatient Encounter Medications as of  01/12/2023  Medication Sig   amLODipine (NORVASC) 10 MG tablet Take 10 mg by mouth daily.   apixaban (ELIQUIS) 5 MG TABS tablet Take 1 tablet (5 mg total) by mouth 2 (two) times daily.   aspirin EC 81 MG tablet Take 1 tablet (81 mg total) by mouth daily. Swallow whole.   Blood Glucose Monitoring Suppl (POGO AUTOMATIC BLOOD GLUCOSE) DEVI Use to check glucose twice daily   dapagliflozin propanediol (FARXIGA) 10 MG TABS tablet Take 1 tablet (10 mg total) by mouth daily.   Dulaglutide (TRULICITY) 3 MG/0.5ML SOPN Inject 3 mg into the skin every Saturday.   ergocalciferol (VITAMIN D2) 1.25 MG (50000 UT) capsule Take 50,000 Units by mouth every Saturday.   furosemide (LASIX) 20 MG tablet TAKE 1 TABLET BY MOUTH DAILY AS NEEDED (WEIGHT GAIN OF 3-5LB OR INCREASED SHORTNESS OF BREATH).   glipiZIDE (GLUCOTROL) 10 MG tablet Take 5 mg by mouth 2 (two) times daily before a meal.   Glucose Blood Automatic (POGO AUTOMATIC TEST CARTRIDGES) TEST Use to check glucose twice daily   metFORMIN (GLUCOPHAGE-XR) 500 MG 24 hr tablet Take 500 mg by mouth daily with breakfast.   rosuvastatin (CRESTOR) 10 MG tablet Take 10 mg by mouth See admin instructions. Takes 10 mg on Mondays and  Fridays   triamcinolone cream (KENALOG) 0.1 % Apply 1 Application topically 2 (two) times daily as needed (dry skin).   No facility-administered encounter medications on file as of 01/12/2023.    ALLERGIES: No Known Allergies  VACCINATION STATUS:  There is no immunization history on file for this patient.  Diabetes He presents for his initial diabetic visit. He has type 2 diabetes mellitus. Onset time: diagnosed at approx age of 33. His disease course has been improving. Hypoglycemia symptoms include hunger, nervousness/anxiousness, sleepiness, sweats and tremors. Associated symptoms include blurred vision, foot ulcerations and polyuria. There are no hypoglycemic complications. Symptoms are stable. Diabetic complications include heart disease (MI when he was 49), nephropathy, peripheral neuropathy, PVD and retinopathy. Risk factors for coronary artery disease include diabetes mellitus, dyslipidemia, family history, hypertension, male sex, obesity and sedentary lifestyle. Current diabetic treatment includes oral agent (triple therapy) (and Trulicity). He is compliant with treatment most of the time. His weight is fluctuating minimally. He is following a generally unhealthy diet. When asked about meal planning, he reported none. He has not had a previous visit with a dietitian. He rarely participates in exercise. (He presents today for his consultation, accompanied by his wife, with no meter or logs to review.  His most recent A1c was 8.4% on 3/20 (says it was recently checked about 2 weeks ago and found to be 7.6% but there is no documentation of it that I can see).  He does not check his glucose at home, cannot perform due to chronic hand tremors and dexterity problems.  He does not eat on any routine schedule.  He drinks mostly diet Mt. Dew, water, and 3-4 beers per day.  He lives a pretty sedentary lifestyle due to gait issues and multiple falls.  He is UTD on eye exam (hx of retinopathy with  injections), does see podiatry due to chronic DM foot ulcer.) An ACE inhibitor/angiotensin II receptor blocker is not being taken. He sees a podiatrist.Eye exam is current.     Review of systems  Constitutional: + Minimally fluctuating body weight, current Body mass index is 27.5 kg/m., no fatigue, no subjective hyperthermia, no subjective hypothermia Eyes: + blurry vision (hx retinopathy), no xerophthalmia ENT: no  sore throat, no nodules palpated in throat, no dysphagia/odynophagia, no hoarseness Cardiovascular: no chest pain, no shortness of breath, + intermittent palpitations-hx afib, no leg swelling Respiratory: no cough, no shortness of breath Gastrointestinal: no nausea/vomiting/diarrhea Musculoskeletal: no muscle/joint aches, walks with cane for frequent falls Skin: no rashes, no hyperemia, + chronic DM foot ulcer treated by [facility] Neurological: + chronic hand tremors with decreased dexterity, no numbness, no tingling, no dizziness Psychiatric: no depression, no anxiety  Objective:     BP 126/84 (BP Location: Left Arm, Patient Position: Sitting, Cuff Size: Large)   Pulse 69   Ht 5\' 9"  (1.753 m)   Wt 186 lb 3.2 oz (84.5 kg)   BMI 27.50 kg/m   Wt Readings from Last 3 Encounters:  01/12/23 186 lb 3.2 oz (84.5 kg)  01/03/23 186 lb 9.6 oz (84.6 kg)  12/13/22 188 lb 12.8 oz (85.6 kg)     BP Readings from Last 3 Encounters:  01/12/23 126/84  01/03/23 (!) 150/64  12/13/22 (!) 114/52     Physical Exam- Limited  Constitutional:  Body mass index is 27.5 kg/m. , not in acute distress, normal state of mind Eyes:  EOMI, no exophthalmos Neck: Supple Cardiovascular: RRR, no murmurs, rubs, or gallops, no edema Respiratory: Adequate breathing efforts, no crackles, rales, rhonchi, or wheezing Musculoskeletal: walks with cane- has frequent falls Skin:  no rashes, no hyperemia    Diabetic Foot Exam - Simple   No data filed     CMP ( most recent) CMP      Component Value Date/Time   NA 138 12/13/2022 0943   K 5.0 12/13/2022 0943   CL 98 12/13/2022 0943   CO2 22 12/13/2022 0943   GLUCOSE 303 (H) 12/13/2022 0943   GLUCOSE 100 (H) 12/02/2022 0116   BUN 14 12/13/2022 0943   CREATININE 1.40 (H) 12/13/2022 0943   CALCIUM 10.0 12/13/2022 0943   PROT 7.2 01/19/2021 2031   ALBUMIN 3.7 01/19/2021 2031   AST 17 01/19/2021 2031   ALT 19 01/19/2021 2031   ALKPHOS 91 01/19/2021 2031   BILITOT 1.2 01/19/2021 2031   GFRNONAA 50 (L) 12/02/2022 0116   GFRAA  07/24/2007 1130    >60        The eGFR has been calculated using the MDRD equation. This calculation has not been validated in all clinical     Diabetic Labs (most recent): Lab Results  Component Value Date   HGBA1C 8.4 (H) 11/30/2022   HGBA1C 7.8 (H) 01/19/2021     Lipid Panel ( most recent) Lipid Panel     Component Value Date/Time   CHOL 138 12/01/2022 0446   TRIG 74 12/01/2022 0446   HDL 39 (L) 12/01/2022 0446   CHOLHDL 3.5 12/01/2022 0446   VLDL 15 12/01/2022 0446   LDLCALC 84 12/01/2022 0446      Lab Results  Component Value Date   TSH 2.490 12/01/2022           Assessment & Plan:   1) Type 2 diabetes mellitus with hyperglycemia, without long-term current use of insulin (HCC)  He presents today for his consultation, accompanied by his wife, with no meter or logs to review.  His most recent A1c was 8.4% on 3/20 (says it was recently checked about 2 weeks ago and found to be 7.6% but there is no documentation of it that I can see).  He does not check his glucose at home, cannot perform due to chronic hand tremors  and dexterity problems.  He does not eat on any routine schedule.  He drinks mostly diet Mt. Dew, water, and 3-4 beers per day.  He lives a pretty sedentary lifestyle due to gait issues and multiple falls.  He is UTD on eye exam (hx of retinopathy with injections), does see podiatry due to chronic DM foot ulcer.  - [name] has currently  uncontrolled symptomatic type 2 DM since 72 years of age, with most recent A1c of 8.4 %.   -Recent labs reviewed.  - I had a long discussion with him about the progressive nature of diabetes and the pathology behind its complications. -his diabetes is complicated by CAD, CHF, PAD, CKD stage 3a, DM foot ulcers and he remains at a high risk for more acute and chronic complications which include CAD, CVA, CKD, retinopathy, and neuropathy. These are all discussed in detail with him.  The following Lifestyle Medicine recommendations according to American College of Lifestyle Medicine [facility]) were discussed and offered to patient and he agrees to start the journey:  A. Whole Foods, Plant-based plate comprising of fruits and vegetables, plant-based proteins, whole-grain carbohydrates was discussed in detail with the patient.   A list for source of those nutrients were also provided to the patient.  Patient will use only water or unsweetened tea for hydration. B.  The need to stay away from risky substances including alcohol, smoking; obtaining 7 to 9 hours of restorative sleep, at least 150 minutes of moderate intensity exercise weekly, the importance of healthy social connections,  and stress reduction techniques were discussed. C.  A full color page of  Calorie density of various food groups per pound showing examples of each food groups was provided to the patient.  - I have counseled him on diet and weight management by adopting a carbohydrate restricted/protein rich diet. Patient is encouraged to switch to unprocessed or minimally processed complex starch and increased protein intake (animal or plant source), fruits, and vegetables. -  he is advised to stick to a routine mealtimes to eat 3 meals a day and avoid unnecessary snacks (to snack only to correct hypoglycemia).   - he acknowledges that there is a room for improvement in his food and drink choices. - Suggestion is made for him to avoid simple  carbohydrates from his diet including Cakes, Sweet Desserts, Ice Cream, Soda (diet and regular), Sweet Tea, Candies, Chips, Cookies, Store Bought Juices, Alcohol in Excess of 1-2 drinks a day, Artificial Sweeteners, Coffee Creamer, and "Sugar-free" Products. This will help patient to have more stable blood glucose profile and potentially avoid unintended weight gain.  - I have approached him with the following individualized plan to manage his diabetes and patient agrees:   -he is encouraged to start monitoring glucose 2 times daily, before breakfast and before bed, to log their readings on the clinic sheets provided, and bring them to review at follow up appointment in 4 weeks.  I have sent in POGO meter for him given his chronic tremors and decreased hand dexterity to his local pharmacy so he can start monitoring glucose routinely.  I do not feel he would be able to manage traditional meter with strips.  - Adjustment parameters are given to him for hypo and hyperglycemia in writing. - he is encouraged to call clinic for blood glucose levels less than 70 or above 300 mg /dl. - he is advised to continue Farxiga 10 mg po daily, Trulicity 4.5 mg SQ weekly, lower his Glipizide  to 5 mg po twice daily with meals, adjust his Metformin to 500 mg ER daily with breakfast, therapeutically suitable for patient .  - Specific targets for  A1c; LDL, HDL, and Triglycerides were discussed with the patient.  2) Blood Pressure /Hypertension:  his blood pressure is controlled to target.   he is advised to continue his current medications including Norvasc 10 mg p.o. daily with breakfast and Lasix 20 mg po daily as needed for fluid.  3) Lipids/Hyperlipidemia:    Review of his recent lipid panel from 12/01/22 showed controlled LDL at 84 .  he is advised to continue Crestor 10 mg daily at bedtime.  Side effects and precautions discussed with him.  4)  Weight/Diet:  his Body mass index is 27.5 kg/m.  -   he is NOT a  candidate for weight loss.  Exercise, and detailed carbohydrates information provided  -  detailed on discharge instructions.  5) Chronic Care/Health Maintenance: -he is not on ACEI/ARB and is on Statin medications and is encouraged to initiate and continue to follow up with Ophthalmology, Dentist, Podiatrist at least yearly or according to recommendations, and advised to stay away from smoking. I have recommended yearly flu vaccine and pneumonia vaccine at least every 5 years; moderate intensity exercise for up to 150 minutes weekly; and sleep for at least 7 hours a day.  - he is advised to maintain close follow up with Donetta Potts, MD for primary care needs, as well as his other providers for optimal and coordinated care.   - Time spent in this patient care: 60 min, of which > 50% was spent in counseling him about his diabetes and the rest reviewing his blood glucose logs, discussing his hypoglycemia and hyperglycemia episodes, reviewing his current and previous labs/studies (including abstraction from other facilities) and medications doses and developing a long term treatment plan based on the latest standards of care/guidelines; and documenting his care.    Please refer to Patient Instructions for Blood Glucose Monitoring and Insulin/Medications Dosing Guide" in media tab for additional information. Please also refer to "Patient Self Inventory" in the Media tab for reviewed elements of pertinent patient history.  [name] participated in the discussions, expressed understanding, and voiced agreement with the above plans.  All questions were answered to his satisfaction. he is encouraged to contact clinic should he have any questions or concerns prior to his return visit.     Follow up plan: - Return in about 4 weeks (around 02/09/2023) for No previsit labs, Diabetes F/U, Bring meter and logs.    Ronny Bacon, [facility] [address] [place], [place] [address] Phone: [phone] Fax: [phone]  01/12/2023, 11:19 AM

## 2023-01-16 ENCOUNTER — Ambulatory Visit (INDEPENDENT_AMBULATORY_CARE_PROVIDER_SITE_OTHER): Payer: Medicare Other | Admitting: Podiatry

## 2023-01-16 ENCOUNTER — Encounter: Payer: Self-pay | Admitting: Podiatry

## 2023-01-16 DIAGNOSIS — E08621 Diabetes mellitus due to underlying condition with foot ulcer: Secondary | ICD-10-CM | POA: Diagnosis not present

## 2023-01-16 DIAGNOSIS — L97513 Non-pressure chronic ulcer of other part of right foot with necrosis of muscle: Secondary | ICD-10-CM

## 2023-01-16 MED ORDER — DOXYCYCLINE HYCLATE 100 MG PO TABS: 100.0000 mg | ORAL_TABLET | Freq: Two times a day (BID) | ORAL | 0 refills | Status: AC

## 2023-01-16 NOTE — Progress Notes (Signed)
Subjective:  Patient ID: [name], male    DOB: 1950-12-21,   MRN: [id number]  Chief Complaint  Patient presents with   Wound Check    Wound check - some discharge no swelling    72 y.o. male presents for follow-up of right second toe wound and ingrown right great toe. Relates worse than from previous visit. Underwent angio and optimized no revascularization was required.  .He has been keeping betadine on the toe. Relates the ingrown toenail area is doing well and not bothering him.  Denies any other pedal complaints. Denies n/v/f/c.    Patient is diabetic and last A1c was  Lab Results  Component Value Date   HGBA1C 8.4 (H) 11/30/2022   .   PCP:  Donetta Potts, MD     Past Medical History:  Diagnosis Date   A-fib [facility])    CAD (coronary artery disease)    Hypertension    Obstructive sleep apnea    CPAP   Rosacea    Type 2 diabetes mellitus with hyperglycemia (HCC)     Objective:  Physical Exam: Vascular: DP/PT pulses 2/4 bilateral. CFT <3 seconds. Normal hair growth on digits. No edema.  Skin. No lacerations or abrasions bilateral feet. Right second digit dorsal wound with fibrosis and eschar overlying with surrounding erythema. No purulence noted. No probe to bone noted. Minimal erythema surrounding toe and much improved from prior. Wound measures 3 cm x 1.5 cm x 0.2 cm upon debridement. Right great toe lateral border nail incurvated with no erythema edema or purulence noted.  Musculoskeletal: MMT 5/5 bilateral lower extremities in DF, PF, Inversion and Eversion. Deceased ROM in DF of ankle joint.  Neurological: Sensation intact to light touch.   ABIs:  IMPRESSION: Nondiagnostic ankle brachial indices due to noncompressible arteries at the level of the ankle. Lower extremity arterial duplex, CTA runoff, or direct lower extremity angiography could be considered for further characterization as clinically indicated.    Assessment:   1. 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 Ulcer right second digit with unstageable ulcer with cellulitis  -Debridement as below  -X-rays reviewed No obvious osseous erosions noted.  -Dressed with betadine, DSD. -Off-loading with surgical shoe. Discussed wearing the shoe as patient in regular shoes today.  -Doxycycline sent to pharmacy for concern of exposed bone.  -ABIs reviewed and abnormal. Underwent angio  Recommendations: The patient has inline flow to the right toes via the posterior tibial artery.  No indication for revascularization.  Continue medical therapy. -Discussed glucose control and proper protein-rich diet.  -Discussed if any worsening redness, pain, fever or chills to call or may need to report to the emergency room. Patient expressed understanding.  -Discussed and educated patient on diabetic foot care, especially with  regards to the vascular, neurological and musculoskeletal systems.  -Stressed the importance of good glycemic control and the detriment of not  controlling glucose levels in relation to the foot. -Discussed supportive shoes at all times and checking feet regularly.   Discussed if we continue to struggle wound consider wound care and hyperbaric oxygen therapy.    Procedure: Excisional Debridement of Wound Rationale: Removal of non-viable soft tissue from the wound to promote healing.  Anesthesia: none Pre-Debridement Wound Measurements: Overlying necrotic tissue  Post-Debridement Wound Measurements: 3 cm  x 1.5 cm x 0.2 cm  Type of Debridement: Sharp Excisional Tissue Removed: Non-viable soft tissue Depth of Debridement: subcutaneous tissue. Technique: Sharp excisional debridement to bleeding, viable wound base.  Dressing: Dry, sterile, compression dressing. Disposition: Patient tolerated procedure  well. Patient to return in 2 week for follow-up.  No follow-ups on file.    No follow-ups on file.   Louann Sjogren, DPM

## 2023-01-17 ENCOUNTER — Other Ambulatory Visit (HOSPITAL_COMMUNITY): Payer: Self-pay

## 2023-01-17 NOTE — Telephone Encounter (Signed)
Patient's wife is calling back to check the status or maybe have an alternative?

## 2023-01-17 NOTE — Telephone Encounter (Signed)
Per test claim:    POGO not covered under part D coverage.   Pa not submitted at this time

## 2023-01-17 NOTE — Telephone Encounter (Signed)
Pogo is not covered for him.  So I am trying Dexcom G7 through [employer] on the grounds he is unable to perform traditional fingersticks due to decreased dexterity in hands associated with hand tremors.

## 2023-01-17 NOTE — Telephone Encounter (Signed)
I just sent PA team a message to follow up as I requested that a PA be done for him.

## 2023-01-17 NOTE — Telephone Encounter (Signed)
Can we please check on/initiate PA for POGO?  Patient has essential tremors and decreased dexterity, limiting him from using standard meter with strips.

## 2023-01-30 ENCOUNTER — Other Ambulatory Visit: Payer: Self-pay | Admitting: Podiatry

## 2023-01-30 ENCOUNTER — Ambulatory Visit (INDEPENDENT_AMBULATORY_CARE_PROVIDER_SITE_OTHER): Payer: Medicare Other | Admitting: Podiatry

## 2023-01-30 ENCOUNTER — Ambulatory Visit
Admission: RE | Admit: 2023-01-30 | Discharge: 2023-01-30 | Disposition: A | Discharge status: Home / Self Care | Payer: Medicare Other | Source: Ambulatory Visit | Attending: Podiatry | Admitting: Podiatry

## 2023-01-30 ENCOUNTER — Ambulatory Visit (INDEPENDENT_AMBULATORY_CARE_PROVIDER_SITE_OTHER): Payer: Medicare Other

## 2023-01-30 ENCOUNTER — Encounter: Payer: Self-pay | Admitting: Podiatry

## 2023-01-30 DIAGNOSIS — M86171 Other acute osteomyelitis, right ankle and foot: Secondary | ICD-10-CM | POA: Diagnosis not present

## 2023-01-30 DIAGNOSIS — E08621 Diabetes mellitus due to underlying condition with foot ulcer: Secondary | ICD-10-CM | POA: Diagnosis not present

## 2023-01-30 DIAGNOSIS — M85871 Other specified disorders of bone density and structure, right ankle and foot: Secondary | ICD-10-CM | POA: Diagnosis not present

## 2023-01-30 DIAGNOSIS — Z5189 Encounter for other specified aftercare: Secondary | ICD-10-CM

## 2023-01-30 DIAGNOSIS — R2241 Localized swelling, mass and lump, right lower limb: Secondary | ICD-10-CM | POA: Diagnosis not present

## 2023-01-30 DIAGNOSIS — L97514 Non-pressure chronic ulcer of other part of right foot with necrosis of bone: Secondary | ICD-10-CM | POA: Diagnosis not present

## 2023-01-30 MED ORDER — DOXYCYCLINE HYCLATE 100 MG PO TABS: 100.0000 mg | ORAL_TABLET | Freq: Two times a day (BID) | ORAL | 0 refills | Status: AC

## 2023-01-30 NOTE — Progress Notes (Signed)
Subjective:  Patient ID: [name], male    DOB: 1951/08/14,   MRN: [id number]  Chief Complaint  Patient presents with   Wound Check    Patient states wound is getting worse    72 y.o. male presents for follow-up of right second toe wound and ingrown right great toe. Relates worse than from previous visit. Underwent angio and optimized no revascularization was required.  .He has been keeping betadine on the toe. Relates the ingrown toenail area is doing well and not bothering him.  Denies any other pedal complaints. Denies n/v/f/c.    Patient is diabetic and last A1c was  Lab Results  Component Value Date   HGBA1C 8.4 (H) 11/30/2022   .   PCP:  Donetta Potts, MD     Past Medical History:  Diagnosis Date   A-fib [facility])    CAD (coronary artery disease)    Hypertension    Obstructive sleep apnea    CPAP   Rosacea    Type 2 diabetes mellitus with hyperglycemia (HCC)     Objective:  Physical Exam: Vascular: DP/PT pulses 2/4 bilateral. CFT <3 seconds. Normal hair growth on digits. No edema.  Skin. No lacerations or abrasions bilateral feet. Right second digit dorsal wound with fibrosis and eschar overlying with surrounding erythema. No purulence noted. No probe to bone noted. Minimal erythema surrounding toe and much improved from prior. Wound measures 3 cm x 1.5 cm x 0.2 cm upon debridement. Right great toe lateral border nail incurvated with no erythema edema or purulence noted.  Musculoskeletal: MMT 5/5 bilateral lower extremities in DF, PF, Inversion and Eversion. Deceased ROM in DF of ankle joint.  Neurological: Sensation intact to light touch.   ABIs:  IMPRESSION: Nondiagnostic ankle brachial indices due to noncompressible arteries at the level of the ankle. Lower extremity arterial duplex, CTA runoff, or direct lower extremity angiography could be considered for further characterization as clinically indicated.    Assessment:   1. 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 Wound check, abscess            Plan:  Patient was evaluated and treated and all questions answered. Ulcer right second digit with necrosis of bone ulcer with cellulitis  -Debridement as below  -X-rays reviewed Medial distal proximal phalanx of second digit with some erosion noted concerning for osteomyelitis.  -Dressed with betadine, DSD. -Off-loading with surgical shoe.  -Doxycycline sent to pharmacy for concern of exposed bone.  -ABIs reviewed and abnormal. Underwent angio  Recommendations: -Wound culture obtained.  -MRI ordered. Discussed suspected osteomyelitis and concern that toe may need to be amputated. Patient expressed understainding.  The patient has inline flow to the right toes via the posterior tibial artery.  No indication for revascularization.  Continue medical therapy. -Discussed glucose control and proper protein-rich diet.  -Discussed if any worsening redness, pain, fever or chills to call or may need to report to the emergency room. Patient expressed understanding.  -Discussed and educated patient on diabetic foot care, especially with  regards to the vascular, neurological and musculoskeletal systems.  -Stressed the importance of good glycemic control and the detriment of not  controlling glucose levels in relation to the foot. -Discussed supportive shoes at all times and checking feet regularly.   Discussed if we continue to struggle and no bone infection would consider wound care and hyperbaric oxygen therapy.  Procedure: Excisional Debridement of Wound Rationale: Removal of non-viable soft tissue from the wound to promote healing.  Anesthesia: none Pre-Debridement Wound Measurements: Overlying necrotic tissue  Post-Debridement Wound Measurements: 3 cm x 1.5 cm x 0.2 cm  Type of Debridement: Sharp Excisional Tissue Removed: Non-viable soft  tissue Depth of Debridement: subcutaneous tissue. Technique: Sharp excisional debridement to bleeding, viable wound base.  Dressing: Dry, sterile, compression dressing. Disposition: Patient tolerated procedure well. Patient to return in 1 week for follow-up.  Return in about 1 week (around 02/06/2023) for wound check.    Return in about 1 week (around 02/06/2023) for wound check.   Louann Sjogren, DPM

## 2023-01-31 ENCOUNTER — Telehealth: Payer: Self-pay | Admitting: Podiatry

## 2023-01-31 NOTE — Telephone Encounter (Signed)
-----   Message from Louann Sjogren, DPM sent at 01/31/2023  8:23 AM EDT ----- Patient with bone infection on MRI. I would like to get him to be seen possibly tomorrow or some time this week so we can get surgery set up for him. We can add him on whenever is convenient for them. Thanks

## 2023-01-31 NOTE — Progress Notes (Signed)
Patient with bone infection on MRI. I would like to get him to be seen possibly tomorrow or some time this week so we can get surgery set up for him. We can add him on whenever is convenient for them. Thanks

## 2023-01-31 NOTE — Telephone Encounter (Signed)
Thank you :)

## 2023-01-31 NOTE — Telephone Encounter (Signed)
Pt scheduled to see you tomorrow 5.22 at 815am.

## 2023-02-01 ENCOUNTER — Ambulatory Visit (INDEPENDENT_AMBULATORY_CARE_PROVIDER_SITE_OTHER): Payer: Medicare Other | Admitting: Podiatry

## 2023-02-01 ENCOUNTER — Encounter: Payer: Self-pay | Admitting: Podiatry

## 2023-02-01 ENCOUNTER — Telehealth: Payer: Self-pay | Admitting: Cardiology

## 2023-02-01 DIAGNOSIS — E08621 Diabetes mellitus due to underlying condition with foot ulcer: Secondary | ICD-10-CM

## 2023-02-01 DIAGNOSIS — L97514 Non-pressure chronic ulcer of other part of right foot with necrosis of bone: Secondary | ICD-10-CM | POA: Diagnosis not present

## 2023-02-01 NOTE — Progress Notes (Signed)
Subjective:  Patient ID: [name], male    DOB: 09-17-1950,   MRN: [id number]  Chief Complaint  Patient presents with   Foot Pain    Rm 21 Follow up right foot pain and results review for MRI. Pt states no change.     72 y.o. male presents for follow-up of right second toe wound and to reivew MRI results and discuss possible surgery. . Relates worse than from previous visit. Underwent angio and optimized no revascularization was required.  .He has been keeping betadine on the toe.  Denies any other pedal complaints. Denies n/v/f/c.    Patient is diabetic and last A1c was  Lab Results  Component Value Date   HGBA1C 8.4 (H) 11/30/2022   .   PCP:  Donetta Potts, MD     Past Medical History:  Diagnosis Date   A-fib [facility])    CAD (coronary artery disease)    Hypertension    Obstructive sleep apnea    CPAP   Rosacea    Type 2 diabetes mellitus with hyperglycemia (HCC)     Objective:  Physical Exam: Vascular: DP/PT pulses 2/4 bilateral. CFT <3 seconds. Normal hair growth on digits. No edema.  Skin. No lacerations or abrasions bilateral feet. Right second digit dorsal wound with fibrosis and eschar overlying with surrounding erythema. No purulence noted. No probe to bone noted. Minimal erythema surrounding toe and much improved from prior. Wound measures 3 cm x 1.5 cm x 0.2 cm upon debridement. Right great toe lateral border nail incurvated with no erythema edema or purulence noted.  Musculoskeletal: MMT 5/5 bilateral lower extremities in DF, PF, Inversion and Eversion. Deceased ROM in DF of ankle joint.  Neurological: Sensation intact to light touch.   ABIs:  IMPRESSION: Nondiagnostic ankle brachial indices due to noncompressible arteries at the level of the ankle. Lower extremity arterial duplex, CTA runoff, or direct lower extremity angiography could be considered for further characterization as clinically indicated.    Assessment:   1. 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 2. Diffuse edema-like signal of the foot musculature which may be related to denervation or myositis. 3. Generalized subcutaneous edema. No drainable fluid collection.       Plan:  Patient was evaluated and treated and all questions answered. Ulcer right second digit with necrosis of bone ulcer with cellulitis  -MRI reivewed with patient and discussed prescenece of osteomyelitis and need for amputation of the right second toe. Patient expressed understanding.  -Informed surgical risk consent was reviewed and read aloud to the patient.  I reviewed the films.  I have discussed my findings with the patient in great detail.  I have discussed all risks including but not limited to infection, stiffness, scarring, limp, disability, deformity, damage to blood vessels and nerves, numbness, poor healing, need for braces, arthritis, chronic pain, amputation, death.  All benefits and realistic expectations discussed in great detail.  I have made no promises as to the outcome.  I have provided realistic expectations.  I have offered the patient a 2nd opinion, which they have declined and assured me they preferred to proceed despite the risks. -Dressed with betadine, DSD. -Off-loading with surgical shoe.  -Continue doxycyline for time being.  -ABIs reviewed and abnormal. Underwent angio  Awaiting  wound culture results.   -Discussed glucose control and proper protein-rich diet.  -Discussed if any worsening redness, pain, fever or chills to call or may need to report to the emergency room. Patient expressed understanding.  Patient may continue on eliquis.  Will plan for surgery 5/28.    Return if symptoms worsen or fail to improve.    Return if symptoms worsen or fail to  improve.   Louann Sjogren, DPM

## 2023-02-01 NOTE — Telephone Encounter (Signed)
   [facility] Pre-operative Risk Assessment    Request for surgical clearance:  What type of surgery is being performed?  Amputation of Send Toe, Right Foot   When is this surgery scheduled?  02/07/23   What type of clearance is required (medical clearance vs. Pharmacy clearance to hold med vs. Both)?  Both   Are there any medications that need to be held prior to surgery and how long? Their office would like our advisement regarding medications   Practice name and name of physician performing surgery?  [facility]  Dr. Kriste Basque  What is your office phone number? [phone]    7.   What is your office fax number? [phone]  8.   Anesthesia type (None, local, MAC, general)? Mac    [name] 02/01/2023, 9:47 AM

## 2023-02-02 NOTE — Telephone Encounter (Signed)
   Patient Name: [name]  DOB: October 01, 1950 MRN: [id number]  Primary Cardiologist: Rollene Rotunda, MD  Chart reviewed as part of pre-operative protocol coverage. Given past medical history and time since last visit, based on ACC/AHA guidelines, Arris R Balthaser is at acceptable risk for the planned procedure without further cardiovascular testing.   The patient was advised that if he develops new symptoms prior to surgery to contact our office to arrange for a follow-up visit, and he verbalized understanding.  Per office protocol, patient can hold Eliquis for 2 days prior to procedure.    I will route this recommendation to the requesting party via Epic fax function and remove from pre-op pool.  Please call with questions.  Napoleon Form, Leodis Rains, NP 02/02/2023, 8:57 AM

## 2023-02-02 NOTE — Telephone Encounter (Signed)
Patient with diagnosis of afib on Eliquis for anticoagulation.    Procedure: Amputation of Send Toe, Right Foot  Date of procedure: 02/07/23   CHA2DS2-VASc Score = 5   This indicates a 7.2% annual risk of stroke. The patient's score is based upon: CHF History: 1 HTN History: 1 Diabetes History: 1 Stroke History: 0 Vascular Disease History: 1 Age Score: 1 Gender Score: 0      CrCl 57 ml/min  Per office protocol, patient can hold Eliquis for 2 days prior to procedure.    **This guidance is not considered finalized until pre-operative APP has relayed final recommendations.**

## 2023-02-07 ENCOUNTER — Other Ambulatory Visit: Payer: Self-pay | Admitting: Podiatry

## 2023-02-07 DIAGNOSIS — M86671 Other chronic osteomyelitis, right ankle and foot: Secondary | ICD-10-CM | POA: Diagnosis not present

## 2023-02-07 DIAGNOSIS — L97514 Non-pressure chronic ulcer of other part of right foot with necrosis of bone: Secondary | ICD-10-CM | POA: Diagnosis not present

## 2023-02-07 DIAGNOSIS — M869 Osteomyelitis, unspecified: Secondary | ICD-10-CM | POA: Diagnosis not present

## 2023-02-07 DIAGNOSIS — M86171 Other acute osteomyelitis, right ankle and foot: Secondary | ICD-10-CM | POA: Diagnosis not present

## 2023-02-07 MED ORDER — ONDANSETRON HCL 4 MG PO TABS: 4.0000 mg | ORAL_TABLET | Freq: Three times a day (TID) | ORAL | 0 refills | Status: DC | PRN

## 2023-02-07 MED ORDER — OXYCODONE-ACETAMINOPHEN 5-325 MG PO TABS: 1.0000 | ORAL_TABLET | ORAL | 0 refills | Status: AC | PRN

## 2023-02-08 LAB — WOUND CULTURE
MICRO NUMBER:: 14979161
SPECIMEN QUALITY:: ADEQUATE

## 2023-02-08 LAB — DUPLICATE REPORT

## 2023-02-08 LAB — HOUSE ACCOUNT TRACKING

## 2023-02-09 DIAGNOSIS — E113313 Type 2 diabetes mellitus with moderate nonproliferative diabetic retinopathy with macular edema, bilateral: Secondary | ICD-10-CM | POA: Diagnosis not present

## 2023-02-11 ENCOUNTER — Other Ambulatory Visit: Payer: Self-pay | Admitting: Cardiovascular Disease

## 2023-02-13 ENCOUNTER — Ambulatory Visit (INDEPENDENT_AMBULATORY_CARE_PROVIDER_SITE_OTHER): Payer: Medicare Other | Admitting: Podiatry

## 2023-02-13 ENCOUNTER — Encounter: Payer: Self-pay | Admitting: Podiatry

## 2023-02-13 ENCOUNTER — Ambulatory Visit: Payer: Medicare Other | Admitting: Podiatry

## 2023-02-13 DIAGNOSIS — Z9889 Other specified postprocedural states: Secondary | ICD-10-CM

## 2023-02-13 NOTE — Progress Notes (Signed)
Subjective:  Patient ID: [name], male    DOB: 12/28/1950,  MRN: [id number]  No chief complaint on file.   DOS: 02/07/23 Procedure: Right second digit partial amputation  72 y.o. male returns for POV#1. Doing well with minimal pain  Review of Systems: Negative except as noted in the HPI. Denies N/V/F/Ch.  Past Medical History:  Diagnosis Date   A-fib (HCC)    CAD (coronary artery disease)    Hypertension    Obstructive sleep apnea    CPAP   Rosacea    Type 2 diabetes mellitus with hyperglycemia (HCC)     Current Outpatient Medications:    ondansetron (ZOFRAN) 4 MG tablet, Take 1 tablet (4 mg total) by mouth every 8 (eight) hours as needed for nausea or vomiting., Disp: 20 tablet, Rfl: 0   amLODipine (NORVASC) 10 MG tablet, Take 10 mg by mouth daily., Disp: , Rfl:    aspirin EC 81 MG tablet, Take 1 tablet (81 mg total) by mouth daily. Swallow whole., Disp: 90 tablet, Rfl: 3   Blood Glucose Monitoring Suppl (POGO AUTOMATIC BLOOD GLUCOSE) DEVI, Use to check glucose twice daily, Disp: 1 each, Rfl: 0   dapagliflozin propanediol (FARXIGA) 10 MG TABS tablet, Take 1 tablet (10 mg total) by mouth daily., Disp: 30 tablet, Rfl: 5   doxycycline (VIBRA-TABS) 100 MG tablet, Take 1 tablet (100 mg total) by mouth 2 (two) times daily for 14 days., Disp: 28 tablet, Rfl: 0   Dulaglutide (TRULICITY) 4.5 MG/0.5ML SOPN, Inject 4.5 mg into the skin once a week., Disp: , Rfl:    ELIQUIS 5 MG TABS tablet, TAKE 1 TABLET BY MOUTH TWICE A DAY, Disp: 60 tablet, Rfl: 5   ergocalciferol (VITAMIN D2) 1.25 MG (50000 UT) capsule, Take 50,000 Units by mouth every Saturday., Disp: , Rfl:    furosemide (LASIX) 20 MG tablet, TAKE 1 TABLET BY MOUTH DAILY AS NEEDED (WEIGHT GAIN OF 3-5LB OR INCREASED SHORTNESS OF BREATH)., Disp: 90 tablet, Rfl: 1   glipiZIDE (GLUCOTROL) 10 MG tablet, Take 5 mg by mouth 2 (two) times daily before a meal., Disp: , Rfl:    metFORMIN (GLUCOPHAGE-XR) 500 MG 24 hr tablet, Take 500 mg  by mouth daily with breakfast., Disp: , Rfl:    POGO AUTOMATIC TEST CARTRIDGES TEST, USE TO CHECK GLUCOSE TWICE DAILY, Disp: 50 each, Rfl: 3   rosuvastatin (CRESTOR) 10 MG tablet, Take 10 mg by mouth See admin instructions. Takes 10 mg on Mondays and Fridays, Disp: , Rfl:    triamcinolone cream (KENALOG) 0.1 %, Apply 1 Application topically 2 (two) times daily as needed (dry skin)., Disp: , Rfl:   Social History   Tobacco Use  Smoking Status Never  Smokeless Tobacco Current   Types: Snuff  Tobacco Comments   Uses Snus     No Known Allergies Objective:  There were no vitals filed for this visit. There is no height or weight on file to calculate BMI. Constitutional Well developed. Well nourished.  Vascular Foot warm and well perfused. Capillary refill normal to all digits.   Neurologic Normal speech. Oriented to person, place, and time. Epicritic sensation to light touch grossly present bilaterally.  Dermatologic Skin healing well without signs of infection. Skin edges well coapted without signs of infection.  Orthopedic: Tenderness to palpation noted about the surgical site.    Assessment:  No diagnosis found. Plan:  Patient was evaluated and treated and all questions answered.  S/p foot surgery right -Progressing as expected post-operatively. -  WB Status: WBAT in surgical shoe -Sutures: intact. -Medications: n/a -Foot redressed.  Follow-up in 2 weeks for suture removal   No follow-ups on file.

## 2023-02-13 NOTE — Telephone Encounter (Addendum)
Prescription refill request for Eliquis received.  Indication: afib  Last office visit: meng, 01/03/2023 Scr: 1.40, 12/13/2022 Age: 72 yo  Weight: 84.5 kg   Refill sent.

## 2023-02-17 ENCOUNTER — Ambulatory Visit (INDEPENDENT_AMBULATORY_CARE_PROVIDER_SITE_OTHER): Payer: Medicare Other | Admitting: Nurse Practitioner

## 2023-02-17 ENCOUNTER — Encounter: Payer: Self-pay | Admitting: Nurse Practitioner

## 2023-02-17 VITALS — BP 140/70 | HR 90 | Ht 69.0 in | Wt 181.4 lb

## 2023-02-17 DIAGNOSIS — Z7985 Long-term (current) use of injectable non-insulin antidiabetic drugs: Secondary | ICD-10-CM

## 2023-02-17 DIAGNOSIS — Z7984 Long term (current) use of oral hypoglycemic drugs: Secondary | ICD-10-CM

## 2023-02-17 DIAGNOSIS — E1165 Type 2 diabetes mellitus with hyperglycemia: Secondary | ICD-10-CM | POA: Diagnosis not present

## 2023-02-17 NOTE — Patient Instructions (Signed)

## 2023-02-17 NOTE — Progress Notes (Signed)
Endocrinology Follow Up Note       02/17/2023, 8:54 AM   Subjective:    Patient ID: [name], male    DOB: [birth year].  [name] is being seen in follow up after being seen in consultation for management of currently uncontrolled symptomatic diabetes requested by  Donetta Potts, MD.   Past Medical History:  Diagnosis Date   A-fib [facility])    CAD (coronary artery disease)    Hypertension    Obstructive sleep apnea    CPAP   Rosacea    Type 2 diabetes mellitus with hyperglycemia [facility])     Past Surgical History:  Procedure Laterality Date   ABDOMINAL AORTOGRAM W/LOWER EXTREMITY Right 11/30/2022   Procedure: ABDOMINAL AORTOGRAM W/LOWER EXTREMITY;  Surgeon: Iran Ouch, MD;  Location: [facility] INVASIVE CV [facility];  Service: Cardiovascular;  Laterality: Right;   CORONARY ARTERY BYPASS GRAFT     LIMA to the LAD, sequential SVG to ramus intermediate and OM1, sequential SVG to PDA and posterior lateral   RIGHT/LEFT HEART CATH AND CORONARY/GRAFT ANGIOGRAPHY N/A 01/21/2021   Procedure: RIGHT/LEFT HEART CATH AND CORONARY/GRAFT ANGIOGRAPHY;  Surgeon: Runell Gess, MD;  Location: [facility] INVAS[facility] CV [facility];  Service: Cardiovascular;  Laterality: N/A;    Social History   Socioeconomic History   Marital status: Married    Spouse name: Production designer, theatre/television/film   Number of children: 3   Years of education: Not on file   Highest education level: Not on file  Occupational History   Not on file  Tobacco Use   Smoking status: Never   Smokeless tobacco: Current    Types: Snuff   Tobacco comments:    Uses Snus   Vaping Use   Vaping Use: Never used  Substance and Sexual Activity   Alcohol use: Yes    Comment: 1-2 beers a day   Drug use: Never   Sexual activity: Not on file  Other Topics Concern   Not on file  Social History Narrative   Lives with wife and two grandchildren.     Lives in a one story home    Right  Handed    Drinks little caffeine    Social Determinants of Health   Financial Resource Strain: Not on file  Food Insecurity: Not on file  Transportation Needs: Not on file  Physical Activity: Not on file  Stress: Not on file  Social Connections: Not on file    Family History  Problem Relation Age of Onset   Heart disease Mother 3   Diabetes Mother    Breast cancer Mother    Heart attack Mother    Brain cancer Mother    Heart disease Father    Kidney cancer Father        died from cancer   Heart attack Father 44   Cancer - Other Father        Spinal Cancer   Migraines Sister    Fibromyalgia Sister    Heart Problems Sister        Heart problems after birth of child   CAD Brother 35       CABG   Diabetes Brother  Outpatient Encounter Medications as of 02/17/2023  Medication Sig   amLODipine (NORVASC) 10 MG tablet Take 10 mg by mouth daily.   aspirin EC 81 MG tablet Take 1 tablet (81 mg total) by mouth daily. Swallow whole.   dapagliflozin propanediol (FARXIGA) 10 MG TABS tablet Take 1 tablet (10 mg total) by mouth daily.   Dulaglutide (TRULICITY) 4.5 MG/0.5ML SOPN Inject 4.5 mg into the skin once a week.   ELIQUIS 5 MG TABS tablet TAKE 1 TABLET BY MOUTH TWICE A DAY   ergocalciferol (VITAMIN D2) 1.25 MG (50000 UT) capsule Take 50,000 Units by mouth every Saturday.   furosemide (LASIX) 20 MG tablet TAKE 1 TABLET BY MOUTH DAILY AS NEEDED (WEIGHT GAIN OF 3-5LB OR INCREASED SHORTNESS OF BREATH).   glipiZIDE (GLUCOTROL) 10 MG tablet Take 5 mg by mouth 2 (two) times daily before a meal.   hydrochlorothiazide (HYDRODIURIL) 12.5 MG tablet Take 12.5 mg by mouth daily.   metFORMIN (GLUCOPHAGE-XR) 500 MG 24 hr tablet Take 500 mg by mouth daily with breakfast.   ondansetron (ZOFRAN) 4 MG tablet Take 1 tablet (4 mg total) by mouth every 8 (eight) hours as needed for nausea or vomiting.   rosuvastatin (CRESTOR) 10 MG tablet Take 10 mg by mouth See admin instructions. Takes 10 mg on  Mondays and Fridays   triamcinolone cream (KENALOG) 0.1 % Apply 1 Application topically 2 (two) times daily as needed (dry skin).   [DISCONTINUED] Blood Glucose Monitoring Suppl (POGO AUTOMATIC BLOOD GLUCOSE) DEVI Use to check glucose twice daily   [DISCONTINUED] POGO AUTOMATIC TEST CARTRIDGES TEST USE TO CHECK GLUCOSE TWICE DAILY   No facility-administered encounter medications on file as of 02/17/2023.    ALLERGIES: No Known Allergies  VACCINATION STATUS: Immunization History  Administered Date(s) Administered   Td (Adult),5 Lf Tetanus Toxid, Preservative Free 07/19/2010    Diabetes He presents for his follow-up diabetic visit. He has type 2 diabetes mellitus. Onset time: diagnosed at approx age of 57. His disease course has been improving. There are no hypoglycemic associated symptoms. Associated symptoms include blurred vision, foot ulcerations and polyuria. There are no hypoglycemic complications. Symptoms are improving. Diabetic complications include heart disease (MI when he was 49), nephropathy, peripheral neuropathy, PVD and retinopathy. Risk factors for coronary artery disease include diabetes mellitus, dyslipidemia, family history, hypertension, male sex, obesity and sedentary lifestyle. Current diabetic treatment includes oral agent (triple therapy) (and Trulicity). He is compliant with treatment most of the time. His weight is decreasing steadily. He is following a generally healthy diet. When asked about meal planning, he reported none. He has not had a previous visit with a dietitian. He rarely participates in exercise. His home blood glucose trend is decreasing steadily. (He presents today, accompanied by his wife, with his CGM (showing gap in recordings).  He notes he had partial toe amputation around [facility] day and had to stop his Trulicity prior to the procedure.  He has since resumed it.  He denies any significant hypoglycemia, did have one incidence where glucose dropped into  the 60s.  His insurance denied him POGO meter but did approve for him to have CGM given dexterity issues.  He continues to work on healthy dietary changes and has lost a few pounds in the process.) An ACE inhibitor/angiotensin II receptor blocker is not being taken. He sees a podiatrist.Eye exam is current.     Review of systems  Constitutional: + decreasing body weight, current Body mass index is 26.79 kg/m., no fatigue, no  subjective hyperthermia, no subjective hypothermia Eyes: + blurry vision (hx retinopathy), no xerophthalmia ENT: no sore throat, no nodules palpated in throat, no dysphagia/odynophagia, no hoarseness Cardiovascular: no chest pain, no shortness of breath, + intermittent palpitations-hx afib, no leg swelling Respiratory: no cough, no shortness of breath Gastrointestinal: no nausea/vomiting/diarrhea Musculoskeletal: no muscle/joint aches, walks with cane for frequent falls Skin: no rashes, no hyperemia, + chronic DM foot ulcer treated by [facility]- 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 Neurological: + chronic hand tremors with decreased dexterity, no numbness, no tingling, no dizziness Psychiatric: no depression, no anxiety  Objective:     BP (!) 140/70 (BP Location: Left Arm, Patient Position: Sitting, Cuff Size: Large) Comment: Retake manuel cuff  Pulse 90   Ht 5\' 9"  (1.753 m)   Wt 181 lb 6.4 oz (82.3 kg)   BMI 26.79 kg/m   Wt Readings from Last 3 Encounters:  02/17/23 181 lb 6.4 oz (82.3 kg)  01/12/23 186 lb 3.2 oz (84.5 kg)  01/03/23 186 lb 9.6 oz (84.6 kg)     BP Readings from Last 3 Encounters:  02/17/23 (!) 140/70  01/12/23 126/84  01/03/23 (!) 150/64     Physical Exam- Limited  Constitutional:  Body mass index is 26.79 kg/m. , not in acute distress, normal state of mind Eyes:  EOMI, no exophthalmos Musculoskeletal: walks with cane- has frequent falls Skin:  no rashes, no hyperemia    Diabetic Foot Exam - Simple   No data filed      CMP ( most recent) CMP     Component Value Date/Time   NA 138 12/13/2022 0943   K 5.0 12/13/2022 0943   CL 98 12/13/2022 0943   CO2 22 12/13/2022 0943   GLUCOSE 303 (H) 12/13/2022 0943   GLUCOSE 100 (H) 12/02/2022 0116   BUN 14 12/13/2022 0943   CREATININE 1.40 (H) 12/13/2022 0943   CALCIUM 10.0 12/13/2022 0943   PROT 7.2 01/19/2021 2031   ALBUMIN 3.7 01/19/2021 2031   AST 17 01/19/2021 2031   ALT 19 01/19/2021 2031   ALKPHOS 91 01/19/2021 2031   BILITOT 1.2 01/19/2021 2031   GFRNONAA 50 (L) 12/02/2022 0116   GFRAA  07/24/2007 1130    >60        The eGFR has been calculated using the MDRD equation. This calculation has not been validated in all clinical     Diabetic Labs (most recent): Lab Results  Component Value Date   HGBA1C 8.4 (H) 11/30/2022   HGBA1C 7.8 (H) 01/19/2021     Lipid Panel ( most recent) Lipid Panel     Component Value Date/Time   CHOL 138 12/01/2022 0446   TRIG 74 12/01/2022 0446   HDL 39 (L) 12/01/2022 0446   CHOLHDL 3.5 12/01/2022 0446   VLDL 15 12/01/2022 0446   LDLCALC 84 12/01/2022 0446      Lab Results  Component Value Date   TSH 2.490 12/01/2022           Assessment & Plan:   1) Type 2 diabetes mellitus with hyperglycemia, without long-term current use of insulin (HCC)  He presents today, accompanied by his wife, with his CGM (showing gap in recordings).  He notes he had partial toe amputation around [facility] day and had to stop his Trulicity prior to the procedure.  He has since resumed it.  He denies any significant hypoglycemia, did have one incidence where glucose dropped into the 60s.  His insurance denied him POGO meter  but did approve for him to have CGM given dexterity issues.  He continues to work on healthy dietary changes and has lost a few pounds in the process.  - [name] has currently uncontrolled symptomatic type 2 DM since 72 years of age, with most recent A1c of 8.4 %.   -Recent labs  reviewed.  - I had a long discussion with him about the progressive nature of diabetes and the pathology behind its complications. -his diabetes is complicated by CAD, CHF, PAD, CKD stage 3a, DM foot ulcers and he remains at a high risk for more acute and chronic complications which include CAD, CVA, CKD, retinopathy, and neuropathy. These are all discussed in detail with him.  The following Lifestyle Medicine recommendations according to American College of Lifestyle Medicine [facility]) were discussed and offered to patient and he agrees to start the journey:  A. Whole Foods, Plant-based plate comprising of fruits and vegetables, plant-based proteins, whole-grain carbohydrates was discussed in detail with the patient.   A list for source of those nutrients were also provided to the patient.  Patient will use only water or unsweetened tea for hydration. B.  The need to stay away from risky substances including alcohol, smoking; obtaining 7 to 9 hours of restorative sleep, at least 150 minutes of moderate intensity exercise weekly, the importance of healthy social connections,  and stress reduction techniques were discussed. C.  A full color page of  Calorie density of various food groups per pound showing examples of each food groups was provided to the patient.  - Nutritional counseling repeated at each appointment due to patients tendency to fall back in to old habits.  - The patient admits there is a room for improvement in their diet and drink choices. -  Suggestion is made for the patient to avoid simple carbohydrates from their diet including Cakes, Sweet Desserts / Pastries, Ice Cream, Soda (diet and regular), Sweet Tea, Candies, Chips, Cookies, Sweet Pastries, Store Bought Juices, Alcohol in Excess of 1-2 drinks a day, Artificial Sweeteners, Coffee Creamer, and "Sugar-free" Products. This will help patient to have stable blood glucose profile and potentially avoid unintended weight gain.   - I  encouraged the patient to switch to unprocessed or minimally processed complex starch and increased protein intake (animal or plant source), fruits, and vegetables.   - Patient is advised to stick to a routine mealtimes to eat 3 meals a day and avoid unnecessary snacks (to snack only to correct hypoglycemia).  - I have approached him with the following individualized plan to manage his diabetes and patient agrees:   - he is advised to continue Farxiga 10 mg po daily, Trulicity 4.5 mg SQ weekly, lower his Glipizide to 5 mg po twice daily with meals, adjust his Metformin to 500 mg ER daily with breakfast, therapeutically suitable for patient .  -he is encouraged to start monitoring glucose 2 times daily (using his CGM), before breakfast and before bed, to log their readings on the clinic sheets provided, and bring them to review at follow up appointment in 4 weeks.  I sent in POGO meter for him but unfortunately, his insurance would not cover it.  He was approved for Dexcom G7.  - Adjustment parameters are given to him for hypo and hyperglycemia in writing. - he is encouraged to call clinic for blood glucose levels less than 70 or above 300 mg /dl.  - Specific targets for  A1c; LDL, HDL, and Triglycerides were discussed with  the patient.  2) Blood Pressure /Hypertension:  his blood pressure is controlled to target.   he is advised to continue his current medications including Norvasc 10 mg p.o. daily with breakfast and Lasix 20 mg po daily as needed for fluid.  3) Lipids/Hyperlipidemia:    Review of his recent lipid panel from 12/01/22 showed controlled LDL at 84 .  he is advised to continue Crestor 10 mg daily at bedtime.  Side effects and precautions discussed with him.  4)  Weight/Diet:  his Body mass index is 26.79 kg/m.  -   he is NOT a candidate for weight loss.  Exercise, and detailed carbohydrates information provided  -  detailed on discharge instructions.  5) Chronic Care/Health  Maintenance: -he is not on ACEI/ARB and is on Statin medications and is encouraged to initiate and continue to follow up with Ophthalmology, Dentist, Podiatrist at least yearly or according to recommendations, and advised to stay away from smoking. I have recommended yearly flu vaccine and pneumonia vaccine at least every 5 years; moderate intensity exercise for up to 150 minutes weekly; and sleep for at least 7 hours a day.  - he is advised to maintain close follow up with Donetta Potts, MD for primary care needs, as well as his other providers for optimal and coordinated care.     I spent  38  minutes in the care of the patient today including review of labs from CMP, Lipids, Thyroid Function, Hematology (current and previous including abstractions from other facilities); face-to-face time discussing  his blood glucose readings/logs, discussing hypoglycemia and hyperglycemia episodes and symptoms, medications doses, his options of short and long term treatment based on the latest standards of care / guidelines;  discussion about incorporating lifestyle medicine;  and documenting the encounter. Risk reduction counseling performed per USPSTF guidelines to reduce obesity and cardiovascular risk factors.     Please refer to Patient Instructions for Blood Glucose Monitoring and Insulin/Medications Dosing Guide"  in media tab for additional information. Please  also refer to " Patient Self Inventory" in the Media  tab for reviewed elements of pertinent patient history.  [name] participated in the discussions, expressed understanding, and voiced agreement with the above plans.  All questions were answered to his satisfaction. he is encouraged to contact clinic should he have any questions or concerns prior to his return visit.     Follow up plan: - Return in about 3 months (around 05/20/2023) for Diabetes F/U with A1c in office, No previsit labs, Bring meter and logs.   Ronny Bacon,  [facility] [address] [place], [place] [address] Phone: [phone] Fax: [phone]  02/17/2023, 8:54 AM

## 2023-02-27 ENCOUNTER — Ambulatory Visit (INDEPENDENT_AMBULATORY_CARE_PROVIDER_SITE_OTHER): Payer: Medicare Other | Admitting: Podiatry

## 2023-02-27 DIAGNOSIS — L97514 Non-pressure chronic ulcer of other part of right foot with necrosis of bone: Secondary | ICD-10-CM | POA: Diagnosis not present

## 2023-02-27 DIAGNOSIS — Z9889 Other specified postprocedural states: Secondary | ICD-10-CM | POA: Diagnosis not present

## 2023-02-27 DIAGNOSIS — E08621 Diabetes mellitus due to underlying condition with foot ulcer: Secondary | ICD-10-CM

## 2023-02-27 NOTE — Progress Notes (Unsigned)
Patient seen for postop from right second toe amputation.  Stitches are to be removed today..  Patient denies f/c/ns/n/v.  Dressing intact.  Patient has no pain due to neuropathy.    On exam, surgical area is closed and coapted well.  Sutures intact with small amount of eschar present.  No erythema.  No edema.  No pain on palpation of area  Sutures removed aseptically today.  Light gauze dressing applied to forefoot.   He can remove dressing in 2 days and resume all activities.  No soaks or baths with foot prolonged submerged in water for another couple of weeks.  Showers are okay in two ore days..  Follow-up with Dr. Ralene Cork in 2 weeks

## 2023-03-02 DIAGNOSIS — I509 Heart failure, unspecified: Secondary | ICD-10-CM | POA: Diagnosis not present

## 2023-03-02 DIAGNOSIS — R7989 Other specified abnormal findings of blood chemistry: Secondary | ICD-10-CM | POA: Diagnosis not present

## 2023-03-02 DIAGNOSIS — Z20822 Contact with and (suspected) exposure to covid-19: Secondary | ICD-10-CM | POA: Diagnosis not present

## 2023-03-02 DIAGNOSIS — Z7901 Long term (current) use of anticoagulants: Secondary | ICD-10-CM | POA: Diagnosis not present

## 2023-03-02 DIAGNOSIS — I4892 Unspecified atrial flutter: Secondary | ICD-10-CM | POA: Diagnosis not present

## 2023-03-02 DIAGNOSIS — I214 Non-ST elevation (NSTEMI) myocardial infarction: Secondary | ICD-10-CM | POA: Diagnosis not present

## 2023-03-02 DIAGNOSIS — Z7982 Long term (current) use of aspirin: Secondary | ICD-10-CM | POA: Diagnosis not present

## 2023-03-02 DIAGNOSIS — R0602 Shortness of breath: Secondary | ICD-10-CM | POA: Diagnosis not present

## 2023-03-02 DIAGNOSIS — Z79899 Other long term (current) drug therapy: Secondary | ICD-10-CM | POA: Diagnosis not present

## 2023-03-02 DIAGNOSIS — E119 Type 2 diabetes mellitus without complications: Secondary | ICD-10-CM | POA: Diagnosis not present

## 2023-03-02 DIAGNOSIS — G4730 Sleep apnea, unspecified: Secondary | ICD-10-CM | POA: Diagnosis not present

## 2023-03-02 DIAGNOSIS — I4891 Unspecified atrial fibrillation: Secondary | ICD-10-CM | POA: Diagnosis not present

## 2023-03-02 DIAGNOSIS — I252 Old myocardial infarction: Secondary | ICD-10-CM | POA: Diagnosis not present

## 2023-03-02 DIAGNOSIS — I2510 Atherosclerotic heart disease of native coronary artery without angina pectoris: Secondary | ICD-10-CM | POA: Diagnosis not present

## 2023-03-02 DIAGNOSIS — Z7984 Long term (current) use of oral hypoglycemic drugs: Secondary | ICD-10-CM | POA: Diagnosis not present

## 2023-03-02 DIAGNOSIS — I110 Hypertensive heart disease with heart failure: Secondary | ICD-10-CM | POA: Diagnosis not present

## 2023-03-02 DIAGNOSIS — I5023 Acute on chronic systolic (congestive) heart failure: Secondary | ICD-10-CM | POA: Diagnosis not present

## 2023-03-02 DIAGNOSIS — E785 Hyperlipidemia, unspecified: Secondary | ICD-10-CM | POA: Diagnosis not present

## 2023-03-02 DIAGNOSIS — I480 Paroxysmal atrial fibrillation: Secondary | ICD-10-CM | POA: Diagnosis not present

## 2023-03-03 ENCOUNTER — Encounter (HOSPITAL_COMMUNITY): Payer: Self-pay

## 2023-03-03 ENCOUNTER — Encounter (HOSPITAL_COMMUNITY): Payer: Self-pay | Admitting: Cardiology

## 2023-03-03 ENCOUNTER — Other Ambulatory Visit: Payer: Self-pay

## 2023-03-03 ENCOUNTER — Inpatient Hospital Stay (HOSPITAL_COMMUNITY)
Admission: EM | Admit: 2023-03-03 | Discharge: 2023-03-07 | DRG: 281 | Disposition: A | Discharge status: Home / Self Care | Payer: Medicare Other | Source: Other Acute Inpatient Hospital | Attending: Cardiology | Admitting: Cardiology

## 2023-03-03 DIAGNOSIS — E785 Hyperlipidemia, unspecified: Secondary | ICD-10-CM | POA: Diagnosis present

## 2023-03-03 DIAGNOSIS — Z833 Family history of diabetes mellitus: Secondary | ICD-10-CM

## 2023-03-03 DIAGNOSIS — I25110 Atherosclerotic heart disease of native coronary artery with unstable angina pectoris: Secondary | ICD-10-CM | POA: Diagnosis present

## 2023-03-03 DIAGNOSIS — T82855A Stenosis of coronary artery stent, initial encounter: Secondary | ICD-10-CM | POA: Diagnosis present

## 2023-03-03 DIAGNOSIS — Z8249 Family history of ischemic heart disease and other diseases of the circulatory system: Secondary | ICD-10-CM

## 2023-03-03 DIAGNOSIS — Z720 Tobacco use: Secondary | ICD-10-CM | POA: Diagnosis not present

## 2023-03-03 DIAGNOSIS — Z7982 Long term (current) use of aspirin: Secondary | ICD-10-CM | POA: Diagnosis not present

## 2023-03-03 DIAGNOSIS — I214 Non-ST elevation (NSTEMI) myocardial infarction: Secondary | ICD-10-CM | POA: Diagnosis present

## 2023-03-03 DIAGNOSIS — I491 Atrial premature depolarization: Secondary | ICD-10-CM | POA: Diagnosis present

## 2023-03-03 DIAGNOSIS — Z7901 Long term (current) use of anticoagulants: Secondary | ICD-10-CM | POA: Diagnosis not present

## 2023-03-03 DIAGNOSIS — I4719 Other supraventricular tachycardia: Secondary | ICD-10-CM | POA: Diagnosis present

## 2023-03-03 DIAGNOSIS — N261 Atrophy of kidney (terminal): Secondary | ICD-10-CM | POA: Diagnosis present

## 2023-03-03 DIAGNOSIS — Z803 Family history of malignant neoplasm of breast: Secondary | ICD-10-CM

## 2023-03-03 DIAGNOSIS — I480 Paroxysmal atrial fibrillation: Secondary | ICD-10-CM | POA: Diagnosis not present

## 2023-03-03 DIAGNOSIS — I080 Rheumatic disorders of both mitral and aortic valves: Secondary | ICD-10-CM | POA: Diagnosis present

## 2023-03-03 DIAGNOSIS — I5023 Acute on chronic systolic (congestive) heart failure: Secondary | ICD-10-CM | POA: Diagnosis not present

## 2023-03-03 DIAGNOSIS — I255 Ischemic cardiomyopathy: Secondary | ICD-10-CM | POA: Diagnosis present

## 2023-03-03 DIAGNOSIS — T82855D Stenosis of coronary artery stent, subsequent encounter: Secondary | ICD-10-CM

## 2023-03-03 DIAGNOSIS — I493 Ventricular premature depolarization: Secondary | ICD-10-CM | POA: Diagnosis present

## 2023-03-03 DIAGNOSIS — I4811 Longstanding persistent atrial fibrillation: Secondary | ICD-10-CM | POA: Diagnosis present

## 2023-03-03 DIAGNOSIS — I5022 Chronic systolic (congestive) heart failure: Secondary | ICD-10-CM | POA: Diagnosis not present

## 2023-03-03 DIAGNOSIS — N1831 Chronic kidney disease, stage 3a: Secondary | ICD-10-CM | POA: Diagnosis present

## 2023-03-03 DIAGNOSIS — Z7984 Long term (current) use of oral hypoglycemic drugs: Secondary | ICD-10-CM

## 2023-03-03 DIAGNOSIS — I130 Hypertensive heart and chronic kidney disease with heart failure and stage 1 through stage 4 chronic kidney disease, or unspecified chronic kidney disease: Secondary | ICD-10-CM | POA: Diagnosis present

## 2023-03-03 DIAGNOSIS — I701 Atherosclerosis of renal artery: Secondary | ICD-10-CM | POA: Diagnosis present

## 2023-03-03 DIAGNOSIS — I2582 Chronic total occlusion of coronary artery: Secondary | ICD-10-CM | POA: Diagnosis present

## 2023-03-03 DIAGNOSIS — G4733 Obstructive sleep apnea (adult) (pediatric): Secondary | ICD-10-CM | POA: Diagnosis present

## 2023-03-03 DIAGNOSIS — E1151 Type 2 diabetes mellitus with diabetic peripheral angiopathy without gangrene: Secondary | ICD-10-CM | POA: Diagnosis present

## 2023-03-03 DIAGNOSIS — Z7985 Long-term (current) use of injectable non-insulin antidiabetic drugs: Secondary | ICD-10-CM

## 2023-03-03 DIAGNOSIS — I440 Atrioventricular block, first degree: Secondary | ICD-10-CM | POA: Diagnosis present

## 2023-03-03 DIAGNOSIS — E871 Hypo-osmolality and hyponatremia: Secondary | ICD-10-CM | POA: Diagnosis present

## 2023-03-03 DIAGNOSIS — I4892 Unspecified atrial flutter: Secondary | ICD-10-CM | POA: Diagnosis present

## 2023-03-03 DIAGNOSIS — Z808 Family history of malignant neoplasm of other organs or systems: Secondary | ICD-10-CM

## 2023-03-03 DIAGNOSIS — I2489 Other forms of acute ischemic heart disease: Secondary | ICD-10-CM | POA: Diagnosis present

## 2023-03-03 DIAGNOSIS — Z79899 Other long term (current) drug therapy: Secondary | ICD-10-CM

## 2023-03-03 DIAGNOSIS — Z8051 Family history of malignant neoplasm of kidney: Secondary | ICD-10-CM

## 2023-03-03 DIAGNOSIS — I21A1 Myocardial infarction type 2: Secondary | ICD-10-CM | POA: Diagnosis present

## 2023-03-03 DIAGNOSIS — N182 Chronic kidney disease, stage 2 (mild): Secondary | ICD-10-CM | POA: Diagnosis present

## 2023-03-03 DIAGNOSIS — I5042 Chronic combined systolic (congestive) and diastolic (congestive) heart failure: Secondary | ICD-10-CM | POA: Diagnosis present

## 2023-03-03 DIAGNOSIS — E119 Type 2 diabetes mellitus without complications: Secondary | ICD-10-CM | POA: Diagnosis not present

## 2023-03-03 DIAGNOSIS — Y831 Surgical operation with implant of artificial internal device as the cause of abnormal reaction of the patient, or of later complication, without mention of misadventure at the time of the procedure: Secondary | ICD-10-CM | POA: Diagnosis present

## 2023-03-03 DIAGNOSIS — E1165 Type 2 diabetes mellitus with hyperglycemia: Secondary | ICD-10-CM

## 2023-03-03 DIAGNOSIS — I25810 Atherosclerosis of coronary artery bypass graft(s) without angina pectoris: Secondary | ICD-10-CM | POA: Diagnosis not present

## 2023-03-03 DIAGNOSIS — I340 Nonrheumatic mitral (valve) insufficiency: Secondary | ICD-10-CM | POA: Diagnosis present

## 2023-03-03 DIAGNOSIS — E1122 Type 2 diabetes mellitus with diabetic chronic kidney disease: Secondary | ICD-10-CM | POA: Diagnosis present

## 2023-03-03 DIAGNOSIS — Z951 Presence of aortocoronary bypass graft: Secondary | ICD-10-CM

## 2023-03-03 DIAGNOSIS — I441 Atrioventricular block, second degree: Secondary | ICD-10-CM | POA: Diagnosis present

## 2023-03-03 DIAGNOSIS — I471 Supraventricular tachycardia, unspecified: Secondary | ICD-10-CM | POA: Diagnosis present

## 2023-03-03 DIAGNOSIS — I25710 Atherosclerosis of autologous vein coronary artery bypass graft(s) with unstable angina pectoris: Secondary | ICD-10-CM | POA: Diagnosis present

## 2023-03-03 DIAGNOSIS — I2510 Atherosclerotic heart disease of native coronary artery without angina pectoris: Secondary | ICD-10-CM | POA: Diagnosis not present

## 2023-03-03 DIAGNOSIS — I110 Hypertensive heart disease with heart failure: Secondary | ICD-10-CM | POA: Diagnosis not present

## 2023-03-03 DIAGNOSIS — I5030 Unspecified diastolic (congestive) heart failure: Secondary | ICD-10-CM | POA: Diagnosis not present

## 2023-03-03 LAB — GLUCOSE, CAPILLARY: Glucose-Capillary: 262 mg/dL — ABNORMAL HIGH (ref 70–99)

## 2023-03-03 MED ORDER — ORAL CARE MOUTH RINSE: 15.0000 mL | OROMUCOSAL | Status: DC | PRN

## 2023-03-03 MED ORDER — ASPIRIN 81 MG PO TBEC
81.0000 mg | DELAYED_RELEASE_TABLET | Freq: Every day | ORAL | Status: DC
  Administered 2023-03-04 – 2023-03-07 (×4): 81 mg via ORAL
  Filled 2023-03-03 (×5): qty 1

## 2023-03-03 MED ORDER — NITROGLYCERIN 0.4 MG SL SUBL: 0.4000 mg | SUBLINGUAL_TABLET | SUBLINGUAL | Status: DC | PRN

## 2023-03-03 MED ORDER — ACETAMINOPHEN 325 MG PO TABS: 650.0000 mg | ORAL_TABLET | ORAL | Status: DC | PRN

## 2023-03-03 MED ORDER — SODIUM CHLORIDE 0.9% FLUSH
3.0000 mL | Freq: Two times a day (BID) | INTRAVENOUS | Status: DC
  Administered 2023-03-04 – 2023-03-07 (×8): 3 mL via INTRAVENOUS

## 2023-03-03 MED ORDER — AMLODIPINE BESYLATE 10 MG PO TABS
10.0000 mg | ORAL_TABLET | Freq: Every day | ORAL | Status: DC
  Administered 2023-03-03 – 2023-03-07 (×5): 10 mg via ORAL
  Filled 2023-03-03 (×5): qty 1

## 2023-03-03 MED ORDER — ROSUVASTATIN CALCIUM 5 MG PO TABS
10.0000 mg | ORAL_TABLET | ORAL | Status: DC
  Administered 2023-03-06: 10 mg via ORAL
  Filled 2023-03-03 (×3): qty 2

## 2023-03-03 MED ORDER — HEPARIN (PORCINE) 25000 UT/250ML-% IV SOLN
1500.0000 [IU]/h | INTRAVENOUS | Status: DC
  Administered 2023-03-03: 1100 [IU]/h via INTRAVENOUS
  Administered 2023-03-04: 1550 [IU]/h via INTRAVENOUS
  Administered 2023-03-04: 1100 [IU]/h via INTRAVENOUS
  Administered 2023-03-05: 1500 [IU]/h via INTRAVENOUS
  Filled 2023-03-03 (×4): qty 250

## 2023-03-03 MED ORDER — ONDANSETRON HCL 4 MG/2ML IJ SOLN: 4.0000 mg | Freq: Four times a day (QID) | INTRAMUSCULAR | Status: DC | PRN

## 2023-03-03 MED ORDER — INSULIN ASPART 100 UNIT/ML IJ SOLN
0.0000 [IU] | Freq: Three times a day (TID) | INTRAMUSCULAR | Status: DC
  Administered 2023-03-04: 11 [IU] via SUBCUTANEOUS
  Administered 2023-03-04: 5 [IU] via SUBCUTANEOUS
  Administered 2023-03-04: 3 [IU] via SUBCUTANEOUS
  Administered 2023-03-05: 5 [IU] via SUBCUTANEOUS
  Administered 2023-03-05: 3 [IU] via SUBCUTANEOUS
  Administered 2023-03-05: 8 [IU] via SUBCUTANEOUS
  Administered 2023-03-06: 5 [IU] via SUBCUTANEOUS
  Administered 2023-03-06: 3 [IU] via SUBCUTANEOUS
  Administered 2023-03-07: 2 [IU] via SUBCUTANEOUS

## 2023-03-03 MED ORDER — DAPAGLIFLOZIN PROPANEDIOL 10 MG PO TABS
10.0000 mg | ORAL_TABLET | Freq: Every day | ORAL | Status: DC
  Administered 2023-03-03 – 2023-03-07 (×5): 10 mg via ORAL
  Filled 2023-03-03 (×5): qty 1

## 2023-03-03 NOTE — H&P (Addendum)
Cardiology Admission History and Physical   Patient ID: KASEM MOZER MRN: [id number]; DOB: April 03, 1951   Admission date: 03/03/2023  PCP:  Donetta Potts, MD   [facility] Providers Cardiologist:  Rollene Rotunda, MD     Chief Complaint:  Dyspnea  Patient Profile:   [name] is a 72 y.o. male with paoxysmal atrial fibrillation, CAD s/p PCI in 2001, s/p 5vCABG (LIMA-LAD, sequential SVG to RI and OM1, sequential SVG to PDA and PL in 2003), chronic diastolic heart fialure, mild MR, mild AS, HTN, HLD, PAD, CKD, DM, and OSA who is being seen 03/03/2023 for the evaluation of NSTEMI/afib.  History of Present Illness:   [name] is a 72 year old male with past medical history noted above.  He has been followed by Dr. Antoine Poche as an outpatient, as well as Dr. Kirke Corin for peripheral vascular disease.  Previously admitted 2022 for syncope and underwent cardiac catheterization and found to have 3/5 patent grafts and wore a cardiac monitor showing atrial fibrillation with rates as low as 30s predominantly during sleeping.  No A-fib felt to be chronic he felt better back in sinus rhythm.  He was admitted 11/2022 after being set up for an outpatient PV angiogram but arrived fluid overloaded requiring IV Lasix.  During that admission echocardiogram showed LVEF of 50 to 55%, positive wall motion abnormality similar to prior territory, moderate biatrial enlargement, mild MR, mild AS.  In that admission he was found to have intermittent atrial fibrillation but fairly regularly controlled.  He was resumed on Eliquis.  EP angiogram that admission showed subtotally occluded left renal artery with atrophy of left kidney.  Renal artery duplex showed 1-59% left renal artery stenosis with normal size of left kidney, patent left renal artery with mild elevated velocities without significant stenosis, right side had abnormal right resistive index but no significant RAS.   Presented to [facility] for SOB. When he arrived, he was found to be in Afib to the 110s, given metoprolol and then he cardioverted to NSR. No CP. BNP elevated to 589, treated with IV Lasix 20 mg x 1.  Chest x-ray with increased vascular congestion, mild interstitial edema.  EKG 6/20 2336--> reads as possible ectopic atrial tachycardia though suspect atrial flutter, 2-1, 120 bpm  Hs-trop 69>> 426>> 3376.  He was given aspirin and started on IV heparin.  Transferred to [facility] for further evaluation.    On arrival patient remains on 2 L nasal cannula, denies any chest pain at present.  Feeling much better than initial presentation.  Reports his last dose of Eliquis was evening of 6/20.   Past Medical History:  Diagnosis Date   A-fib [facility])    CAD (coronary artery disease)    Hypertension    Obstructive sleep apnea    CPAP   Rosacea    Type 2 diabetes mellitus with hyperglycemia [facility])     Past Surgical History:  Procedure Laterality Date   ABDOMINAL AORTOGRAM W/LOWER EXTREMITY Right 11/30/2022   Procedure: ABDOMINAL AORTOGRAM W/LOWER EXTREMITY;  Surgeon: Iran Ouch, MD;  Location: [facility] INVASIVE CV LAB;  Service: Cardiovascular;  Laterality: Right;   CORONARY ARTERY BYPASS GRAFT     LIMA to the LAD, sequential SVG to ramus intermediate and OM1, sequential SVG to PDA and posterior lateral   RIGHT/LEFT HEART CATH AND CORONARY/GRAFT ANGIOGRAPHY N/A 01/21/2021   Procedure: RIGHT/LEFT HEART CATH AND CORONARY/GRAFT ANGIOGRAPHY;  Surgeon: Runell Gess, MD;  Location: [facility] INVASIVE CV  LAB;  Service: Cardiovascular;  Laterality: N/A;     Medications Prior to Admission: Prior to Admission medications   Medication Sig Start Date End Date Taking? Authorizing Provider  amLODipine (NORVASC) 10 MG tablet Take 10 mg by mouth daily.    [provider]  aspirin EC 81 MG tablet Take 1 tablet (81 mg total) by mouth daily. Swallow whole. 11/22/22   Iran Ouch, MD  dapagliflozin propanediol  (FARXIGA) 10 MG TABS tablet Take 1 tablet (10 mg total) by mouth daily. 12/03/22   Jodelle Red, MD  Dulaglutide (TRULICITY) 4.5 MG/0.5ML SOPN Inject 4.5 mg into the skin once a week.    [provider]  ELIQUIS 5 MG TABS tablet TAKE 1 TABLET BY MOUTH TWICE A DAY 02/13/23   Lennette Bihari, MD  ergocalciferol (VITAMIN D2) 1.25 MG (50000 UT) capsule Take 50,000 Units by mouth every Saturday.    [provider]  furosemide (LASIX) 20 MG tablet TAKE 1 TABLET BY MOUTH DAILY AS NEEDED (WEIGHT GAIN OF 3-5LB OR INCREASED SHORTNESS OF BREATH). 12/26/22   Rollene Rotunda, MD  glipiZIDE (GLUCOTROL) 10 MG tablet Take 5 mg by mouth 2 (two) times daily before a meal.    [provider]  hydrochlorothiazide (HYDRODIURIL) 12.5 MG tablet Take 12.5 mg by mouth daily. 01/20/23   [provider]  metFORMIN (GLUCOPHAGE-XR) 500 MG 24 hr tablet Take 500 mg by mouth daily with breakfast.    [provider]  ondansetron (ZOFRAN) 4 MG tablet Take 1 tablet (4 mg total) by mouth every 8 (eight) hours as needed for nausea or vomiting. 02/07/23   Louann Sjogren, DPM  rosuvastatin (CRESTOR) 10 MG tablet Take 10 mg by mouth See admin instructions. Takes 10 mg on Mondays and Fridays 01/02/21   [provider]  triamcinolone cream (KENALOG) 0.1 % Apply 1 Application topically 2 (two) times daily as needed (dry skin).    [provider]     Allergies:   No Known Allergies  Social History:   Social History   Socioeconomic History   Marital status: Married    Spouse name: Production designer, theatre/television/film   Number of children: 3   Years of education: Not on file   Highest education level: Not on file  Occupational History   Not on file  Tobacco Use   Smoking status: Never   Smokeless tobacco: Current    Types: Snuff   Tobacco comments:    Uses Snus   Vaping Use   Vaping Use: Never used  Substance and Sexual Activity   Alcohol use: Yes    Comment: 1-2 beers a day   Drug  use: Never   Sexual activity: Not on file  Other Topics Concern   Not on file  Social History Narrative   Lives with wife and two grandchildren.     Lives in a one story home    Right Handed    Drinks little caffeine    Social Determinants of Health   Financial Resource Strain: Not on file  Food Insecurity: Not on file  Transportation Needs: Not on file  Physical Activity: Not on file  Stress: Not on file  Social Connections: Not on file  Intimate Partner Violence: Not on file    Family History:   The patient's family history includes Brain cancer in his mother; Breast cancer in his mother; CAD (age of onset: 32) in his brother; Cancer - Other in his father; Diabetes in his brother and mother;  Fibromyalgia in his sister; Heart Problems in his sister; Heart attack in his mother; Heart attack (age of onset: 43) in his father; Heart disease in his father; Heart disease (age of onset: 18) in his mother; Kidney cancer in his father; Migraines in his sister.    ROS:  Please see the history of present illness.  All other ROS reviewed and negative.     Physical Exam/Data:  There were no vitals filed for this visit. No intake or output data in the 24 hours ending 03/03/23 1653    02/17/2023    8:00 AM 01/12/2023    9:45 AM 01/03/2023    3:29 PM  Last 3 Weights  Weight (lbs) 181 lb 6.4 oz 186 lb 3.2 oz 186 lb 9.6 oz  Weight (kg) 82.283 kg 84.46 kg 84.641 kg     There is no height or weight on file to calculate BMI.  General:  Well nourished, well developed, in no acute distress, on [place] 2@L  HEENT: normal Neck: no JVD Vascular: No carotid bruits; Distal pulses 2+ bilaterally   Cardiac:  normal S1, S2; RRR; no murmur  Lungs:  clear to auscultation bilaterally, no wheezing, rhonchi or rales  Abd: soft, nontender, no hepatomegaly  Ext: no edema Musculoskeletal:  No deformities, BUE and BLE strength normal and equal Skin: warm and dry  Neuro:  CNs 2-12 intact, no focal abnormalities  noted Psych:  Normal affect    EKG:  The ECG that was done 6/20 was personally reviewed and demonstrates appears 2:1 atrial flutter 120 bpm  Relevant CV Studies:  TTE 11/30/22:  1. Left ventricular ejection fraction, by estimation, is 50 to 55%. The  left ventricle has low normal function. The left ventricle demonstrates  regional wall motion abnormalities (see scoring diagram/findings for  description). Left ventricular diastolic   parameters are indeterminate. There is hypokinesis of the left  ventricular, apical anterior wall and apical segment.   2. Right ventricular systolic function is normal. The right ventricular  size is normal.   3. Left atrial size was moderately dilated.   4. Right atrial size was moderately dilated.   5. The mitral valve is normal in structure. Mild mitral valve  regurgitation. No evidence of mitral stenosis.   6. The aortic valve is tricuspid. There is mild calcification of the  aortic valve. Aortic valve regurgitation is not visualized. Mild aortic  valve stenosis. Aortic valve area, by VTI measures 1.80 cm. Aortic valve  mean gradient measures 7.0 mmHg.  Aortic valve Vmax measures 1.73 m/s.   7. The inferior vena cava is normal in size with greater than 50%  respiratory variability, suggesting right atrial pressure of 3 mmHg.   Cardiac monitor 02/2021: Patch Wear Time: 13 days and 23 hours (2022-05-13T12:17:15-0400 to 2022-05-27T11:22:43-0400) Atrial Fibrillation occurred continuously (100% burden), ranging from 28-123 bpm (avg of 60 bpm). 3 Pauses occurred, the longest lasting 4.7 secs (13 bpm). Isolated VEs were rare (<1.0%, 8592), VE Couplets were rare (<1.0%, 115), and VE Triplets were rare (<1.0%, 1). MD notification criteria for Slow Atrial Fibrillation met - Notified Melinda P LPN on 6/44/0347 at 9:20 AM EDT (KT)   Atrial fib and flutter slow ventricular rate  Frequent ventricular pauses with the longest pause being 4.7 seconds Rare ventricular  ectopy with one triplet  RHC/coronary angiography 01/2021: 1: Right atrial pressure-12/16, mean 13 2: Right ventricular pressure- 66/3 3: Pulmonary artery pressure-65/20, mean 34 4: Pulmonary wedge pressure- A-wave 18, V wave 25, mean 20 5:  LVEDP-16 6: Cardiac output- 5.4 L/min with an index of 2.7 L/min/m by Fick, 5.6 L/min with an index of 2.8 L/min/m by thermodilution.   IMPRESSION: [name] has 3 out of 5 grafts patent.  The sequential vein to the ramus branch and obtuse marginal branch was occluded at its origin.  Sequential vein to the PDA and PLA was widely patent as was the LIMA to the LAD.  The filling pressures were mildly elevated as well as the PA pressure suggesting mild to moderate pulmonary hypertension.  Patient would benefit from additional diuresis.  Mynx closure devices were successfully deployed in the artery and vein.  The patient left lab in stable condition.  Coumadin will be restarted per pharmacy protocol for his persistent A. fib.   Laboratory Data:  High Sensitivity Troponin:  No results for input(s): "TROPONINIHS" in the last 720 hours.    ChemistryNo results for input(s): "NA", "K", "CL", "CO2", "GLUCOSE", "BUN", "CREATININE", "CALCIUM", "MG", "GFRNONAA", "GFRAA", "ANIONGAP" in the last 168 hours.  No results for input(s): "PROT", "ALBUMIN", "AST", "ALT", "ALKPHOS", "BILITOT" in the last 168 hours. Lipids No results for input(s): "CHOL", "TRIG", "HDL", "LABVLDL", "LDLCALC", "CHOLHDL" in the last 168 hours. HematologyNo results for input(s): "WBC", "RBC", "HGB", "HCT", "MCV", "MCH", "MCHC", "RDW", "PLT" in the last 168 hours. Thyroid No results for input(s): "TSH", "FREET4" in the last 168 hours. BNPNo results for input(s): "BNP", "PROBNP" in the last 168 hours.  DDimer No results for input(s): "DDIMER" in the last 168 hours.   Radiology/Studies:  No results found.   Assessment and Plan:   NSTEMI CAD s/p PCI in 2001, s/p 5vCABG (LIMA-LAD, sequential SVG  to RI and OM1, sequential SVG to PDA and PL in 2003) -- Presented to [facility] for SOB, found to be slightly tachycardic in atrial flutter to the 110s. hsTn 69>>426>>3376 -- denies any chest pain, but with elevated troponin into the 3000 range, concern for progressive graft disease -- discussed plans for cardiac cath, planned for Monday -- continue IV heparin, statin (not on BB therapy with hx of bradycardia/Mobitz type I) -- continue ASA, statin  Informed Consent   Shared Decision Making/Informed Consent The risks [stroke (1 in 1000), death (1 in 1000), kidney failure [usually temporary] (1 in 500), bleeding (1 in 200), allergic reaction [possibly serious] (1 in 200)], benefits (diagnostic support and management of coronary artery disease) and alternatives of a cardiac catheterization were discussed in detail with [name] and he is willing to proceed.  Atrial flutter RVR -- found to be in atrial flutter with RVR, 120bpm. Given IV metoprolol with conversion to sinus rhythm with PVCs -- has been complaint with his Eliquis PTA, last dose was 6/20 PM -- continue IV heparin  HFpEF -- CXR with edema, received IV lasix 20mg  x1. Improvement in symptoms. Only been on lasix PRN -- appears euvolemic at this time -- has been on farxiga PTA  HLD -- continue statin  CKD IIIa -- Cr 1.45 at [facility] -- follow BMET  HTN -- continue amlodipine, hold hydrochlorothiazide  DM -- last Hgb A1c 8.4 (11/2022) -- hold metformin -- SSI, continue farxiga   Risk Assessment/Risk Scores:   TIMI Risk Score for Unstable Angina or Non-ST Elevation MI:   The patient's TIMI risk score is 5, which indicates a 26% risk of all cause mortality, new or recurrent myocardial infarction or need for urgent revascularization in the next 14 days.   New York Heart Association (NYHA) Functional Class NYHA Class II  CHA2DS2-VASc Score = 5   This indicates a 7.2% annual risk of stroke. The patient's score is based  upon: CHF History: 1 HTN History: 1 Diabetes History: 1 Stroke History: 0 Vascular Disease History: 1 Age Score: 1 Gender Score: 0     Severity of Illness: The appropriate patient status for this patient is INPATIENT. Inpatient status is judged to be reasonable and necessary in order to provide the required intensity of service to ensure the patient's safety. The patient's presenting symptoms, physical exam findings, and initial radiographic and laboratory data in the context of their chronic comorbidities is felt to place them at high risk for further clinical deterioration. Furthermore, it is not anticipated that the patient will be medically stable for discharge from the hospital within 2 midnights of admission.   * I certify that at the point of admission it is my clinical judgment that the patient will require inpatient hospital care spanning beyond 2 midnights from the point of admission due to high intensity of service, high risk for further deterioration and high frequency of surveillance required.*   For questions or updates, please contact [facility] Please consult [email or web address] for contact info under     Signed, Laverda Page, NP  03/03/2023 4:53 PM     As above, patient seen and examined.  Briefly he is a 72 year old male with past medical history of paroxysmal atrial fibrillation, coronary artery disease status post coronary bypass and graft, chronic diastolic congestive heart failure, mild mitral regurgitation/mild aortic stenosis, hypertension, hyperlipidemia, chronic stage IIIa kidney disease, diabetes mellitus, peripheral vascular disease, obstructive sleep apnea for evaluation of atrial flutter and non-ST elevation myocardial infarction.  Last echocardiogram March 2024 showed ejection fraction 50 to 55%, moderate biatrial enlargement, mild mitral regurgitation, mild aortic stenosis.  Patient typically does not have exertional chest pain or palpitations.  He does  have dyspnea on exertion which is chronic but denies orthopnea, PND or pedal edema.  Last evening he developed a dry cough with nausea followed by shortness of breath.  No chest pain.  His shortness of breath worsened and he presented to the emergency room at [facility].  He was found to be in atrial flutter.  He was given IV metoprolol and converted.  His troponins were elevated and he was transferred for further management.  He presently denies dyspnea or chest pain.  Electrocardiogram shows atrial flutter at a rate of 120, left axis deviation, cannot rule out septal infarct, nonspecific ST changes.  Follow-up electrocardiogram showed sinus rhythm with PVCs, first-degree AV block, nonconducted PACs, septal infarct and left axis deviation.  On telemetry he is noted to have occasional Mobitz 1 second-degree AV block.  His troponins are 69 increasing to 3376. Creatinine 1.45.  Chest x-ray showed CHF.  1 non-ST elevation myocardial infarction-patient presented with episode of dyspnea in the setting of atrial flutter.  He converted to sinus rhythm with a dose of IV metoprolol.  However his troponin has increased significantly.  Question demand ischemia from atrial flutter versus non-ST elevation myocardial infarction.  His grafts are greater than 85 years old.  Will plan cardiac catheterization for definitive evaluation.  The risk and benefits including myocardial infarction, CVA, death and worsening renal function discussed and he agrees to proceed.  Will treat with aspirin, heparin and statin.  Avoid beta-blockade in the setting of intermittent Mobitz 1 second-degree AV block.  Will repeat echocardiogram as well.  2 history of paroxysmal atrial fibrillation/flutter-patient presented in atrial flutter but converted  to sinus rhythm with a dose of IV metoprolol.  Apixaban on hold for upcoming catheterization.  Will resume once all procedures are complete.  3 Mobitz 1 second-degree AV block-intermittent on  telemetry.  Avoid AV nodal blocking agents.  If he has frequent episodes of atrial fibrillation or flutter with elevated rate would need to consider pacemaker to add additional AV nodal blocking agents.  4 chronic stage IIIa kidney disease-baseline creatinine 1.45.  Will hold diuretics prior to catheterization.  Follow renal function after procedure.  No ventriculogram.  5 chronic diastolic congestive heart failure-will hold diuretics as he appears to be euvolemic on examination.  Continue Farxiga.  6 hypertension-continue preadmission blood pressure medications and follow.  7 hyperlipidemia-continue statin.  Olga Millers, MD

## 2023-03-03 NOTE — Progress Notes (Signed)
ANTICOAGULATION CONSULT NOTE - Initial Consult  Pharmacy Consult for Heparin Indication: chest pain/ACS  No Known Allergies  Patient Measurements: Height: 5\' 9"  (175.3 cm) Weight: 84 kg (185 lb 3 oz) IBW/kg (Calculated) : 70.7 Heparin Dosing Weight: 84 kg  Vital Signs:    Labs: No results for input(s): "HGB", "HCT", "PLT", "APTT", "LABPROT", "INR", "HEPARINUNFRC", "HEPRLOWMOCWT", "CREATININE", "CKTOTAL", "CKMB", "TROPONINIHS" in the last 72 hours.  CrCl cannot be calculated (Patient's most recent lab result is older than the maximum 21 days allowed.).   Medical History: Past Medical History:  Diagnosis Date   A-fib [facility])    CAD (coronary artery disease)    Hypertension    Obstructive sleep apnea    CPAP   Rosacea    Type 2 diabetes mellitus with hyperglycemia (HCC)    Assessment: 47 YOM admitted for NSTEMI - cardiology planning LHC on 6/24. Pharmacy consulted to dose heparin. Takes Eliquis prior to admission - last dose 6/20 PM.  Will monitor with aPTT until levels are correlating.  Patient transferred with heparin running at 1100 units/hr  Goal of Therapy:  Heparin level 0.3-0.7 units/ml aPTT 66-102 seconds Monitor platelets by anticoagulation protocol: Yes   Plan:  Continue heparin 1100 units/hr Check 8hr aPTT Daily heparin level/aPTT and CBC Monitor for signs/symptoms of bleeding  Ellis Savage, PharmD Clinical Pharmacist 03/03/2023,5:18 PM

## 2023-03-04 ENCOUNTER — Inpatient Hospital Stay (HOSPITAL_COMMUNITY): Payer: Medicare Other

## 2023-03-04 DIAGNOSIS — I5030 Unspecified diastolic (congestive) heart failure: Secondary | ICD-10-CM | POA: Diagnosis not present

## 2023-03-04 DIAGNOSIS — I214 Non-ST elevation (NSTEMI) myocardial infarction: Secondary | ICD-10-CM | POA: Diagnosis not present

## 2023-03-04 LAB — CBC
HCT: 39.3 % (ref 39.0–52.0)
Hemoglobin: 12.4 g/dL — ABNORMAL LOW (ref 13.0–17.0)
MCH: 26.2 pg (ref 26.0–34.0)
MCHC: 31.6 g/dL (ref 30.0–36.0)
MCV: 83.1 fL (ref 80.0–100.0)
Platelets: 217 10*3/uL (ref 150–400)
RBC: 4.73 MIL/uL (ref 4.22–5.81)
RDW: 14.6 % (ref 11.5–15.5)
WBC: 9.3 10*3/uL (ref 4.0–10.5)
nRBC: 0 % (ref 0.0–0.2)

## 2023-03-04 LAB — BASIC METABOLIC PANEL
Anion gap: 11 (ref 5–15)
BUN: 20 mg/dL (ref 8–23)
CO2: 23 mmol/L (ref 22–32)
Calcium: 9.1 mg/dL (ref 8.9–10.3)
Chloride: 97 mmol/L — ABNORMAL LOW (ref 98–111)
Creatinine, Ser: 1.25 mg/dL — ABNORMAL HIGH (ref 0.61–1.24)
GFR, Estimated: >60 mL/min (ref >60)
Glucose, Bld: 194 mg/dL — ABNORMAL HIGH (ref 70–99)
Potassium: 3.4 mmol/L — ABNORMAL LOW (ref 3.5–5.1)
Sodium: 131 mmol/L — ABNORMAL LOW (ref 135–145)

## 2023-03-04 LAB — GLUCOSE, CAPILLARY
Glucose-Capillary: 158 mg/dL — ABNORMAL HIGH (ref 70–99)
Glucose-Capillary: 324 mg/dL — ABNORMAL HIGH (ref 70–99)
Glucose-Capillary: 300 mg/dL — ABNORMAL HIGH (ref 70–99)

## 2023-03-04 LAB — ECHOCARDIOGRAM COMPLETE
AR max vel: 2.7 cm2
AV Area VTI: 2.58 cm2
AV Area mean vel: 2.59 cm2
AV Mean grad: 4 mmHg
AV Peak grad: 6.5 mmHg
Ao pk vel: 1.27 m/s
Area-P 1/2: 4.46 cm2
Height: 69 in
S' Lateral: 4.7 cm
Weight: 2822.4 oz

## 2023-03-04 LAB — APTT
aPTT: 45 seconds — ABNORMAL HIGH (ref 24–36)
aPTT: 61 seconds — ABNORMAL HIGH (ref 24–36)
aPTT: 89 seconds — ABNORMAL HIGH (ref 24–36)

## 2023-03-04 LAB — HEMOGLOBIN A1C
Hgb A1c MFr Bld: 7.5 % — ABNORMAL HIGH (ref 4.8–5.6)
Mean Plasma Glucose: 168.55 mg/dL

## 2023-03-04 MED ORDER — SODIUM CHLORIDE 0.9 % WEIGHT BASED INFUSION: 1.0000 mL/kg/h | INTRAVENOUS | Status: DC

## 2023-03-04 MED ORDER — SODIUM CHLORIDE 0.9% FLUSH: 3.0000 mL | INTRAVENOUS | Status: DC | PRN

## 2023-03-04 MED ORDER — SODIUM CHLORIDE 0.9 % WEIGHT BASED INFUSION: 3.0000 mL/kg/h | INTRAVENOUS | Status: DC

## 2023-03-04 MED ORDER — SODIUM CHLORIDE 0.9 % IV SOLN: 250.0000 mL | INTRAVENOUS | Status: DC | PRN

## 2023-03-04 NOTE — Progress Notes (Signed)
ANTICOAGULATION CONSULT NOTE - Follow Up Consult  Pharmacy Consult for heparin Indication:  NSTEMI/Afib  Labs: Recent Labs    03/04/23 0107  HGB 12.4*  HCT 39.3  PLT 217  APTT 45*  CREATININE 1.25*    Assessment: 72yo male subtherapeutic on heparin with initial dosing while apixaban on hold; no infusion issues or signs of bleeding per RN.  Goal of Therapy:  aPTT 66-102 seconds   Plan:  Increase heparin infusion by 3-4 units/kg/hr to 1400 units/hr. Check level in 6-8 hours.   Vernard Gambles, PharmD, BCPS 03/04/2023 3:17 AM

## 2023-03-04 NOTE — Plan of Care (Signed)
  Problem: Education: Goal: Knowledge of General Education information will improve Description: Including pain rating scale, medication(s)/side effects and non-pharmacologic comfort measures Outcome: Progressing   Problem: Activity: Goal: Risk for activity intolerance will decrease Outcome: Progressing   Problem: Coping: Goal: Level of anxiety will decrease Outcome: Progressing   

## 2023-03-04 NOTE — Progress Notes (Signed)
ANTICOAGULATION CONSULT NOTE - Initial Consult  Pharmacy Consult for Heparin Indication: chest pain/ACS  No Known Allergies  Patient Measurements: Height: 5\' 9"  (175.3 cm) Weight: 80 kg (176 lb 6.4 oz) IBW/kg (Calculated) : 70.7 Heparin Dosing Weight: 84 kg  Vital Signs: Temp: 98.1 F (36.7 C) (06/22 1949) Temp Source: Oral (06/22 1949) BP: 135/79 (06/22 1949) Pulse Rate: 84 (06/22 1949)  Labs: Recent Labs    03/04/23 0107 03/04/23 1041 03/04/23 1943  HGB 12.4*  --   --   HCT 39.3  --   --   PLT 217  --   --   APTT 45* 61* 89*  CREATININE 1.25*  --   --      Estimated Creatinine Clearance: 54.2 mL/min (A) (by C-G formula based on SCr of 1.25 mg/dL (H)).   Medical History: Past Medical History:  Diagnosis Date   A-fib [facility])    CAD (coronary artery disease)    Hypertension    Obstructive sleep apnea    CPAP   Rosacea    Type 2 diabetes mellitus with hyperglycemia (HCC)    Assessment: 76 YOM admitted for NSTEMI - cardiology planning LHC on 6/24. Pharmacy consulted to dose heparin. Takes Eliquis prior to admission - last dose 6/20 PM.  Will monitor with aPTT until levels are correlating.  PM update: aPTT this evening is therapeutic at 89 seconds but noted large jump from this AM (61 seconds) after rate increase to 1550 units/hr.   Goal of Therapy:  Heparin level 0.3-0.7 units/ml aPTT 66-102 seconds Monitor platelets by anticoagulation protocol: Yes   Plan:  Decrease heparin drip slightly to 1500 units/hr Daily heparin level/aPTT and CBC Monitor for signs/symptoms of bleeding  Rexford Maus, PharmD, BCPS 03/04/2023 8:12 PM

## 2023-03-04 NOTE — Plan of Care (Signed)
  Problem: Education: Goal: Knowledge of General Education information will improve Description: Including pain rating scale, medication(s)/side effects and non-pharmacologic comfort measures Outcome: Progressing   Problem: Clinical Measurements: Goal: Ability to maintain clinical measurements within normal limits will improve Outcome: Progressing Goal: Diagnostic test results will improve Outcome: Progressing   

## 2023-03-04 NOTE — Progress Notes (Addendum)
DAILY PROGRESS NOTE   Patient Name: [name] Date of Encounter: 03/04/2023 Cardiologist: Rollene Rotunda, MD  Chief Complaint   No chest pain  Patient Profile   [name] is a 72 y.o. male with paoxysmal atrial fibrillation, CAD s/p PCI in 2001, s/p 5vCABG (LIMA-LAD, sequential SVG to RI and OM1, sequential SVG to PDA and PL in 2003), chronic diastolic heart fialure, mild MR, mild AS, HTN, HLD, PAD, CKD, DM, and OSA who is being seen 03/03/2023 for the evaluation of NSTEMI/afib.   Subjective   Seems to be doing well. I suspect he had graft occlusion. Plan for [facility] on Monday. On IV heparin. Appears euvolemic.  Objective   Vitals:   03/03/23 2300 03/04/23 0013 03/04/23 0545 03/04/23 0823  BP:  107/61 127/70 128/72  Pulse:  77 71 72  Resp:  18 18 16   Temp:  99 F (37.2 C) 97.8 F (36.6 C) 98.1 F (36.7 C)  TempSrc:  Oral Oral Oral  SpO2: 92% 97% 98% 95%  Weight:   80 kg   Height:        Intake/Output Summary (Last 24 hours) at 03/04/2023 1049 Last data filed at 03/04/2023 1610 Gross per 24 hour  Intake 554.74 ml  Output 1000 ml  Net -445.26 ml   Filed Weights   03/03/23 1700 03/04/23 0545  Weight: 84 kg 80 kg    Physical Exam   General appearance: alert and no distress Neck: no carotid bruit, no JVD, and thyroid not enlarged, symmetric, no tenderness/mass/nodules Lungs: clear to auscultation bilaterally Heart: regular rate and rhythm, S1, S2 normal, no murmur, click, rub or gallop Abdomen: soft, non-tender; bowel sounds normal; no masses,  no organomegaly Extremities: extremities normal, atraumatic, no cyanosis or edema Pulses: 2+ and symmetric Skin: Skin color, texture, turgor normal. No rashes or lesions Neurologic: Grossly normal Psych: Pleasant  Inpatient Medications    Scheduled Meds:  amLODipine  10 mg Oral Daily   aspirin EC  81 mg Oral Daily   dapagliflozin propanediol  10 mg Oral Daily   insulin aspart  0-15 Units Subcutaneous TID  WC   [START ON 03/06/2023] rosuvastatin  10 mg Oral Once per day on Mon Fri   sodium chloride flush  3 mL Intravenous Q12H    Continuous Infusions:  sodium chloride     [START ON 03/05/2023] sodium chloride     Followed by   Melene Muller ON 03/05/2023] sodium chloride     heparin 1,400 Units/hr (03/04/23 0337)    PRN Meds: sodium chloride, acetaminophen, nitroGLYCERIN, ondansetron (ZOFRAN) IV, mouth rinse, sodium chloride flush   Labs   Results for orders placed or performed during the hospital encounter of 03/03/23 (from the past 48 hour(s))  Glucose, capillary     Status: Abnormal   Collection Time: 03/03/23 11:08 PM  Result Value Ref Range   Glucose-Capillary 262 (H) 70 - 99 mg/dL    Comment: Glucose reference range applies only to samples taken after fasting for at least 8 hours.  Hemoglobin A1c     Status: Abnormal   Collection Time: 03/04/23  1:07 AM  Result Value Ref Range   Hgb A1c MFr Bld 7.5 (H) 4.8 - 5.6 %    Comment: (NOTE) Pre diabetes:          5.7%-6.4%  Diabetes:              >6.4%  Glycemic control for   <7.0% adults with diabetes  Mean Plasma Glucose 168.55 mg/dL    Comment: Performed at [facility], [address], [place], [place] [address]  Basic metabolic panel     Status: Abnormal   Collection Time: 03/04/23  1:07 AM  Result Value Ref Range   Sodium 131 (L) 135 - 145 mmol/L   Potassium 3.4 (L) 3.5 - 5.1 mmol/L   Chloride 97 (L) 98 - 111 mmol/L   CO2 23 22 - 32 mmol/L   Glucose, Bld 194 (H) 70 - 99 mg/dL    Comment: Glucose reference range applies only to samples taken after fasting for at least 8 hours.   BUN 20 8 - 23 mg/dL   Creatinine, Ser 7.84 (H) 0.61 - 1.24 mg/dL   Calcium 9.1 8.9 - 69.6 mg/dL   GFR, Estimated >29 >52 mL/min    Comment: (NOTE) Calculated using the CKD-EPI Creatinine Equation (2021)    Anion gap 11 5 - 15    Comment: Performed at [facility], [address], [place], [place] [address]  CBC     Status: Abnormal    Collection Time: 03/04/23  1:07 AM  Result Value Ref Range   WBC 9.3 4.0 - 10.5 K/uL   RBC 4.73 4.22 - 5.81 MIL/uL   Hemoglobin 12.4 (L) 13.0 - 17.0 g/dL   HCT 44.0 10.2 - 72.5 %   MCV 83.1 80.0 - 100.0 fL   MCH 26.2 26.0 - 34.0 pg   MCHC 31.6 30.0 - 36.0 g/dL   RDW 36.6 44.0 - 34.7 %   Platelets 217 150 - 400 K/uL   nRBC 0.0 0.0 - 0.2 %    Comment: Performed at [facility], [address] [place], [place] [address]  APTT     Status: Abnormal   Collection Time: 03/04/23  1:07 AM  Result Value Ref Range   aPTT 45 (H) 24 - 36 seconds    Comment:        IF BASELINE aPTT IS ELEVATED, SUGGEST PATIENT RISK ASSESSMENT BE USED TO DETERMINE APPROPRIATE ANTICOAGULANT THERAPY. Performed at [facility], [address] [place], [place] [address]   Glucose, capillary     Status: Abnormal   Collection Time: 03/04/23  6:42 AM  Result Value Ref Range   Glucose-Capillary 158 (H) 70 - 99 mg/dL    Comment: Glucose reference range applies only to samples taken after fasting for at least 8 hours.    ECG   Afib at 74, lateral TWI, septal Q waves - Personally Reviewed  Telemetry   Sinus with PAC's - Personally Reviewed  Radiology    No results found.  Cardiac Studies   N/A  Assessment   Principal Problem:   NSTEMI (non-ST elevated myocardial infarction) (HCC)   Plan   No further chest pain. Vital stable. On IV heparin. Appears euvolemic. Creatinine 1.25. Plan for [facility] on Monday.  Time Spent Directly with Patient:  I have spent a total of 25 minutes with the patient reviewing hospital notes, telemetry, EKGs, labs and examining the patient as well as establishing an assessment and plan that was discussed personally with the patient.  > 50% of time was spent in direct patient care.  Length of Stay:  LOS: 1 day   Chrystie Nose, MD, [facility], FACP  [place]  [facility]  Medical Director of the Advanced Lipid Disorders &  Cardiovascular Risk Reduction  Clinic Diplomate of the American Board of Clinical Lipidology Attending Cardiologist  Direct Dial: [phone]  Fax: [phone]  Website:  [email or web address] 03/04/2023, 10:49 AM

## 2023-03-04 NOTE — Progress Notes (Addendum)
ANTICOAGULATION CONSULT NOTE - Initial Consult  Pharmacy Consult for Heparin Indication: chest pain/ACS  No Known Allergies  Patient Measurements: Height: 5\' 9"  (175.3 cm) Weight: 80 kg (176 lb 6.4 oz) IBW/kg (Calculated) : 70.7 Heparin Dosing Weight: 84 kg  Vital Signs: Temp: 98.1 F (36.7 C) (06/22 1051) Temp Source: Oral (06/22 1051) BP: 130/67 (06/22 1051) Pulse Rate: 58 (06/22 1051)  Labs: Recent Labs    03/04/23 0107 03/04/23 1041  HGB 12.4*  --   HCT 39.3  --   PLT 217  --   APTT 45* 61*  CREATININE 1.25*  --     Estimated Creatinine Clearance: 54.2 mL/min (A) (by C-G formula based on SCr of 1.25 mg/dL (H)).   Medical History: Past Medical History:  Diagnosis Date   A-fib [facility])    CAD (coronary artery disease)    Hypertension    Obstructive sleep apnea    CPAP   Rosacea    Type 2 diabetes mellitus with hyperglycemia (HCC)    Assessment: 65 YOM admitted for NSTEMI - cardiology planning LHC on 6/24. Pharmacy consulted to dose heparin. Takes Eliquis prior to admission - last dose 6/20 PM.  Will monitor with aPTT until levels are correlating.  aPTT today is subtherapeutic at 61, on 1400 units/hr. Hgb 12.4, plt 217--stable. No line issues or signs/symptoms of bleeding per nursing.   Goal of Therapy:  Heparin level 0.3-0.7 units/ml aPTT 66-102 seconds Monitor platelets by anticoagulation protocol: Yes   Plan:  Increase heparin to 1550 units/hr Check 8hr aPTT Daily heparin level/aPTT and CBC Monitor for signs/symptoms of bleeding   Cherylin Mylar, PharmD PGY1 Pharmacy Resident 6/22/202411:32 AM

## 2023-03-05 DIAGNOSIS — I214 Non-ST elevation (NSTEMI) myocardial infarction: Secondary | ICD-10-CM | POA: Diagnosis not present

## 2023-03-05 LAB — GLUCOSE, CAPILLARY
Glucose-Capillary: 176 mg/dL — ABNORMAL HIGH (ref 70–99)
Glucose-Capillary: 292 mg/dL — ABNORMAL HIGH (ref 70–99)
Glucose-Capillary: 245 mg/dL — ABNORMAL HIGH (ref 70–99)
Glucose-Capillary: 250 mg/dL — ABNORMAL HIGH (ref 70–99)

## 2023-03-05 LAB — CBC
HCT: 38.6 % — ABNORMAL LOW (ref 39.0–52.0)
Hemoglobin: 12 g/dL — ABNORMAL LOW (ref 13.0–17.0)
MCH: 25.5 pg — ABNORMAL LOW (ref 26.0–34.0)
MCHC: 31.1 g/dL (ref 30.0–36.0)
MCV: 82.1 fL (ref 80.0–100.0)
Platelets: 206 10*3/uL (ref 150–400)
RBC: 4.7 MIL/uL (ref 4.22–5.81)
RDW: 14.5 % (ref 11.5–15.5)
WBC: 8 10*3/uL (ref 4.0–10.5)
nRBC: 0 % (ref 0.0–0.2)

## 2023-03-05 LAB — APTT: aPTT: 92 seconds — ABNORMAL HIGH (ref 24–36)

## 2023-03-05 LAB — HEPARIN LEVEL (UNFRACTIONATED): Heparin Unfractionated: 0.63 IU/mL (ref 0.30–0.70)

## 2023-03-05 MED ORDER — SODIUM CHLORIDE 0.9 % WEIGHT BASED INFUSION
3.0000 mL/kg/h | INTRAVENOUS | Status: DC
  Administered 2023-03-06: 3 mL/kg/h via INTRAVENOUS

## 2023-03-05 MED ORDER — SODIUM CHLORIDE 0.9 % WEIGHT BASED INFUSION
1.0000 mL/kg/h | INTRAVENOUS | Status: DC
  Administered 2023-03-06 (×2): 1 mL/kg/h via INTRAVENOUS

## 2023-03-05 MED ORDER — ALUM & MAG HYDROXIDE-SIMETH 200-200-20 MG/5ML PO SUSP
30.0000 mL | ORAL | Status: DC | PRN
  Administered 2023-03-05: 30 mL via ORAL
  Filled 2023-03-05: qty 30

## 2023-03-05 NOTE — Progress Notes (Addendum)
  DAILY PROGRESS NOTE   Patient Name: [name] Date of Encounter: 03/05/2023 Cardiologist: James Hochrein, MD  Chief Complaint   No chest pain  Patient Profile   [name] is a 72 y.o. male with paoxysmal atrial fibrillation, CAD s/p PCI in 2001, s/p 5vCABG (LIMA-LAD, sequential SVG to RI and OM1, sequential SVG to PDA and PL in 2003), chronic diastolic heart fialure, mild MR, mild AS, HTN, HLD, PAD, CKD, DM, and OSA who is being seen 03/03/2023 for the evaluation of NSTEMI/afib.   Subjective   No overnight events - plan for [facility] tomorrow. NPO p MN -orders in. Echo yesterday shows LVEF 45-50% with severe apical hypokinesis. No significant change from prior study.  Objective   Vitals:   03/05/23 0718 03/05/23 0802 03/05/23 0807 03/05/23 1105  BP: 136/76  129/80 (!) 143/60  Pulse: 65  95 72  Resp: 19 16 16 20  Temp: 97.6 F (36.4 C)   98.1 F (36.7 C)  TempSrc: Oral   Oral  SpO2: 95%   93%  Weight:      Height:        Intake/Output Summary (Last 24 hours) at 03/05/2023 1304 Last data filed at 03/05/2023 1233 Gross per 24 hour  Intake 1238.15 ml  Output 2875 ml  Net -1636.85 ml   Filed Weights   03/03/23 1700 03/04/23 0545 03/05/23 0322  Weight: 84 kg 80 kg 80.8 kg    Physical Exam   General appearance: alert and no distress Neck: no carotid bruit, no JVD, and thyroid not enlarged, symmetric, no tenderness/mass/nodules Lungs: clear to auscultation bilaterally Heart: regular rate and rhythm, S1, S2 normal, no murmur, click, rub or gallop Abdomen: soft, non-tender; bowel sounds normal; no masses,  no organomegaly Extremities: extremities normal, atraumatic, no cyanosis or edema Pulses: 2+ and symmetric Skin: Skin color, texture, turgor normal. No rashes or lesions Neurologic: Grossly normal Psych: Pleasant  Inpatient Medications    Scheduled Meds:  amLODipine  10 mg Oral Daily   aspirin EC  81 mg Oral Daily   dapagliflozin propanediol  10 mg Oral  Daily   insulin aspart  0-15 Units Subcutaneous TID WC   [START ON 03/06/2023] rosuvastatin  10 mg Oral Once per day on Mon Fri   sodium chloride flush  3 mL Intravenous Q12H    Continuous Infusions:  sodium chloride     [START ON 03/06/2023] sodium chloride     Followed by   [START ON 03/06/2023] sodium chloride     heparin 1,500 Units/hr (03/05/23 1233)    PRN Meds: sodium chloride, acetaminophen, nitroGLYCERIN, ondansetron (ZOFRAN) IV, mouth rinse, sodium chloride flush   Labs   Results for orders placed or performed during the hospital encounter of 03/03/23 (from the past 48 hour(s))  Glucose, capillary     Status: Abnormal   Collection Time: 03/03/23 11:08 PM  Result Value Ref Range   Glucose-Capillary 262 (H) 70 - 99 mg/dL    Comment: Glucose reference range applies only to samples taken after fasting for at least 8 hours.  Hemoglobin A1c     Status: Abnormal   Collection Time: 03/04/23  1:07 AM  Result Value Ref Range   Hgb A1c MFr Bld 7.5 (H) 4.8 - 5.6 %    Comment: (NOTE) Pre diabetes:          5.7%-6.4%  Diabetes:              >6.4%  Glycemic control for   <  7.0% adults with diabetes    Mean Plasma Glucose 168.55 mg/dL    Comment: Performed at [facility], [address] [place], [place] [address]  Basic metabolic panel     Status: Abnormal   Collection Time: 03/04/23  1:07 AM  Result Value Ref Range   Sodium 131 (L) 135 - 145 mmol/L   Potassium 3.4 (L) 3.5 - 5.1 mmol/L   Chloride 97 (L) 98 - 111 mmol/L   CO2 23 22 - 32 mmol/L   Glucose, Bld 194 (H) 70 - 99 mg/dL    Comment: Glucose reference range applies only to samples taken after fasting for at least 8 hours.   BUN 20 8 - 23 mg/dL   Creatinine, Ser 1.25 (H) 0.61 - 1.24 mg/dL   Calcium 9.1 8.9 - 10.3 mg/dL   GFR, Estimated >60 >60 mL/min    Comment: (NOTE) Calculated using the CKD-EPI Creatinine Equation (2021)    Anion gap 11 5 - 15    Comment: Performed at [facility], [address] [place], [place] [address]  CBC     Status: Abnormal   Collection Time: 03/04/23  1:07 AM  Result Value Ref Range   WBC 9.3 4.0 - 10.5 K/uL   RBC 4.73 4.22 - 5.81 MIL/uL   Hemoglobin 12.4 (L) 13.0 - 17.0 g/dL   HCT 39.3 39.0 - 52.0 %   MCV 83.1 80.0 - 100.0 fL   MCH 26.2 26.0 - 34.0 pg   MCHC 31.6 30.0 - 36.0 g/dL   RDW 14.6 11.5 - 15.5 %   Platelets 217 150 - 400 K/uL   nRBC 0.0 0.0 - 0.2 %    Comment: Performed at [facility], [address] [place], [place] [address]  APTT     Status: Abnormal   Collection Time: 03/04/23  1:07 AM  Result Value Ref Range   aPTT 45 (H) 24 - 36 seconds    Comment:        IF BASELINE aPTT IS ELEVATED, SUGGEST PATIENT RISK ASSESSMENT BE USED TO DETERMINE APPROPRIATE ANTICOAGULANT THERAPY. Performed at [facility], [address] [place], [place] [address]   Glucose, capillary     Status: Abnormal   Collection Time: 03/04/23  6:42 AM  Result Value Ref Range   Glucose-Capillary 158 (H) 70 - 99 mg/dL    Comment: Glucose reference range applies only to samples taken after fasting for at least 8 hours.  APTT     Status: Abnormal   Collection Time: 03/04/23 10:41 AM  Result Value Ref Range   aPTT 61 (H) 24 - 36 seconds    Comment:        IF BASELINE aPTT IS ELEVATED, SUGGEST PATIENT RISK ASSESSMENT BE USED TO DETERMINE APPROPRIATE ANTICOAGULANT THERAPY. Performed at [facility], [address] [place], [place] [address]   Glucose, capillary     Status: Abnormal   Collection Time: 03/04/23 11:39 AM  Result Value Ref Range   Glucose-Capillary 324 (H) 70 - 99 mg/dL    Comment: Glucose reference range applies only to samples taken after fasting for at least 8 hours.   Comment 1 Notify RN    Comment 2 Document in Chart   APTT     Status: Abnormal   Collection Time: 03/04/23  7:43 PM  Result Value Ref Range   aPTT 89 (H) 24 - 36 seconds    Comment:          IF BASELINE aPTT IS ELEVATED, SUGGEST PATIENT RISK ASSESSMENT BE  USED TO DETERMINE APPROPRIATE ANTICOAGULANT THERAPY. Performed at [facility], [address] [place], [place] [address]   Glucose, capillary     Status: Abnormal   Collection Time: 03/04/23  9:03 PM  Result Value Ref Range   Glucose-Capillary 300 (H) 70 - 99 mg/dL    Comment: Glucose reference range applies only to samples taken after fasting for at least 8 hours.  Heparin level (unfractionated)     Status: None   Collection Time: 03/05/23  1:10 AM  Result Value Ref Range   Heparin Unfractionated 0.63 0.30 - 0.70 IU/mL    Comment: (NOTE) The clinical reportable range upper limit is being lowered to >1.10 to align with the FDA approved guidance for the current laboratory assay.  If heparin results are below expected values, and patient dosage has  been confirmed, suggest follow up testing of antithrombin III levels. Performed at [facility], [address] [place], [place] [address]   CBC     Status: Abnormal   Collection Time: 03/05/23  1:10 AM  Result Value Ref Range   WBC 8.0 4.0 - 10.5 K/uL   RBC 4.70 4.22 - 5.81 MIL/uL   Hemoglobin 12.0 (L) 13.0 - 17.0 g/dL   HCT 38.6 (L) 39.0 - 52.0 %   MCV 82.1 80.0 - 100.0 fL   MCH 25.5 (L) 26.0 - 34.0 pg   MCHC 31.1 30.0 - 36.0 g/dL   RDW 14.5 11.5 - 15.5 %   Platelets 206 150 - 400 K/uL   nRBC 0.0 0.0 - 0.2 %    Comment: Performed at [facility], [address] [place], [place] [address]  APTT     Status: Abnormal   Collection Time: 03/05/23  1:10 AM  Result Value Ref Range   aPTT 92 (H) 24 - 36 seconds    Comment:        IF BASELINE aPTT IS ELEVATED, SUGGEST PATIENT RISK ASSESSMENT BE USED TO DETERMINE APPROPRIATE ANTICOAGULANT THERAPY. Performed at [facility], [address] [place], [place] [address]   Glucose, capillary     Status: Abnormal   Collection Time: 03/05/23  5:17 AM  Result Value Ref Range   Glucose-Capillary 176 (H) 70 - 99 mg/dL    Comment: Glucose reference range applies  only to samples taken after fasting for at least 8 hours.  Glucose, capillary     Status: Abnormal   Collection Time: 03/05/23 11:02 AM  Result Value Ref Range   Glucose-Capillary 292 (H) 70 - 99 mg/dL    Comment: Glucose reference range applies only to samples taken after fasting for at least 8 hours.    ECG   N/A  Telemetry   Sinus with PAC's - Personally Reviewed  Radiology    ECHOCARDIOGRAM COMPLETE  Result Date: 03/04/2023    ECHOCARDIOGRAM REPORT   Patient Name:   [name] Date of Exam: 03/04/2023 Medical Rec #:  [id number]       Height:       69.0 in Accession #:    [id number]      Weight:       176.4 lb Date of Birth:  01/02/1951       BSA:          1.959 m Patient Age:    72 years        BP:             136/64 mmHg Patient Gender: M               HR:           79 bpm. Exam Location:  Inpatient Procedure: 2D Echo, Cardiac Doppler and Color Doppler Indications:    CHF  History:        Patient has prior history of Echocardiogram examinations, most                 recent 11/30/2022. CAD, Arrythmias:Atrial Fibrillation; Risk                 Factors:Hypertension and Diabetes.  Sonographer:    Samuel Clark Referring Phys: [id number] LINDSAY B ROBERTS IMPRESSIONS  1. Global mild LV hypokinesis with focal severe apical hypokinesis. Left ventricular ejection fraction, by estimation, is 45 to 50%. The left ventricle has mildly decreased function. The left ventricle demonstrates global hypokinesis. The left ventricular internal cavity size was moderately dilated. Left ventricular diastolic function could not be evaluated.  2. Right ventricular systolic function is mildly reduced. The right ventricular size is moderately enlarged. There is mildly elevated pulmonary artery systolic pressure.  3. Left atrial size was mildly dilated.  4. Right atrial size was mildly dilated.  5. The mitral valve is normal in structure. Trivial mitral valve regurgitation. No evidence of mitral stenosis.  6. The aortic valve  is grossly normal. There is mild calcification of the aortic valve. There is mild thickening of the aortic valve. Aortic valve regurgitation is not visualized. No aortic stenosis is present.  7. The inferior vena cava is normal in size with <50% respiratory variability, suggesting right atrial pressure of 8 mmHg. Comparison(s): No significant change from prior study. FINDINGS  Left Ventricle: Global mild LV hypokinesis with focal severe apical hypokinesis. Left ventricular ejection fraction, by estimation, is 45 to 50%. The left ventricle has mildly decreased function. The left ventricle demonstrates global hypokinesis. The left ventricular internal cavity size was moderately dilated. There is no left ventricular hypertrophy. Left ventricular diastolic function could not be evaluated due to atrial fibrillation. Left ventricular diastolic function could not be evaluated. Right Ventricle: The right ventricular size is moderately enlarged. Right vetricular wall thickness was not well visualized. Right ventricular systolic function is mildly reduced. There is mildly elevated pulmonary artery systolic pressure. The tricuspid  regurgitant velocity is 2.78 m/s, and with an assumed right atrial pressure of 8 mmHg, the estimated right ventricular systolic pressure is 38.9 mmHg. Left Atrium: Left atrial size was mildly dilated. Right Atrium: Right atrial size was mildly dilated. Pericardium: There is no evidence of pericardial effusion. Mitral Valve: The mitral valve is normal in structure. Trivial mitral valve regurgitation. No evidence of mitral valve stenosis. Tricuspid Valve: The tricuspid valve is normal in structure. Tricuspid valve regurgitation is trivial. No evidence of tricuspid stenosis. Aortic Valve: The aortic valve is grossly normal. There is mild calcification of the aortic valve. There is mild thickening of the aortic valve. Aortic valve regurgitation is not visualized. No aortic stenosis is present. Aortic  valve mean gradient measures 4.0 mmHg. Aortic valve peak gradient measures 6.5 mmHg. Aortic valve area, by VTI measures 2.58 cm. Pulmonic Valve: The pulmonic valve was not well visualized. Pulmonic valve regurgitation is not visualized. No evidence of pulmonic stenosis. Aorta: The aortic root, ascending aorta and aortic arch are all structurally normal, with no evidence of dilitation or obstruction. Venous: The inferior vena cava is normal in size with less than 50% respiratory variability, suggesting   right atrial pressure of 8 mmHg. IAS/Shunts: The interatrial septum was not well visualized.  LEFT VENTRICLE PLAX 2D LVIDd:         6.05 cm   Diastology LVIDs:         4.70 cm   LV e' medial:    4.03 cm/s LV PW:         1.00 cm   LV E/e' medial:  28.5 LV IVS:        0.85 cm   LV e' lateral:   9.14 cm/s LVOT diam:     2.00 cm   LV E/e' lateral: 12.6 LV SV:         59 LV SV Index:   30 LVOT Area:     3.14 cm  RIGHT VENTRICLE             IVC RV S prime:     10.20 cm/s  IVC diam: 1.80 cm TAPSE (M-mode): 1.4 cm LEFT ATRIUM             Index        RIGHT ATRIUM           Index LA Vol (A2C):   74.3 ml 37.93 ml/m  RA Area:     18.00 cm LA Vol (A4C):   42.7 ml 21.80 ml/m  RA Volume:   42.10 ml  21.49 ml/m LA Biplane Vol: 55.9 ml 28.54 ml/m  AORTIC VALVE AV Area (Vmax):    2.70 cm AV Area (Vmean):   2.59 cm AV Area (VTI):     2.58 cm AV Vmax:           127.00 cm/s AV Vmean:          88.900 cm/s AV VTI:            0.228 m AV Peak Grad:      6.5 mmHg AV Mean Grad:      4.0 mmHg LVOT Vmax:         109.00 cm/s LVOT Vmean:        73.200 cm/s LVOT VTI:          0.187 m LVOT/AV VTI ratio: 0.82  AORTA Ao Root diam: 3.10 cm Ao Asc diam:  3.40 cm MITRAL VALVE                TRICUSPID VALVE MV Area (PHT): 4.46 cm     TR Peak grad:   30.9 mmHg MV Decel Time: 170 msec     TR Vmax:        278.00 cm/s MV E velocity: 115.00 cm/s                             SHUNTS                             Systemic VTI:  0.19 m                              Systemic Diam: 2.00 cm Bridgette Christopher MD Electronically signed by Bridgette Christopher MD Signature Date/Time: 03/04/2023/3:12:09 PM    Final     Cardiac Studies   See echo above  Assessment   Principal Problem:   NSTEMI (non-ST elevated myocardial infarction) (HCC)   Plan   No new overnight events. Plan for LHC tomorrow. NPO p MN.   Cath orders placed.  Time Spent Directly with Patient:  I have spent a total of 25 minutes with the patient reviewing hospital notes, telemetry, EKGs, labs and examining the patient as well as establishing an assessment and plan that was discussed personally with the patient.  > 50% of time was spent in direct patient care.  Length of Stay:  LOS: 2 days   Teale Goodgame C. Deryl Giroux, MD, FACC, FACP  [place]  [facility]  Medical Director of the Advanced Lipid Disorders &  Cardiovascular Risk Reduction Clinic Diplomate of the American Board of Clinical Lipidology Attending Cardiologist  Direct Dial: [phone]  Fax: [phone]  Website:  [email or web address]  Zakhai Meisinger C Sadrac Zeoli 03/05/2023, 1:04 PM  

## 2023-03-05 NOTE — Plan of Care (Signed)
  Problem: Education: Goal: Knowledge of General Education information will improve Description: Including pain rating scale, medication(s)/side effects and non-pharmacologic comfort measures Outcome: Progressing   Problem: Clinical Measurements: Goal: Ability to maintain clinical measurements within normal limits will improve Outcome: Progressing Goal: Diagnostic test results will improve Outcome: Progressing   

## 2023-03-05 NOTE — H&P (View-Only) (Signed)
DAILY PROGRESS NOTE   Patient Name: [name] Date of Encounter: 03/05/2023 Cardiologist: Rollene Rotunda, MD  Chief Complaint   No chest pain  Patient Profile   [name] is a 72 y.o. male with paoxysmal atrial fibrillation, CAD s/p PCI in 2001, s/p 5vCABG (LIMA-LAD, sequential SVG to RI and OM1, sequential SVG to PDA and PL in 2003), chronic diastolic heart fialure, mild MR, mild AS, HTN, HLD, PAD, CKD, DM, and OSA who is being seen 03/03/2023 for the evaluation of NSTEMI/afib.   Subjective   No overnight events - plan for [facility] tomorrow. NPO p MN -orders in. Echo yesterday shows LVEF 45-50% with severe apical hypokinesis. No significant change from prior study.  Objective   Vitals:   03/05/23 0718 03/05/23 0802 03/05/23 0807 03/05/23 1105  BP: 136/76  129/80 (!) 143/60  Pulse: 65  95 72  Resp: 19 16 16 20   Temp: 97.6 F (36.4 C)   98.1 F (36.7 C)  TempSrc: Oral   Oral  SpO2: 95%   93%  Weight:      Height:        Intake/Output Summary (Last 24 hours) at 03/05/2023 1304 Last data filed at 03/05/2023 1233 Gross per 24 hour  Intake 1238.15 ml  Output 2875 ml  Net -1636.85 ml   Filed Weights   03/03/23 1700 03/04/23 0545 03/05/23 0322  Weight: 84 kg 80 kg 80.8 kg    Physical Exam   General appearance: alert and no distress Neck: no carotid bruit, no JVD, and thyroid not enlarged, symmetric, no tenderness/mass/nodules Lungs: clear to auscultation bilaterally Heart: regular rate and rhythm, S1, S2 normal, no murmur, click, rub or gallop Abdomen: soft, non-tender; bowel sounds normal; no masses,  no organomegaly Extremities: extremities normal, atraumatic, no cyanosis or edema Pulses: 2+ and symmetric Skin: Skin color, texture, turgor normal. No rashes or lesions Neurologic: Grossly normal Psych: Pleasant  Inpatient Medications    Scheduled Meds:  amLODipine  10 mg Oral Daily   aspirin EC  81 mg Oral Daily   dapagliflozin propanediol  10 mg Oral  Daily   insulin aspart  0-15 Units Subcutaneous TID WC   [START ON 03/06/2023] rosuvastatin  10 mg Oral Once per day on Mon Fri   sodium chloride flush  3 mL Intravenous Q12H    Continuous Infusions:  sodium chloride     [START ON 03/06/2023] sodium chloride     Followed by   Melene Muller ON 03/06/2023] sodium chloride     heparin 1,500 Units/hr (03/05/23 1233)    PRN Meds: sodium chloride, acetaminophen, nitroGLYCERIN, ondansetron (ZOFRAN) IV, mouth rinse, sodium chloride flush   Labs   Results for orders placed or performed during the hospital encounter of 03/03/23 (from the past 48 hour(s))  Glucose, capillary     Status: Abnormal   Collection Time: 03/03/23 11:08 PM  Result Value Ref Range   Glucose-Capillary 262 (H) 70 - 99 mg/dL    Comment: Glucose reference range applies only to samples taken after fasting for at least 8 hours.  Hemoglobin A1c     Status: Abnormal   Collection Time: 03/04/23  1:07 AM  Result Value Ref Range   Hgb A1c MFr Bld 7.5 (H) 4.8 - 5.6 %    Comment: (NOTE) Pre diabetes:          5.7%-6.4%  Diabetes:              >6.4%  Glycemic control for   <  7.0% adults with diabetes    Mean Plasma Glucose 168.55 mg/dL    Comment: Performed at [facility], [address] [place], [place] [address]  Basic metabolic panel     Status: Abnormal   Collection Time: 03/04/23  1:07 AM  Result Value Ref Range   Sodium 131 (L) 135 - 145 mmol/L   Potassium 3.4 (L) 3.5 - 5.1 mmol/L   Chloride 97 (L) 98 - 111 mmol/L   CO2 23 22 - 32 mmol/L   Glucose, Bld 194 (H) 70 - 99 mg/dL    Comment: Glucose reference range applies only to samples taken after fasting for at least 8 hours.   BUN 20 8 - 23 mg/dL   Creatinine, Ser 9.56 (H) 0.61 - 1.24 mg/dL   Calcium 9.1 8.9 - 21.3 mg/dL   GFR, Estimated >08 >65 mL/min    Comment: (NOTE) Calculated using the CKD-EPI Creatinine Equation (2021)    Anion gap 11 5 - 15    Comment: Performed at [facility], [address], [place], [place] [address]  CBC     Status: Abnormal   Collection Time: 03/04/23  1:07 AM  Result Value Ref Range   WBC 9.3 4.0 - 10.5 K/uL   RBC 4.73 4.22 - 5.81 MIL/uL   Hemoglobin 12.4 (L) 13.0 - 17.0 g/dL   HCT 62.9 52.8 - 41.3 %   MCV 83.1 80.0 - 100.0 fL   MCH 26.2 26.0 - 34.0 pg   MCHC 31.6 30.0 - 36.0 g/dL   RDW 24.4 01.0 - 27.2 %   Platelets 217 150 - 400 K/uL   nRBC 0.0 0.0 - 0.2 %    Comment: Performed at [facility], [address], [place], [place] [address]  APTT     Status: Abnormal   Collection Time: 03/04/23  1:07 AM  Result Value Ref Range   aPTT 45 (H) 24 - 36 seconds    Comment:        IF BASELINE aPTT IS ELEVATED, SUGGEST PATIENT RISK ASSESSMENT BE USED TO DETERMINE APPROPRIATE ANTICOAGULANT THERAPY. Performed at [facility], [address], [place], [place] [address]   Glucose, capillary     Status: Abnormal   Collection Time: 03/04/23  6:42 AM  Result Value Ref Range   Glucose-Capillary 158 (H) 70 - 99 mg/dL    Comment: Glucose reference range applies only to samples taken after fasting for at least 8 hours.  APTT     Status: Abnormal   Collection Time: 03/04/23 10:41 AM  Result Value Ref Range   aPTT 61 (H) 24 - 36 seconds    Comment:        IF BASELINE aPTT IS ELEVATED, SUGGEST PATIENT RISK ASSESSMENT BE USED TO DETERMINE APPROPRIATE ANTICOAGULANT THERAPY. Performed at [facility], [address], [place], [place] [address]   Glucose, capillary     Status: Abnormal   Collection Time: 03/04/23 11:39 AM  Result Value Ref Range   Glucose-Capillary 324 (H) 70 - 99 mg/dL    Comment: Glucose reference range applies only to samples taken after fasting for at least 8 hours.   Comment 1 Notify RN    Comment 2 Document in Chart   APTT     Status: Abnormal   Collection Time: 03/04/23  7:43 PM  Result Value Ref Range   aPTT 89 (H) 24 - 36 seconds    Comment:  IF BASELINE aPTT IS ELEVATED, SUGGEST PATIENT RISK ASSESSMENT BE  USED TO DETERMINE APPROPRIATE ANTICOAGULANT THERAPY. Performed at [facility], [address], [place], [place] [address]   Glucose, capillary     Status: Abnormal   Collection Time: 03/04/23  9:03 PM  Result Value Ref Range   Glucose-Capillary 300 (H) 70 - 99 mg/dL    Comment: Glucose reference range applies only to samples taken after fasting for at least 8 hours.  Heparin level (unfractionated)     Status: None   Collection Time: 03/05/23  1:10 AM  Result Value Ref Range   Heparin Unfractionated 0.63 0.30 - 0.70 IU/mL    Comment: (NOTE) The clinical reportable range upper limit is being lowered to >1.10 to align with the FDA approved guidance for the current laboratory assay.  If heparin results are below expected values, and patient dosage has  been confirmed, suggest follow up testing of antithrombin III levels. Performed at [facility], [address], [place], [place] [address]   CBC     Status: Abnormal   Collection Time: 03/05/23  1:10 AM  Result Value Ref Range   WBC 8.0 4.0 - 10.5 K/uL   RBC 4.70 4.22 - 5.81 MIL/uL   Hemoglobin 12.0 (L) 13.0 - 17.0 g/dL   HCT 08.6 (L) 57.8 - 46.9 %   MCV 82.1 80.0 - 100.0 fL   MCH 25.5 (L) 26.0 - 34.0 pg   MCHC 31.1 30.0 - 36.0 g/dL   RDW 62.9 52.8 - 41.3 %   Platelets 206 150 - 400 K/uL   nRBC 0.0 0.0 - 0.2 %    Comment: Performed at [facility], [address] [place], [place] [address]  APTT     Status: Abnormal   Collection Time: 03/05/23  1:10 AM  Result Value Ref Range   aPTT 92 (H) 24 - 36 seconds    Comment:        IF BASELINE aPTT IS ELEVATED, SUGGEST PATIENT RISK ASSESSMENT BE USED TO DETERMINE APPROPRIATE ANTICOAGULANT THERAPY. Performed at [facility], [address], [place], [place] [address]   Glucose, capillary     Status: Abnormal   Collection Time: 03/05/23  5:17 AM  Result Value Ref Range   Glucose-Capillary 176 (H) 70 - 99 mg/dL    Comment: Glucose reference range applies  only to samples taken after fasting for at least 8 hours.  Glucose, capillary     Status: Abnormal   Collection Time: 03/05/23 11:02 AM  Result Value Ref Range   Glucose-Capillary 292 (H) 70 - 99 mg/dL    Comment: Glucose reference range applies only to samples taken after fasting for at least 8 hours.    ECG   N/A  Telemetry   Sinus with PAC's - Personally Reviewed  Radiology    ECHOCARDIOGRAM COMPLETE  Result Date: 03/04/2023    ECHOCARDIOGRAM REPORT   Patient Name:   [name] Date of Exam: 03/04/2023 Medical Rec #:  [id number]       Height:       69.0 in Accession #:    [id number]      Weight:       176.4 lb Date of Birth:  23-Dec-1950       BSA:          1.959 m Patient Age:    72 years        BP:  136/64 mmHg Patient Gender: M               HR:           79 bpm. Exam Location:  Inpatient Procedure: 2D Echo, Cardiac Doppler and Color Doppler Indications:    CHF  History:        Patient has prior history of Echocardiogram examinations, most                 recent 11/30/2022. CAD, Arrythmias:Atrial Fibrillation; Risk                 Factors:Hypertension and Diabetes.  Sonographer:    Darlys Gales Referring Phys: [phone] LINDSAY B ROBERTS IMPRESSIONS  1. Global mild LV hypokinesis with focal severe apical hypokinesis. Left ventricular ejection fraction, by estimation, is 45 to 50%. The left ventricle has mildly decreased function. The left ventricle demonstrates global hypokinesis. The left ventricular internal cavity size was moderately dilated. Left ventricular diastolic function could not be evaluated.  2. Right ventricular systolic function is mildly reduced. The right ventricular size is moderately enlarged. There is mildly elevated pulmonary artery systolic pressure.  3. Left atrial size was mildly dilated.  4. Right atrial size was mildly dilated.  5. The mitral valve is normal in structure. Trivial mitral valve regurgitation. No evidence of mitral stenosis.  6. The aortic valve  is grossly normal. There is mild calcification of the aortic valve. There is mild thickening of the aortic valve. Aortic valve regurgitation is not visualized. No aortic stenosis is present.  7. The inferior vena cava is normal in size with <50% respiratory variability, suggesting right atrial pressure of 8 mmHg. Comparison(s): No significant change from prior study. FINDINGS  Left Ventricle: Global mild LV hypokinesis with focal severe apical hypokinesis. Left ventricular ejection fraction, by estimation, is 45 to 50%. The left ventricle has mildly decreased function. The left ventricle demonstrates global hypokinesis. The left ventricular internal cavity size was moderately dilated. There is no left ventricular hypertrophy. Left ventricular diastolic function could not be evaluated due to atrial fibrillation. Left ventricular diastolic function could not be evaluated. Right Ventricle: The right ventricular size is moderately enlarged. Right vetricular wall thickness was not well visualized. Right ventricular systolic function is mildly reduced. There is mildly elevated pulmonary artery systolic pressure. The tricuspid  regurgitant velocity is 2.78 m/s, and with an assumed right atrial pressure of 8 mmHg, the estimated right ventricular systolic pressure is 38.9 mmHg. Left Atrium: Left atrial size was mildly dilated. Right Atrium: Right atrial size was mildly dilated. Pericardium: There is no evidence of pericardial effusion. Mitral Valve: The mitral valve is normal in structure. Trivial mitral valve regurgitation. No evidence of mitral valve stenosis. Tricuspid Valve: The tricuspid valve is normal in structure. Tricuspid valve regurgitation is trivial. No evidence of tricuspid stenosis. Aortic Valve: The aortic valve is grossly normal. There is mild calcification of the aortic valve. There is mild thickening of the aortic valve. Aortic valve regurgitation is not visualized. No aortic stenosis is present. Aortic  valve mean gradient measures 4.0 mmHg. Aortic valve peak gradient measures 6.5 mmHg. Aortic valve area, by VTI measures 2.58 cm. Pulmonic Valve: The pulmonic valve was not well visualized. Pulmonic valve regurgitation is not visualized. No evidence of pulmonic stenosis. Aorta: The aortic root, ascending aorta and aortic arch are all structurally normal, with no evidence of dilitation or obstruction. Venous: The inferior vena cava is normal in size with less than 50% respiratory variability, suggesting  right atrial pressure of 8 mmHg. IAS/Shunts: The interatrial septum was not well visualized.  LEFT VENTRICLE PLAX 2D LVIDd:         6.05 cm   Diastology LVIDs:         4.70 cm   LV e' medial:    4.03 cm/s LV PW:         1.00 cm   LV E/e' medial:  28.5 LV IVS:        0.85 cm   LV e' lateral:   9.14 cm/s LVOT diam:     2.00 cm   LV E/e' lateral: 12.6 LV SV:         59 LV SV Index:   30 LVOT Area:     3.14 cm  RIGHT VENTRICLE             IVC RV S prime:     10.20 cm/s  IVC diam: 1.80 cm TAPSE (M-mode): 1.4 cm LEFT ATRIUM             Index        RIGHT ATRIUM           Index LA Vol (A2C):   74.3 ml 37.93 ml/m  RA Area:     18.00 cm LA Vol (A4C):   42.7 ml 21.80 ml/m  RA Volume:   42.10 ml  21.49 ml/m LA Biplane Vol: 55.9 ml 28.54 ml/m  AORTIC VALVE AV Area (Vmax):    2.70 cm AV Area (Vmean):   2.59 cm AV Area (VTI):     2.58 cm AV Vmax:           127.00 cm/s AV Vmean:          88.900 cm/s AV VTI:            0.228 m AV Peak Grad:      6.5 mmHg AV Mean Grad:      4.0 mmHg LVOT Vmax:         109.00 cm/s LVOT Vmean:        73.200 cm/s LVOT VTI:          0.187 m LVOT/AV VTI ratio: 0.82  AORTA Ao Root diam: 3.10 cm Ao Asc diam:  3.40 cm MITRAL VALVE                TRICUSPID VALVE MV Area (PHT): 4.46 cm     TR Peak grad:   30.9 mmHg MV Decel Time: 170 msec     TR Vmax:        278.00 cm/s MV E velocity: 115.00 cm/s                             SHUNTS                             Systemic VTI:  0.19 m                              Systemic Diam: 2.00 cm Jodelle Red MD Electronically signed by Jodelle Red MD Signature Date/Time: 03/04/2023/3:12:09 PM    Final     Cardiac Studies   See echo above  Assessment   Principal Problem:   NSTEMI (non-ST elevated myocardial infarction) (HCC)   Plan   No new overnight events. Plan for [facility] tomorrow. NPO p MN.  Cath orders placed.  Time Spent Directly with Patient:  I have spent a total of 25 minutes with the patient reviewing hospital notes, telemetry, EKGs, labs and examining the patient as well as establishing an assessment and plan that was discussed personally with the patient.  > 50% of time was spent in direct patient care.  Length of Stay:  LOS: 2 days   Chrystie Nose, MD, [facility], FACP  [place]  [facility]  Medical Director of the [facility] Diplomate of the American Board of Clinical Lipidology Attending Cardiologist  Direct Dial: [phone]  Fax: [phone]  Website:  [email or web address] 03/05/2023, 1:04 PM

## 2023-03-05 NOTE — Progress Notes (Signed)
ANTICOAGULATION CONSULT NOTE - Initial Consult  Pharmacy Consult for Heparin Indication: chest pain/ACS  No Known Allergies  Patient Measurements: Height: 5\' 9"  (175.3 cm) Weight: 80.8 kg (178 lb 1.6 oz) IBW/kg (Calculated) : 70.7 Heparin Dosing Weight: 84 kg  Vital Signs: Temp: 97.6 F (36.4 C) (06/23 0718) Temp Source: Oral (06/23 0718) BP: 136/76 (06/23 0718) Pulse Rate: 65 (06/23 0718)  Labs: Recent Labs    03/04/23 0107 03/04/23 1041 03/04/23 1943 03/05/23 0110  HGB 12.4*  --   --  12.0*  HCT 39.3  --   --  38.6*  PLT 217  --   --  206  APTT 45* 61* 89* 92*  HEPARINUNFRC  --   --   --  0.63  CREATININE 1.25*  --   --   --     Estimated Creatinine Clearance: 54.2 mL/min (A) (by C-G formula based on SCr of 1.25 mg/dL (H)).   Medical History: Past Medical History:  Diagnosis Date   A-fib [facility])    CAD (coronary artery disease)    Hypertension    Obstructive sleep apnea    CPAP   Rosacea    Type 2 diabetes mellitus with hyperglycemia (HCC)    Assessment: 79 YOM admitted for NSTEMI - cardiology planning LHC on 6/24. Pharmacy consulted to dose heparin. Takes Eliquis prior to admission - last dose 6/20 PM.  Will monitor with aPTT until levels are correlating.  Heparin level today is therapeutic at 0.63, on 1500 units/hr. aPTT is also therapeutic at 92--aPTT and heparin levels appear to be correlating now. Hgb 12, plt 206--stable. No line issues or signs/symptoms of bleeding reported.  Goal of Therapy:  Heparin level 0.3-0.7 units/ml aPTT 66-102 seconds Monitor platelets by anticoagulation protocol: Yes   Plan:  Continue heparin drip at 1500 units/hr Daily heparin level and CBC Monitor for signs/symptoms of bleeding   Cherylin Mylar, PharmD PGY1 Pharmacy Resident 6/23/20247:40 AM

## 2023-03-06 ENCOUNTER — Encounter (HOSPITAL_COMMUNITY)
Admission: EM | Disposition: A | Discharge status: Home / Self Care | Payer: Self-pay | Source: Other Acute Inpatient Hospital | Attending: Cardiology

## 2023-03-06 DIAGNOSIS — I471 Supraventricular tachycardia, unspecified: Secondary | ICD-10-CM | POA: Diagnosis not present

## 2023-03-06 DIAGNOSIS — I25810 Atherosclerosis of coronary artery bypass graft(s) without angina pectoris: Secondary | ICD-10-CM | POA: Diagnosis not present

## 2023-03-06 DIAGNOSIS — E785 Hyperlipidemia, unspecified: Secondary | ICD-10-CM

## 2023-03-06 DIAGNOSIS — I5022 Chronic systolic (congestive) heart failure: Secondary | ICD-10-CM | POA: Diagnosis not present

## 2023-03-06 DIAGNOSIS — I214 Non-ST elevation (NSTEMI) myocardial infarction: Secondary | ICD-10-CM | POA: Diagnosis not present

## 2023-03-06 DIAGNOSIS — I2510 Atherosclerotic heart disease of native coronary artery without angina pectoris: Secondary | ICD-10-CM

## 2023-03-06 HISTORY — PX: LEFT HEART CATH AND CORS/GRAFTS ANGIOGRAPHY: CATH118250

## 2023-03-06 LAB — BASIC METABOLIC PANEL
Anion gap: 14 (ref 5–15)
Anion gap: 10 (ref 5–15)
BUN: 29 mg/dL — ABNORMAL HIGH (ref 8–23)
BUN: 22 mg/dL (ref 8–23)
CO2: 21 mmol/L — ABNORMAL LOW (ref 22–32)
CO2: 20 mmol/L — ABNORMAL LOW (ref 22–32)
Calcium: 9.2 mg/dL (ref 8.9–10.3)
Calcium: 8.8 mg/dL — ABNORMAL LOW (ref 8.9–10.3)
Chloride: 100 mmol/L (ref 98–111)
Chloride: 104 mmol/L (ref 98–111)
Creatinine, Ser: 1.57 mg/dL — ABNORMAL HIGH (ref 0.61–1.24)
Creatinine, Ser: 1.12 mg/dL (ref 0.61–1.24)
GFR, Estimated: 47 mL/min — ABNORMAL LOW (ref >60)
GFR, Estimated: >60 mL/min (ref >60)
Glucose, Bld: 188 mg/dL — ABNORMAL HIGH (ref 70–99)
Glucose, Bld: 109 mg/dL — ABNORMAL HIGH (ref 70–99)
Potassium: 4 mmol/L (ref 3.5–5.1)
Potassium: 3.6 mmol/L (ref 3.5–5.1)
Sodium: 135 mmol/L (ref 135–145)
Sodium: 134 mmol/L — ABNORMAL LOW (ref 135–145)

## 2023-03-06 LAB — GLUCOSE, CAPILLARY
Glucose-Capillary: 245 mg/dL — ABNORMAL HIGH (ref 70–99)
Glucose-Capillary: 201 mg/dL — ABNORMAL HIGH (ref 70–99)
Glucose-Capillary: 157 mg/dL — ABNORMAL HIGH (ref 70–99)
Glucose-Capillary: 100 mg/dL — ABNORMAL HIGH (ref 70–99)
Glucose-Capillary: 188 mg/dL — ABNORMAL HIGH (ref 70–99)

## 2023-03-06 LAB — CBC
HCT: 40.6 % (ref 39.0–52.0)
Hemoglobin: 12.7 g/dL — ABNORMAL LOW (ref 13.0–17.0)
MCH: 26.5 pg (ref 26.0–34.0)
MCHC: 31.3 g/dL (ref 30.0–36.0)
MCV: 84.8 fL (ref 80.0–100.0)
Platelets: 225 10*3/uL (ref 150–400)
RBC: 4.79 MIL/uL (ref 4.22–5.81)
RDW: 14.6 % (ref 11.5–15.5)
WBC: 10.7 10*3/uL — ABNORMAL HIGH (ref 4.0–10.5)
nRBC: 0 % (ref 0.0–0.2)

## 2023-03-06 LAB — APTT: aPTT: 64 seconds — ABNORMAL HIGH (ref 24–36)

## 2023-03-06 LAB — HEPARIN LEVEL (UNFRACTIONATED): Heparin Unfractionated: 0.43 IU/mL (ref 0.30–0.70)

## 2023-03-06 SURGERY — LEFT HEART CATH AND CORS/GRAFTS ANGIOGRAPHY
Anesthesia: LOCAL

## 2023-03-06 MED ORDER — PANTOPRAZOLE SODIUM 40 MG PO TBEC
40.0000 mg | DELAYED_RELEASE_TABLET | Freq: Once | ORAL | Status: AC
  Administered 2023-03-06: 40 mg via ORAL
  Filled 2023-03-06: qty 1

## 2023-03-06 MED ORDER — HEPARIN (PORCINE) 25000 UT/250ML-% IV SOLN
1500.0000 [IU]/h | INTRAVENOUS | Status: DC
  Administered 2023-03-07: 1500 [IU]/h via INTRAVENOUS
  Filled 2023-03-06: qty 250

## 2023-03-06 MED ORDER — MIDAZOLAM HCL 2 MG/2ML IJ SOLN
INTRAMUSCULAR | Status: DC | PRN
  Administered 2023-03-06: 1 mg via INTRAVENOUS

## 2023-03-06 MED ORDER — APIXABAN 5 MG PO TABS
5.0000 mg | ORAL_TABLET | Freq: Two times a day (BID) | ORAL | Status: DC
  Administered 2023-03-07: 5 mg via ORAL
  Filled 2023-03-06: qty 1

## 2023-03-06 MED ORDER — HEPARIN (PORCINE) IN NACL 1000-0.9 UT/500ML-% IV SOLN
INTRAVENOUS | Status: DC | PRN
  Administered 2023-03-06 (×2): 500 mL

## 2023-03-06 MED ORDER — LIDOCAINE HCL (PF) 1 % IJ SOLN
INTRAMUSCULAR | Status: DC | PRN
  Administered 2023-03-06: 20 mL

## 2023-03-06 MED ORDER — SODIUM CHLORIDE 0.9% FLUSH
3.0000 mL | Freq: Two times a day (BID) | INTRAVENOUS | Status: DC
  Administered 2023-03-06 – 2023-03-07 (×2): 3 mL via INTRAVENOUS

## 2023-03-06 MED ORDER — IOHEXOL 350 MG/ML SOLN
INTRAVENOUS | Status: DC | PRN
  Administered 2023-03-06: 80 mL

## 2023-03-06 MED ORDER — SODIUM CHLORIDE 0.9 % IV SOLN: 250.0000 mL | INTRAVENOUS | Status: DC | PRN

## 2023-03-06 MED ORDER — FENTANYL CITRATE (PF) 100 MCG/2ML IJ SOLN
INTRAMUSCULAR | Status: AC
  Filled 2023-03-06: qty 2

## 2023-03-06 MED ORDER — MIDAZOLAM HCL 2 MG/2ML IJ SOLN
INTRAMUSCULAR | Status: AC
  Filled 2023-03-06: qty 2

## 2023-03-06 MED ORDER — SODIUM CHLORIDE 0.9 % IV SOLN: INTRAVENOUS | Status: AC

## 2023-03-06 MED ORDER — SODIUM CHLORIDE 0.9% FLUSH: 3.0000 mL | INTRAVENOUS | Status: DC | PRN

## 2023-03-06 MED ORDER — LABETALOL HCL 5 MG/ML IV SOLN: 10.0000 mg | INTRAVENOUS | Status: AC | PRN

## 2023-03-06 MED ORDER — LIDOCAINE HCL (PF) 1 % IJ SOLN
INTRAMUSCULAR | Status: AC
  Filled 2023-03-06: qty 30

## 2023-03-06 MED ORDER — HYDRALAZINE HCL 20 MG/ML IJ SOLN: 10.0000 mg | INTRAMUSCULAR | Status: AC | PRN

## 2023-03-06 MED ORDER — FENTANYL CITRATE (PF) 100 MCG/2ML IJ SOLN
INTRAMUSCULAR | Status: DC | PRN
  Administered 2023-03-06: 25 ug via INTRAVENOUS

## 2023-03-06 SURGICAL SUPPLY — 10 items
CATH INFINITI 5FR MULTPACK ANG (CATHETERS) IMPLANT
CLOSURE MYNX CONTROL 5F (Vascular Products) IMPLANT
KIT HEART LEFT (KITS) ×1 IMPLANT
KIT MICROPUNCTURE NIT STIFF (SHEATH) IMPLANT
PACK CARDIAC CATHETERIZATION (CUSTOM PROCEDURE TRAY) ×1 IMPLANT
SHEATH PINNACLE 5F 10CM (SHEATH) IMPLANT
SYR MEDRAD MARK 7 150ML (SYRINGE) ×1 IMPLANT
TRANSDUCER W/STOPCOCK (MISCELLANEOUS) ×1 IMPLANT
TUBING CIL FLEX 10 FLL-RA (TUBING) ×1 IMPLANT
WIRE EMERALD 3MM-J .035X150CM (WIRE) IMPLANT

## 2023-03-06 NOTE — Progress Notes (Signed)
ANTICOAGULATION CONSULT NOTE  Pharmacy Consult for Heparin Indication: chest pain/ACS  No Known Allergies  Patient Measurements: Height: 5\' 9"  (175.3 cm) Weight: 81.7 kg (180 lb 1.6 oz) IBW/kg (Calculated) : 70.7 Heparin Dosing Weight: 84 kg  Vital Signs: Temp: 97.6 F (36.4 C) (06/24 1818) Temp Source: Oral (06/24 1818) BP: 134/91 (06/24 1818) Pulse Rate: 87 (06/24 1818)  Labs: Recent Labs    03/04/23 0107 03/04/23 1041 03/04/23 1943 03/05/23 0110 03/06/23 0045 03/06/23 1318  HGB 12.4*  --   --  12.0* 12.7*  --   HCT 39.3  --   --  38.6* 40.6  --   PLT 217  --   --  206 225  --   APTT 45*   < > 89* 92* 64*  --   HEPARINUNFRC  --   --   --  0.63 0.43  --   CREATININE 1.25*  --   --   --  1.57* 1.12   < > = values in this interval not displayed.    Estimated Creatinine Clearance: 60.5 mL/min (by C-G formula based on SCr of 1.12 mg/dL).   Medical History: Past Medical History:  Diagnosis Date   A-fib [facility])    CAD (coronary artery disease)    Hypertension    Obstructive sleep apnea    CPAP   Rosacea    Type 2 diabetes mellitus with hyperglycemia (HCC)    Assessment: 65 YOM admitted for NSTEMI - cardiology planning LHC on 6/24. Pharmacy consulted to dose heparin. Takes Eliquis prior to admission - last dose 6/20 PM.  S/p cath which showed no acute lesion. To restart heparin 8 hours post sheath removal (sheath removed ~1800) and discontinue when Eliquis restarted tomorrow at 10a.  Goal of Therapy:  Heparin level 0.3-0.7 units/ml aPTT 66-102 seconds Monitor platelets by anticoagulation protocol: Yes   Plan:  At 0200, restart heparin 1500 units/hr Heparin will d/c at 1000 when Eliquis dose given No levels as heparin will only be running for ~8 hours  Christoper Fabian, PharmD, BCPS Please see amion for complete clinical pharmacist phone list 03/06/2023 6:28 PM

## 2023-03-06 NOTE — Care Management Important Message (Signed)
Important Message  Patient Details  Name: [name] MRN: [id number] Date of Birth: 1951-04-27   Medicare Important Message Given:  Yes     Renie Ora 03/06/2023, 10:19 AM

## 2023-03-06 NOTE — Interval H&P Note (Signed)
History and Physical Interval Note:  03/06/2023 5:18 PM  [name]  has presented today for surgery, with the diagnosis of nstemi.  The various methods of treatment have been discussed with the patient and family. After consideration of risks, benefits and other options for treatment, the patient has consented to  Procedure(s): LEFT HEART CATH AND CORS/GRAFTS ANGIOGRAPHY (N/A)  PERCUTANEOUS CORONARY INTERVENTION   as a surgical intervention.  The patient's history has been reviewed, patient examined, no change in status, stable for surgery.  I have reviewed the patient's chart and labs.  Questions were answered to the patient's satisfaction.     Cath Lab Visit (complete for each Cath Lab visit)  Clinical Evaluation Leading to the Procedure:   ACS: Yes.    Non-ACS:    Anginal Classification: CCS III  Anti-ischemic medical therapy: Maximal Therapy (2 or more classes of medications)  Non-Invasive Test Results: No non-invasive testing performed  Prior CABG: Previous CABG    Bryan Lemma

## 2023-03-06 NOTE — Progress Notes (Signed)
Rounding Note    Patient Name: [name] Date of Encounter: 03/06/2023  [facility] Cardiologist: Rollene Rotunda, MD   Subjective   Patient denies chest pain, shortness of breath, ankle edema, palpitations. On for LHC today. No questions or concerns prior to his procedure.   Inpatient Medications    Scheduled Meds:  amLODipine  10 mg Oral Daily   aspirin EC  81 mg Oral Daily   dapagliflozin propanediol  10 mg Oral Daily   insulin aspart  0-15 Units Subcutaneous TID WC   rosuvastatin  10 mg Oral Once per day on Mon Fri   sodium chloride flush  3 mL Intravenous Q12H   Continuous Infusions:  sodium chloride     sodium chloride 1 mL/kg/hr (03/06/23 0740)   heparin 1,500 Units/hr (03/05/23 1233)   PRN Meds: sodium chloride, acetaminophen, alum & mag hydroxide-simeth, nitroGLYCERIN, ondansetron (ZOFRAN) IV, mouth rinse, sodium chloride flush   Vital Signs    Vitals:   03/05/23 2009 03/06/23 0019 03/06/23 0424 03/06/23 0749  BP: 132/75 (!) 140/61 (!) 121/58 118/76  Pulse: 67 100 (!) 48 75  Resp: 18 18 18 19   Temp: 98.6 F (37 C) 98.1 F (36.7 C) 98.5 F (36.9 C) 98.2 F (36.8 C)  TempSrc: Oral Oral Oral Oral  SpO2: 98% 93% 98% 92%  Weight:   81.7 kg   Height:        Intake/Output Summary (Last 24 hours) at 03/06/2023 0904 Last data filed at 03/06/2023 0617 Gross per 24 hour  Intake 889.47 ml  Output 2775 ml  Net -1885.53 ml      03/06/2023    4:24 AM 03/05/2023    3:22 AM 03/04/2023    5:45 AM  Last 3 Weights  Weight (lbs) 180 lb 1.6 oz 178 lb 1.6 oz 176 lb 6.4 oz  Weight (kg) 81.693 kg 80.786 kg 80.015 kg      Telemetry    Predominantly sinus rhythm with PACs and PVCs. He did have a 10 minute episode of atrial flutter around 0600 today with HR 110s  - Personally Reviewed  ECG    No new tracings - Personally Reviewed  Physical Exam   GEN: No acute distress.  Sitting upright in the bed  Neck: No JVD Cardiac: RRR, no murmurs, rubs, or  gallops. Radial pulses 2+ bilaterally Respiratory: Clear to auscultation bilaterally. Normal work of breathing on room air GI: Soft, nontender, non-distended  MS: No edema in BLE; No deformity. Neuro:  Nonfocal  Psych: Normal affect   Labs    High Sensitivity Troponin:  No results for input(s): "TROPONINIHS" in the last 720 hours.   Chemistry Recent Labs  Lab 03/04/23 0107  NA 131*  K 3.4*  CL 97*  CO2 23  GLUCOSE 194*  BUN 20  CREATININE 1.25*  CALCIUM 9.1  GFRNONAA >60  ANIONGAP 11    Lipids No results for input(s): "CHOL", "TRIG", "HDL", "LABVLDL", "LDLCALC", "CHOLHDL" in the last 168 hours.  Hematology Recent Labs  Lab 03/04/23 0107 03/05/23 0110 03/06/23 0045  WBC 9.3 8.0 10.7*  RBC 4.73 4.70 4.79  HGB 12.4* 12.0* 12.7*  HCT 39.3 38.6* 40.6  MCV 83.1 82.1 84.8  MCH 26.2 25.5* 26.5  MCHC 31.6 31.1 31.3  RDW 14.6 14.5 14.6  PLT 217 206 225   Thyroid No results for input(s): "TSH", "FREET4" in the last 168 hours.  BNPNo results for input(s): "BNP", "PROBNP" in the last 168 hours.  DDimer No  results for input(s): "DDIMER" in the last 168 hours.   Radiology    ECHOCARDIOGRAM COMPLETE  Result Date: 03/04/2023    ECHOCARDIOGRAM REPORT   Patient Name:   ROXY FILLER Date of Exam: 03/04/2023 Medical Rec #:  [id number]       Height:       69.0 in Accession #:    [id number]      Weight:       176.4 lb Date of Birth:  June 29, 1951       BSA:          1.959 m Patient Age:    72 years        BP:           136/64 mmHg Patient Gender: M               HR:           79 bpm. Exam Location:  Inpatient Procedure: 2D Echo, Cardiac Doppler and Color Doppler Indications:    CHF  History:        Patient has prior history of Echocardiogram examinations, most                 recent 11/30/2022. CAD, Arrythmias:Atrial Fibrillation; Risk                 Factors:Hypertension and Diabetes.  Sonographer:    Darlys Gales Referring Phys: [phone] LINDSAY B ROBERTS IMPRESSIONS  1. Global mild LV  hypokinesis with focal severe apical hypokinesis. Left ventricular ejection fraction, by estimation, is 45 to 50%. The left ventricle has mildly decreased function. The left ventricle demonstrates global hypokinesis. The left ventricular internal cavity size was moderately dilated. Left ventricular diastolic function could not be evaluated.  2. Right ventricular systolic function is mildly reduced. The right ventricular size is moderately enlarged. There is mildly elevated pulmonary artery systolic pressure.  3. Left atrial size was mildly dilated.  4. Right atrial size was mildly dilated.  5. The mitral valve is normal in structure. Trivial mitral valve regurgitation. No evidence of mitral stenosis.  6. The aortic valve is grossly normal. There is mild calcification of the aortic valve. There is mild thickening of the aortic valve. Aortic valve regurgitation is not visualized. No aortic stenosis is present.  7. The inferior vena cava is normal in size with <50% respiratory variability, suggesting right atrial pressure of 8 mmHg. Comparison(s): No significant change from prior study. FINDINGS  Left Ventricle: Global mild LV hypokinesis with focal severe apical hypokinesis. Left ventricular ejection fraction, by estimation, is 45 to 50%. The left ventricle has mildly decreased function. The left ventricle demonstrates global hypokinesis. The left ventricular internal cavity size was moderately dilated. There is no left ventricular hypertrophy. Left ventricular diastolic function could not be evaluated due to atrial fibrillation. Left ventricular diastolic function could not be evaluated. Right Ventricle: The right ventricular size is moderately enlarged. Right vetricular wall thickness was not well visualized. Right ventricular systolic function is mildly reduced. There is mildly elevated pulmonary artery systolic pressure. The tricuspid  regurgitant velocity is 2.78 m/s, and with an assumed right atrial pressure of  8 mmHg, the estimated right ventricular systolic pressure is 38.9 mmHg. Left Atrium: Left atrial size was mildly dilated. Right Atrium: Right atrial size was mildly dilated. Pericardium: There is no evidence of pericardial effusion. Mitral Valve: The mitral valve is normal in structure. Trivial mitral valve regurgitation. No evidence of mitral valve stenosis. Tricuspid Valve: The tricuspid valve  is normal in structure. Tricuspid valve regurgitation is trivial. No evidence of tricuspid stenosis. Aortic Valve: The aortic valve is grossly normal. There is mild calcification of the aortic valve. There is mild thickening of the aortic valve. Aortic valve regurgitation is not visualized. No aortic stenosis is present. Aortic valve mean gradient measures 4.0 mmHg. Aortic valve peak gradient measures 6.5 mmHg. Aortic valve area, by VTI measures 2.58 cm. Pulmonic Valve: The pulmonic valve was not well visualized. Pulmonic valve regurgitation is not visualized. No evidence of pulmonic stenosis. Aorta: The aortic root, ascending aorta and aortic arch are all structurally normal, with no evidence of dilitation or obstruction. Venous: The inferior vena cava is normal in size with less than 50% respiratory variability, suggesting right atrial pressure of 8 mmHg. IAS/Shunts: The interatrial septum was not well visualized.  LEFT VENTRICLE PLAX 2D LVIDd:         6.05 cm   Diastology LVIDs:         4.70 cm   LV e' medial:    4.03 cm/s LV PW:         1.00 cm   LV E/e' medial:  28.5 LV IVS:        0.85 cm   LV e' lateral:   9.14 cm/s LVOT diam:     2.00 cm   LV E/e' lateral: 12.6 LV SV:         59 LV SV Index:   30 LVOT Area:     3.14 cm  RIGHT VENTRICLE             IVC RV S prime:     10.20 cm/s  IVC diam: 1.80 cm TAPSE (M-mode): 1.4 cm LEFT ATRIUM             Index        RIGHT ATRIUM           Index LA Vol (A2C):   74.3 ml 37.93 ml/m  RA Area:     18.00 cm LA Vol (A4C):   42.7 ml 21.80 ml/m  RA Volume:   42.10 ml  21.49 ml/m  LA Biplane Vol: 55.9 ml 28.54 ml/m  AORTIC VALVE AV Area (Vmax):    2.70 cm AV Area (Vmean):   2.59 cm AV Area (VTI):     2.58 cm AV Vmax:           127.00 cm/s AV Vmean:          88.900 cm/s AV VTI:            0.228 m AV Peak Grad:      6.5 mmHg AV Mean Grad:      4.0 mmHg LVOT Vmax:         109.00 cm/s LVOT Vmean:        73.200 cm/s LVOT VTI:          0.187 m LVOT/AV VTI ratio: 0.82  AORTA Ao Root diam: 3.10 cm Ao Asc diam:  3.40 cm MITRAL VALVE                TRICUSPID VALVE MV Area (PHT): 4.46 cm     TR Peak grad:   30.9 mmHg MV Decel Time: 170 msec     TR Vmax:        278.00 cm/s MV E velocity: 115.00 cm/s  SHUNTS                             Systemic VTI:  0.19 m                             Systemic Diam: 2.00 cm Jodelle Red MD Electronically signed by Jodelle Red MD Signature Date/Time: 03/04/2023/3:12:09 PM    Final     Cardiac Studies   Echocardiogram 03/04/23 1. Global mild LV hypokinesis with focal severe apical hypokinesis. Left  ventricular ejection fraction, by estimation, is 45 to 50%. The left  ventricle has mildly decreased function. The left ventricle demonstrates  global hypokinesis. The left  ventricular internal cavity size was moderately dilated. Left ventricular  diastolic function could not be evaluated.   2. Right ventricular systolic function is mildly reduced. The right  ventricular size is moderately enlarged. There is mildly elevated  pulmonary artery systolic pressure.   3. Left atrial size was mildly dilated.   4. Right atrial size was mildly dilated.   5. The mitral valve is normal in structure. Trivial mitral valve  regurgitation. No evidence of mitral stenosis.   6. The aortic valve is grossly normal. There is mild calcification of the  aortic valve. There is mild thickening of the aortic valve. Aortic valve  regurgitation is not visualized. No aortic stenosis is present.   7. The inferior vena cava is normal in  size with <50% respiratory  variability, suggesting right atrial pressure of 8 mmHg.   Comparison(s): No significant change from prior study.   Patient Profile     72 y.o. male with paoxysmal atrial fibrillation, CAD s/p PCI in 2001, s/p 5vCABG (LIMA-LAD, sequential SVG to RI and OM1, sequential SVG to PDA and PL in 2003), chronic diastolic heart fialure, mild MR, mild AS, HTN, HLD, PAD, CKD, DM, and OSA who is being seen for the evaluation of NSTEMI/afib.   Assessment & Plan    NSTEMI  CAD  - Patient previously had PCI in 2001. Later had CABGx3 in 2003 with LIMA-LAD, sequential SVG-RI and OM1, sequential SVG-PDA and PL  - Patient presented to [facility] complaining of shortness of breath. He was given metoprolol and he converted to NSR. hsTn 69>> 426>> 3376. He was started on IV heparin and transferred to [facility] for further evaluation  - Scheduled for LHC today  - Continue ASA, crestor  - Not on BB due to history of bradycardia/morbitz type 1  - Further medication adjustments pending cath   Atrial flutter with RVR  - Patient was found to be in atrial flutter with RVR, HR 120s.  - Patient was treated with IV metoprolol in the [facility] ED with conversion to NSR with PVCs. Per telemetry, patient is predominantly in NSR with brief episodes of aflutter  - He had been compliant with his eliquis prior to admission. Eliquis currently held for cath. On IV heparin pending cath  - Plan to transition back to DOAC after cath   Chronic HfmrEF - Echocardiogram this admission with EF 45-50%, mild LV hypokinesis with focal severe apical hypokinesis, mildly reduced RV systolic function  - Cath today as above - Continue farxiga 10 mg daily  - Creatinine was 1.25 on 6/22. Repeat BMP pending this AM. After cath, can consider adding ARB/ARNI if renal function tolerates  - Patient is euvolemic on exam   HLD  - Lipid  panel from 11/2022 showed LDL 84 - Patient is on crestor 10 mg every Monday and  Friday- he previously was on a different statin and had myalgias, so he has been on low dose crestor 2 days per week. Tolerating it well. Discussed increasing weekly dosing to eventually get him on daily crestor. He is agreeable.   Tobacco use  - Patient admits to chewing tobacco. Discussed cessation      For questions or updates, please contact  [facility] Please consult [email or web address] for contact info under        Signed, Jonita Albee, PA-C  03/06/2023, 9:04 AM

## 2023-03-06 NOTE — Progress Notes (Signed)
ANTICOAGULATION CONSULT NOTE - Initial Consult  Pharmacy Consult for Heparin Indication: chest pain/ACS  No Known Allergies  Patient Measurements: Height: 5\' 9"  (175.3 cm) Weight: 81.7 kg (180 lb 1.6 oz) IBW/kg (Calculated) : 70.7 Heparin Dosing Weight: 84 kg  Vital Signs: Temp: 98.5 F (36.9 C) (06/24 0424) Temp Source: Oral (06/24 0424) BP: 121/58 (06/24 0424) Pulse Rate: 48 (06/24 0424)  Labs: Recent Labs    03/04/23 0107 03/04/23 1041 03/04/23 1943 03/05/23 0110 03/06/23 0045  HGB 12.4*  --   --  12.0* 12.7*  HCT 39.3  --   --  38.6* 40.6  PLT 217  --   --  206 225  APTT 45*   < > 89* 92* 64*  HEPARINUNFRC  --   --   --  0.63 0.43  CREATININE 1.25*  --   --   --   --    < > = values in this interval not displayed.    Estimated Creatinine Clearance: 54.2 mL/min (A) (by C-G formula based on SCr of 1.25 mg/dL (H)).   Medical History: Past Medical History:  Diagnosis Date   A-fib [facility])    CAD (coronary artery disease)    Hypertension    Obstructive sleep apnea    CPAP   Rosacea    Type 2 diabetes mellitus with hyperglycemia (HCC)    Assessment: 75 YOM admitted for NSTEMI - cardiology planning LHC on 6/24. Pharmacy consulted to dose heparin. Takes Eliquis prior to admission - last dose 6/20 PM.  HL 0.43- therapeutic  CBC stable   Goal of Therapy:  Heparin level 0.3-0.7 units/ml aPTT 66-102 seconds Monitor platelets by anticoagulation protocol: Yes   Plan:  Continue heparin drip at 1500 units/hr Daily heparin level and CBC Monitor for signs/symptoms of bleeding F/u LHC   Calton Dach, PharmD Clinical Pharmacist 03/06/2023 7:15 AM

## 2023-03-07 ENCOUNTER — Other Ambulatory Visit: Payer: Self-pay | Admitting: Cardiology

## 2023-03-07 ENCOUNTER — Other Ambulatory Visit (HOSPITAL_COMMUNITY): Payer: Self-pay

## 2023-03-07 ENCOUNTER — Encounter (HOSPITAL_COMMUNITY): Payer: Self-pay | Admitting: Cardiology

## 2023-03-07 DIAGNOSIS — Z720 Tobacco use: Principal | ICD-10-CM | POA: Insufficient documentation

## 2023-03-07 DIAGNOSIS — I471 Supraventricular tachycardia, unspecified: Secondary | ICD-10-CM | POA: Insufficient documentation

## 2023-03-07 DIAGNOSIS — I5022 Chronic systolic (congestive) heart failure: Secondary | ICD-10-CM | POA: Diagnosis not present

## 2023-03-07 DIAGNOSIS — I214 Non-ST elevation (NSTEMI) myocardial infarction: Secondary | ICD-10-CM | POA: Diagnosis not present

## 2023-03-07 DIAGNOSIS — I255 Ischemic cardiomyopathy: Secondary | ICD-10-CM | POA: Insufficient documentation

## 2023-03-07 DIAGNOSIS — Z951 Presence of aortocoronary bypass graft: Secondary | ICD-10-CM

## 2023-03-07 LAB — GLUCOSE, CAPILLARY: Glucose-Capillary: 142 mg/dL — ABNORMAL HIGH (ref 70–99)

## 2023-03-07 LAB — CBC
HCT: 37.8 % — ABNORMAL LOW (ref 39.0–52.0)
Hemoglobin: 11.8 g/dL — ABNORMAL LOW (ref 13.0–17.0)
MCH: 25.9 pg — ABNORMAL LOW (ref 26.0–34.0)
MCHC: 31.2 g/dL (ref 30.0–36.0)
MCV: 83.1 fL (ref 80.0–100.0)
Platelets: 209 10*3/uL (ref 150–400)
RBC: 4.55 MIL/uL (ref 4.22–5.81)
RDW: 14.8 % (ref 11.5–15.5)
WBC: 8.3 10*3/uL (ref 4.0–10.5)
nRBC: 0 % (ref 0.0–0.2)

## 2023-03-07 MED ORDER — SACUBITRIL-VALSARTAN 24-26 MG PO TABS: 1.0000 | ORAL_TABLET | Freq: Two times a day (BID) | ORAL | Status: DC

## 2023-03-07 MED ORDER — SACUBITRIL-VALSARTAN 24-26 MG PO TABS
1.0000 | ORAL_TABLET | Freq: Two times a day (BID) | ORAL | 11 refills | Status: DC
  Filled 2023-03-07: qty 60, 30d supply, fill #0

## 2023-03-07 MED ORDER — ROSUVASTATIN CALCIUM 10 MG PO TABS
10.0000 mg | ORAL_TABLET | Freq: Every day | ORAL | 3 refills | Status: AC
  Filled 2023-03-07: qty 90, 90d supply, fill #0

## 2023-03-07 MED ORDER — NITROGLYCERIN 0.4 MG SL SUBL
0.4000 mg | SUBLINGUAL_TABLET | SUBLINGUAL | 1 refills | Status: DC | PRN
  Filled 2023-03-07: qty 25, 1d supply, fill #0

## 2023-03-07 MED ORDER — METOPROLOL TARTRATE 25 MG PO TABS
12.5000 mg | ORAL_TABLET | Freq: Two times a day (BID) | ORAL | 3 refills | Status: DC
  Filled 2023-03-07: qty 90, 90d supply, fill #0

## 2023-03-07 MED ORDER — METFORMIN HCL ER 500 MG PO TB24: 500.0000 mg | ORAL_TABLET | Freq: Every day | ORAL | Status: DC

## 2023-03-07 MED ORDER — METOPROLOL TARTRATE 12.5 MG HALF TABLET: 12.5000 mg | ORAL_TABLET | Freq: Two times a day (BID) | ORAL | Status: DC

## 2023-03-07 NOTE — Plan of Care (Signed)
Problem: Education: Goal: Knowledge of General Education information will improve Description: Including pain rating scale, medication(s)/side effects and non-pharmacologic comfort measures Outcome: Adequate for Discharge   Problem: Health Behavior/Discharge Planning: Goal: Ability to manage health-related needs will improve Outcome: Adequate for Discharge   Problem: Clinical Measurements: Goal: Ability to maintain clinical measurements within normal limits will improve Outcome: Adequate for Discharge Goal: Will remain free from infection Outcome: Adequate for Discharge Goal: Diagnostic test results will improve Outcome: Adequate for Discharge Goal: Respiratory complications will improve Outcome: Adequate for Discharge Goal: Cardiovascular complication will be avoided Outcome: Adequate for Discharge   Problem: Activity: Goal: Risk for activity intolerance will decrease Outcome: Adequate for Discharge   Problem: Nutrition: Goal: Adequate nutrition will be maintained Outcome: Adequate for Discharge   Problem: Coping: Goal: Level of anxiety will decrease Outcome: Adequate for Discharge   Problem: Elimination: Goal: Will not experience complications related to bowel motility Outcome: Adequate for Discharge Goal: Will not experience complications related to urinary retention Outcome: Adequate for Discharge   Problem: Pain Managment: Goal: General experience of comfort will improve Outcome: Adequate for Discharge   Problem: Safety: Goal: Ability to remain free from injury will improve Outcome: Adequate for Discharge   Problem: Skin Integrity: Goal: Risk for impaired skin integrity will decrease Outcome: Adequate for Discharge   Problem: Education: Goal: Ability to describe self-care measures that may prevent or decrease complications (Diabetes Survival Skills Education) will improve Outcome: Adequate for Discharge Goal: Individualized Educational Video(s) Outcome:  Adequate for Discharge   Problem: Coping: Goal: Ability to adjust to condition or change in health will improve Outcome: Adequate for Discharge   Problem: Fluid Volume: Goal: Ability to maintain a balanced intake and output will improve Outcome: Adequate for Discharge   Problem: Health Behavior/Discharge Planning: Goal: Ability to identify and utilize available resources and services will improve Outcome: Adequate for Discharge Goal: Ability to manage health-related needs will improve Outcome: Adequate for Discharge   Problem: Metabolic: Goal: Ability to maintain appropriate glucose levels will improve Outcome: Adequate for Discharge   Problem: Nutritional: Goal: Maintenance of adequate nutrition will improve Outcome: Adequate for Discharge Goal: Progress toward achieving an optimal weight will improve Outcome: Adequate for Discharge   Problem: Skin Integrity: Goal: Risk for impaired skin integrity will decrease Outcome: Adequate for Discharge   Problem: Tissue Perfusion: Goal: Adequacy of tissue perfusion will improve Outcome: Adequate for Discharge   Problem: Education: Goal: Understanding of cardiac disease, CV risk reduction, and recovery process will improve Outcome: Adequate for Discharge Goal: Individualized Educational Video(s) Outcome: Adequate for Discharge   Problem: Activity: Goal: Ability to tolerate increased activity will improve Outcome: Adequate for Discharge   Problem: Cardiac: Goal: Ability to achieve and maintain adequate cardiovascular perfusion will improve Outcome: Adequate for Discharge   Problem: Health Behavior/Discharge Planning: Goal: Ability to safely manage health-related needs after discharge will improve Outcome: Adequate for Discharge   Problem: Education: Goal: Understanding of CV disease, CV risk reduction, and recovery process will improve Outcome: Adequate for Discharge Goal: Individualized Educational Video(s) Outcome:  Adequate for Discharge   Problem: Activity: Goal: Ability to return to baseline activity level will improve Outcome: Adequate for Discharge   Problem: Cardiovascular: Goal: Ability to achieve and maintain adequate cardiovascular perfusion will improve Outcome: Adequate for Discharge Goal: Vascular access site(s) Level 0-1 will be maintained Outcome: Adequate for Discharge   Problem: Health Behavior/Discharge Planning: Goal: Ability to safely manage health-related needs after discharge will improve Outcome: Adequate for Discharge

## 2023-03-07 NOTE — TOC Benefit Eligibility Note (Signed)
Pharmacy Patient Advocate Encounter  Insurance verification completed.    The patient is insured through [facility] Medicare Part D  Ran test claim for Entresto 24-26 mg and the current 30 day co-pay is $161.61 due to being in Coverage Gap (donut hole).   This test claim was processed through [facility]- copay amounts may vary at other pharmacies due to pharmacy/plan contracts, or as the patient moves through the different stages of their insurance plan.    Roland Earl, CPHT Pharmacy Patient Advocate Specialist [facility] Patient Advocate Team Direct Number: [phone]  Fax: [phone]

## 2023-03-07 NOTE — TOC Initial Note (Signed)
Transition of Care [facility]) - Initial/Assessment Note    Patient Details  Name: [name] MRN: [id number] Date of Birth: [birth year]  Transition of Care [facility]) CM/SW Contact:    Leone Haven, RN Phone Number: 03/07/2023, 10:45 AM  Clinical Narrative:                 From home with wife, indep, he has PCP and insurance on file, he currently does not have any HH services in place. He has a cane and a walker at home with a bp monitor.  Wife is at the bedside and will transport him home. She is also his support system , TOC pharmacy will fill his medications.   Expected Discharge Plan: Home/Self Care Barriers to Discharge: No Barriers Identified   Patient Goals and CMS Choice Patient states their goals for this hospitalization and ongoing recovery are:: return home   Choice offered to / list presented to : NA      Expected Discharge Plan and Services In-house Referral: NA Discharge Planning Services: CM Consult Post Acute Care Choice: NA Living arrangements for the past 2 months: Single Family Home Expected Discharge Date: 03/07/23               DME Arranged: N/A DME Agency: NA       HH Arranged: NA          Prior Living Arrangements/Services Living arrangements for the past 2 months: Single Family Home Lives with:: Spouse Patient language and need for interpreter reviewed:: Yes Do you feel safe going back to the place where you live?: Yes      Need for Family Participation in Patient Care: No (Comment) Care giver support system in place?: Yes (comment) Current home services: DME (cane, cpap machine , bp montior) Criminal Activity/Legal Involvement Pertinent to Current Situation/Hospitalization: No - Comment as needed  Activities of Daily Living Home Assistive Devices/Equipment: Cane (specify quad or straight) ADL Screening (condition at time of admission) Patient's cognitive ability adequate to safely complete daily activities?: Yes Is the patient deaf  or have difficulty hearing?: No Does the patient have difficulty seeing, even when wearing glasses/contacts?: No Does the patient have difficulty concentrating, remembering, or making decisions?: No Patient able to express need for assistance with ADLs?: Yes Does the patient have difficulty dressing or bathing?: No Independently performs ADLs?: Yes (appropriate for developmental age) Does the patient have difficulty walking or climbing stairs?: No Weakness of Legs: None Weakness of Arms/Hands: None  Permission Sought/Granted                  Emotional Assessment Appearance:: Appears stated age Attitude/Demeanor/Rapport: Engaged Affect (typically observed): Appropriate Orientation: : Oriented to Self, Oriented to Place, Oriented to  Time, Oriented to Situation Alcohol / Substance Use: Not Applicable Psych Involvement: No (comment)  Admission diagnosis:  NSTEMI (non-ST elevated myocardial infarction) (HCC) [I21.4] Patient Active Problem List   Diagnosis Date Noted   S/P CABG (coronary artery bypass graft) 03/07/2023   SVT (supraventricular tachycardia) 03/07/2023   Ischemic cardiomyopathy 03/07/2023   Chewing tobacco use 03/07/2023   NSTEMI (non-ST elevated myocardial infarction) (HCC) 03/03/2023   Acute hypoxic respiratory failure (HCC) 12/02/2022   Acute on chronic heart failure with preserved ejection fraction (HFpEF) (HCC) 12/01/2022   PAF (paroxysmal atrial fibrillation) (HCC) 12/01/2022   Paroxysmal atrial flutter (HCC) 12/01/2022   Hyponatremia 12/01/2022   Chronic kidney disease, stage 2, mildly decreased GFR 12/01/2022   Aortic stenosis 12/01/2022   Mitral regurgitation  12/01/2022   PAD (peripheral artery disease) (HCC) 12/01/2022   Left renal artery stenosis (HCC) 12/01/2022   Pulmonary edema 11/30/2022   Diverticulosis 06/20/2022   Tubular adenoma of colon 03/23/2022   Dyslipidemia 09/14/2021   Alteration in anticoagulation    Syncope and collapse     Pulmonary hypertension, unspecified (HCC)    Coronary artery disease involving native coronary artery of native heart without angina pectoris    Anticoagulated on warfarin    Elevated troponin 01/19/2021   Postural dizziness with presyncope 01/19/2021   Mixed diabetic hyperlipidemia associated with type 2 diabetes mellitus (HCC) 01/19/2021   Type 2 diabetes mellitus with hyperglycemia, without long-term current use of insulin (HCC) 01/19/2021   Chronic atrial fibrillation (HCC) 01/19/2021   Essential hypertension 01/19/2021   Rosacea 05/26/2016   Obstructive sleep apnea 04/30/2014   PCP:  Donetta Potts, MD Pharmacy:   [facility] [phone] - [place], [place] - [address] AT [facility] [address] [place] [address] Phone: [phone] Fax: [phone]     Social Determinants of Health (SDOH) Social History: SDOH Screenings   Food Insecurity: No Food Insecurity (03/03/2023)  Housing: Low Risk  (03/03/2023)  Transportation Needs: No Transportation Needs (03/03/2023)  Utilities: Not At Risk (03/03/2023)  Tobacco Use: High Risk (03/07/2023)   SDOH Interventions:     Readmission Risk Interventions    12/02/2022    1:23 PM  Readmission Risk Prevention Plan  Post Dischage Appt Complete  Medication Screening Complete  Transportation Screening Complete

## 2023-03-07 NOTE — Discharge Summary (Addendum)
Discharge Summary    Patient ID: [name] MRN: [id number]; DOB: [birth year]  Admit date: 03/03/2023 Discharge date: 03/07/2023  PCP:  Donetta Potts, MD   [facility] Providers Cardiologist:  Rollene Rotunda, MD     Discharge Diagnoses    Principal Problem:   NSTEMI (non-ST elevated myocardial infarction) [facility]) Active Problems:   Dyslipidemia   Paroxysmal atrial flutter (HCC)   S/P CABG (coronary artery bypass graft)   SVT (supraventricular tachycardia)   Ischemic cardiomyopathy   Chewing tobacco use    Diagnostic Studies/Procedures    LHC 03/06/23   --------------NATIVE VESSELS--------------   Prox RCA to Mid RCA lesion is 60% stenosed. Mid RCA to Dist RCA lesion is 90% stenosed. Dist RCA lesion is 100% stenosed with 100% stenosed side branch in RPAV. 2nd RPL lesion is 100% stenosed. RPDA lesion is 100% stenosed. - STABLE   Prox LAD to Mid LAD is 100% stenosed at proximal edge of prior stenT / SP1 - STABLE   Previously grafted ramus lesion is 100% stenosed. STABLE   2nd Mrg SUPERIOR branch is 80% stenosed. (Prieviously grafted)   --------------GRAFTS-------------------   Seq SVG- -RI-OM1 graft was visualized by angiography.  The graft exhibits severe diffuse disease. Origin to Prox Graft lesion is 100% stenosed.   Seq SVG- RPDA-RPL graft was visualized by angiography and is normal in caliber.  Prox Graft lesion before RPDA is 40% stenosed.   LIMA-LAD(Diag) graft was visualized by angiography and is normal in caliber, fills Diag branch only.  Mid LAD to Dist LAD lesion is 100% stenosed.- beyond LIMA-LAD insertion   ------------HEMODYNAMICS------------------   LV end diastolic pressure is mildly elevated.   There is no aortic valve stenosis.   -----------------RECOMMENDATIONS--------------------   Recommend to resume Apixaban, at currently prescribed dose and frequency on 03/07/2023.   Recommend concurrent antiplatelet therapy of Aspirin 81 mg daily for  LONG-TERM.   Severe Native CAD with 3/5 grafts patent (except LIMA only fills a Diag branch & not the distal LAD that is atretic & occluded). Native Vessels LAD 100% CTO af 1st Sept (prox edge of stent) RI 100 % CTO, superior branch of OM1 99%, native Cx - mild diffuse disease Diffuse mid-distal RCA ~70-80% irregular diseae before 100% CTO in distal vessel prior to bifurcation.  Grafts: Known occlusion of SeqSVG-RI-OM1 Patent LIMA-LAD/Diag with atretic/100% LAD Patent Seq SVG-RPDA-RPL1 (wiht very small RPDA & moderate RPL & no retrograde flow to bifurcation LVEDP 18 mmHg  No acute lesion found     RECOMMENDATIONS Continue GDMT for ICM/CHF with A-fib.  Suspected troponin elevation is related to demand ischemia. Restart Eliquis in the morning, will start heparin 8 hours after Mynx closure.   Results communicated to Dr. Rennis Golden Diagnostic Dominance: Right   Echocardiogram 03/04/23  1. Global mild LV hypokinesis with focal severe apical hypokinesis. Left  ventricular ejection fraction, by estimation, is 45 to 50%. The left  ventricle has mildly decreased function. The left ventricle demonstrates  global hypokinesis. The left  ventricular internal cavity size was moderately dilated. Left ventricular  diastolic function could not be evaluated.   2. Right ventricular systolic function is mildly reduced. The right  ventricular size is moderately enlarged. There is mildly elevated  pulmonary artery systolic pressure.   3. Left atrial size was mildly dilated.   4. Right atrial size was mildly dilated.   5. The mitral valve is normal in structure. Trivial mitral valve  regurgitation. No evidence of mitral stenosis.   6.  The aortic valve is grossly normal. There is mild calcification of the  aortic valve. There is mild thickening of the aortic valve. Aortic valve  regurgitation is not visualized. No aortic stenosis is present.   7. The inferior vena cava is normal in size with <50%  respiratory  variability, suggesting right atrial pressure of 8 mmHg.   Comparison(s): No significant change from prior study.  _____________   History of Present Illness     [name] is a 72 y.o. male with a past medical history of  paoxysmal atrial fibrillation, CAD s/p PCI in 2001, s/p 5vCABG (LIMA-diagonal, sequential SVG to RI and OM1, sequential SVG to PDA and PL in 2003), chronic diastolic heart failure, mild MR, mild AS, HTN, HLD, PAD, CKD, DM, and OSA.   He has been followed by Dr. Antoine Poche as an outpatient, as well as Dr. Kirke Corin for peripheral vascular disease.  Previously admitted 2022 for syncope and underwent cardiac catheterization and found to have 3/5 patent grafts. Wore a cardiac monitor showing atrial fibrillation with rates as low as 30s predominantly during sleeping.     He was admitted 11/2022 after being set up for an outpatient PV angiogram but arrived fluid overloaded requiring IV Lasix.  During that admission echocardiogram showed LVEF of 50 to 55%, positive wall motion abnormality similar to prior territory, moderate biatrial enlargement, mild MR, mild AS.  In that admission he was found to have intermittent atrial fibrillation but fairly regularly controlled.  He was resumed on Eliquis.  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  Renal artery duplex showed 1-59% left renal artery stenosis with normal size of left kidney, patent left renal artery with mild elevated velocities without significant stenosis, right side had abnormal right resistive index but no significant RAS.    Presented to [facility] for SOB. When he arrived, he was found to be in Afib to the 110s, given metoprolol and then he cardioverted to NSR. No CP. BNP elevated to 589, treated with IV Lasix 20 mg x 1.  Chest x-ray with increased vascular congestion, mild interstitial edema.  EKG 6/20 2336--> reads as possible ectopic atrial tachycardia  though suspect atrial flutter, 2-1, 120 bpm   Hs-trop 69>> 426>> 3376.  He was given aspirin and started on IV heparin.  Transferred to [facility] for further evaluation.     On arrival to [facility] [place], patient remained on 2 L nasal cannula. He denied any chest pain  and reported that he felt much better than initial presentation.  His last dose of Eliquis was evening of 6/20.  Hospital Course     Consultants: None   Patient presented to [facility] ED complaining of shortness of breath. He was found to be in atrial flutter with HR in the 110s. He was treated with IV metoprolol and converted to NSR. BNP was elevated to 589 and he was given one dose of IV lasix 20 mg. High sensitivity troponin 69>426> 3376. He was given ASA and started on IV heparin, and he was transferred to [facility] for further evaluation. At [facility], he was chest pain free. He underwent echocardiogram on 03/04/23 that showed EF 45-50%, with apical hypokinesis, mildly reduced RV systolic function. He was taken to the cath lab on 6/24 and was found to have severe native CAD with 3/5 grafts patent (known occlusion of Seq SVG-RI-OM1. Patent LIMA-LAD/diagonal with atretic/100% LAD, patent Seq SVG-RPDA-RPL1). Of note, LIMA only fills  a diagonal branch and not the distal LAD. LAD is atretic and occluded. There were no acute lesions found. After cardiac catheterization, patient was started on entresto24-26 mg BID for ischemic cardiomyopathy. IV heparin was stopped and eliquis was resumed on 6/25.   NSTEMI  CAD s/p CABG  - Patient previously had PCI in 2001. Later had CABGx3 in 2003 with LIMA-LAD/diagonal, sequential SVG-RI-OM1, sequential SVG-PDA-RPL1  - Patient presented to [facility] complaining of shortness of breath. He was given metoprolol and he converted to NSR. hsTn 69>> 426>> 3376. He was started on IV heparin and transferred to [facility] for further evaluation  - LHC on 6/24 showed severe native vessel CAD with 3/5 grafts patent.  Of note, LIMA was patent, but only filled a diagonal branch and not the distal LAD as the distal LAD is atretic and occluded. There were no acute lesions noted on cath, and it was recommended that patient be managed medically  - Continue ASA  - Increase crestor to 10 mg daily  - Start metoprolol tartrate 12.5 mg BID - of note, patient did have a tachyarrhythmia concerning for SVT this admission. HR responded to vagal maneuvers. Discussed with patient the importance of monitoring his HR at home with pulse ox, smart watches, BP cuff, etc  - Discussed femoral site care and provided written instructions on DC paperwork   Paroxysmal Atrial Flutter  SVT  - Patient has a known history of atrial flutter. Presented to [facility] ED in atrial flutter with HR in the 120s. He was treated with IV metoprolol and converted to NSR  - While at [facility], patient had an episode of tachycardia that responded to vagal maneuvers. Concerning for SVT. He was asymptomatic during the episode.  - Started metoprolol tartrate 12.5 mg BID - Discussed importance of HR monitoring and vagal maneuvers  - Continue eliquis 5 mg BID  - Asked patient to keep a HR/BP log and to bring to follow up appointment   Ischemic Cardiomyopathy  - Echocardiogram this admission showed EF 45-50%, mild LV hypokinesis with focal severe apical hypokinesis, mildly reduced RV systolic function  - Cath showed that LIMA fills diagonal branch only and that the distal LAD is atretic/occluded. This explains apical hypokinesis on echo  - Continue farxiga 10 mg daily  - Started entresto 24-26 mg BID and metoprolol tartrate 12.5 mg BID this admission  - Patient appears euvolemic on exam prior to DC. OK to continue PRN lasix 20 mg  - Ordered BMP to be drawn next- instructed patient to go to either [facility] or the [facility] in [place] to have drawn   HLD  - Lipid panel from 11/2022 showed LDL 84  - Patient has been taking crestor 40 mg every Monday and Friday. He  previously had myalgias on a different statin. I instructed patient to gradually increase his weekly doses of crestor until he is taking every day   Tobacco Use  - Patient admits to chewing tobacco. Counseled on cessation  Type 2 DM  - Resume home medications at DC - Resume metformin on 6/26  Patient was seen and examined by Dr. Rennis Golden and was deemed stable for discharge   Patient has a follow up appointment in our [place] office on 7/11    Did the patient have an acute coronary syndrome (MI, NSTEMI, STEMI, etc) this admission?:  No.   The elevated Troponin was due to the acute medical illness (demand ischemia).   _____________  Discharge Vitals Blood pressure (!) 148/58, pulse  81, temperature 98.2 F (36.8 C), temperature source Oral, resp. rate 18, height 5\' 9"  (1.753 m), weight 81 kg, SpO2 92 %.  Filed Weights   03/05/23 0322 03/06/23 0424 03/07/23 0440  Weight: 80.8 kg 81.7 kg 81 kg    Labs & Radiologic Studies    CBC Recent Labs    03/06/23 0045 03/07/23 0029  WBC 10.7* 8.3  HGB 12.7* 11.8*  HCT 40.6 37.8*  MCV 84.8 83.1  PLT 225 209   Basic Metabolic Panel Recent Labs    21/30/86 0045 03/06/23 1318  NA 135 134*  K 4.0 3.6  CL 100 104  CO2 21* 20*  GLUCOSE 188* 109*  BUN 29* 22  CREATININE 1.57* 1.12  CALCIUM 9.2 8.8*   Liver Function Tests No results for input(s): "AST", "ALT", "ALKPHOS", "BILITOT", "PROT", "ALBUMIN" in the last 72 hours. No results for input(s): "LIPASE", "AMYLASE" in the last 72 hours. High Sensitivity Troponin:   No results for input(s): "TROPONINIHS" in the last 720 hours.  BNP Invalid input(s): "POCBNP" D-Dimer No results for input(s): "DDIMER" in the last 72 hours. Hemoglobin A1C No results for input(s): "HGBA1C" in the last 72 hours. Fasting Lipid Panel No results for input(s): "CHOL", "HDL", "LDLCALC", "TRIG", "CHOLHDL", "LDLDIRECT" in the last 72 hours. Thyroid Function Tests No results for input(s): "TSH", "T4TOTAL",  "T3FREE", "THYROIDAB" in the last 72 hours.  Invalid input(s): "FREET3" _____________  CARDIAC CATHETERIZATION  Result Date: 03/06/2023   --------------NATIVE VESSELS--------------   Prox RCA to Mid RCA lesion is 60% stenosed. Mid RCA to Dist RCA lesion is 90% stenosed. Dist RCA lesion is 100% stenosed with 100% stenosed side branch in RPAV. 2nd RPL lesion is 100% stenosed. RPDA lesion is 100% stenosed. - STABLE   Prox LAD to Mid LAD is 100% stenosed at proximal edge of prior stenT / SP1 - STABLE   Previously grafted ramus lesion is 100% stenosed. STABLE   2nd Mrg SUPERIOR branch is 80% stenosed. (Prieviously grafted)   --------------GRAFTS-------------------   Seq SVG- -RI-OM1 graft was visualized by angiography.  The graft exhibits severe diffuse disease. Origin to Prox Graft lesion is 100% stenosed.   Seq SVG- RPDA-RPL graft was visualized by angiography and is normal in caliber.  Prox Graft lesion before RPDA is 40% stenosed.   LIMA-LAD(Diag) graft was visualized by angiography and is normal in caliber, fills Diag branch only.  Mid LAD to Dist LAD lesion is 100% stenosed.- beyond LIMA-LAD insertion   ------------HEMODYNAMICS------------------   LV end diastolic pressure is mildly elevated.   There is no aortic valve stenosis.   -----------------RECOMMENDATIONS--------------------   Recommend to resume Apixaban, at currently prescribed dose and frequency on 03/07/2023.   Recommend concurrent antiplatelet therapy of Aspirin 81 mg daily for LONG-TERM. Severe Native CAD with 3/5 grafts patent (except LIMA only fills a Diag branch & not the distal LAD that is atretic & occluded). Native Vessels LAD 100% CTO af 1st Sept (prox edge of stent) RI 100 % CTO, superior branch of OM1 99%, native Cx - mild diffuse disease Diffuse mid-distal RCA ~70-80% irregular diseae before 100% CTO in distal vessel prior to bifurcation. Grafts: Known occlusion of SeqSVG-RI-OM1 Patent LIMA-LAD/Diag with atretic/100% LAD Patent Seq  SVG-RPDA-RPL1 (wiht very small RPDA & moderate RPL & no retrograde flow to bifurcation LVEDP 18 mmHg No acute lesion found RECOMMENDATIONS Continue GDMT for ICM/CHF with A-fib.  Suspected troponin elevation is related to demand ischemia. Restart Eliquis in the morning, will start heparin 8 hours after Mynx  closure. Results communicated to Dr. Donalee Citrin, MD  ECHOCARDIOGRAM COMPLETE  Result Date: 03/04/2023    ECHOCARDIOGRAM REPORT   Patient Name:   [name] Date of Exam: 03/04/2023 Medical Rec #:  [id number]       Height:       69.0 in Accession #:    [id number]      Weight:       176.4 lb Date of Birth:  03/19/1951       BSA:          1.959 m Patient Age:    72 years        BP:           136/64 mmHg Patient Gender: M               HR:           79 bpm. Exam Location:  Inpatient Procedure: 2D Echo, Cardiac Doppler and Color Doppler Indications:    CHF  History:        Patient has prior history of Echocardiogram examinations, most                 recent 11/30/2022. CAD, Arrythmias:Atrial Fibrillation; Risk                 Factors:Hypertension and Diabetes.  Sonographer:    Darlys Gales Referring Phys: [phone] LINDSAY B ROBERTS IMPRESSIONS  1. Global mild LV hypokinesis with focal severe apical hypokinesis. Left ventricular ejection fraction, by estimation, is 45 to 50%. The left ventricle has mildly decreased function. The left ventricle demonstrates global hypokinesis. The left ventricular internal cavity size was moderately dilated. Left ventricular diastolic function could not be evaluated.  2. Right ventricular systolic function is mildly reduced. The right ventricular size is moderately enlarged. There is mildly elevated pulmonary artery systolic pressure.  3. Left atrial size was mildly dilated.  4. Right atrial size was mildly dilated.  5. The mitral valve is normal in structure. Trivial mitral valve regurgitation. No evidence of mitral stenosis.  6. The aortic valve is grossly normal. There is  mild calcification of the aortic valve. There is mild thickening of the aortic valve. Aortic valve regurgitation is not visualized. No aortic stenosis is present.  7. The inferior vena cava is normal in size with <50% respiratory variability, suggesting right atrial pressure of 8 mmHg. Comparison(s): No significant change from prior study. FINDINGS  Left Ventricle: Global mild LV hypokinesis with focal severe apical hypokinesis. Left ventricular ejection fraction, by estimation, is 45 to 50%. The left ventricle has mildly decreased function. The left ventricle demonstrates global hypokinesis. The left ventricular internal cavity size was moderately dilated. There is no left ventricular hypertrophy. Left ventricular diastolic function could not be evaluated due to atrial fibrillation. Left ventricular diastolic function could not be evaluated. Right Ventricle: The right ventricular size is moderately enlarged. Right vetricular wall thickness was not well visualized. Right ventricular systolic function is mildly reduced. There is mildly elevated pulmonary artery systolic pressure. The tricuspid  regurgitant velocity is 2.78 m/s, and with an assumed right atrial pressure of 8 mmHg, the estimated right ventricular systolic pressure is 38.9 mmHg. Left Atrium: Left atrial size was mildly dilated. Right Atrium: Right atrial size was mildly dilated. Pericardium: There is no evidence of pericardial effusion. Mitral Valve: The mitral valve is normal in structure. Trivial mitral valve regurgitation. No evidence of mitral valve stenosis. Tricuspid Valve: The tricuspid valve is normal in structure. Tricuspid valve  regurgitation is trivial. No evidence of tricuspid stenosis. Aortic Valve: The aortic valve is grossly normal. There is mild calcification of the aortic valve. There is mild thickening of the aortic valve. Aortic valve regurgitation is not visualized. No aortic stenosis is present. Aortic valve mean gradient measures  4.0 mmHg. Aortic valve peak gradient measures 6.5 mmHg. Aortic valve area, by VTI measures 2.58 cm. Pulmonic Valve: The pulmonic valve was not well visualized. Pulmonic valve regurgitation is not visualized. No evidence of pulmonic stenosis. Aorta: The aortic root, ascending aorta and aortic arch are all structurally normal, with no evidence of dilitation or obstruction. Venous: The inferior vena cava is normal in size with less than 50% respiratory variability, suggesting right atrial pressure of 8 mmHg. IAS/Shunts: The interatrial septum was not well visualized.  LEFT VENTRICLE PLAX 2D LVIDd:         6.05 cm   Diastology LVIDs:         4.70 cm   LV e' medial:    4.03 cm/s LV PW:         1.00 cm   LV E/e' medial:  28.5 LV IVS:        0.85 cm   LV e' lateral:   9.14 cm/s LVOT diam:     2.00 cm   LV E/e' lateral: 12.6 LV SV:         59 LV SV Index:   30 LVOT Area:     3.14 cm  RIGHT VENTRICLE             IVC RV S prime:     10.20 cm/s  IVC diam: 1.80 cm TAPSE (M-mode): 1.4 cm LEFT ATRIUM             Index        RIGHT ATRIUM           Index LA Vol (A2C):   74.3 ml 37.93 ml/m  RA Area:     18.00 cm LA Vol (A4C):   42.7 ml 21.80 ml/m  RA Volume:   42.10 ml  21.49 ml/m LA Biplane Vol: 55.9 ml 28.54 ml/m  AORTIC VALVE AV Area (Vmax):    2.70 cm AV Area (Vmean):   2.59 cm AV Area (VTI):     2.58 cm AV Vmax:           127.00 cm/s AV Vmean:          88.900 cm/s AV VTI:            0.228 m AV Peak Grad:      6.5 mmHg AV Mean Grad:      4.0 mmHg LVOT Vmax:         109.00 cm/s LVOT Vmean:        73.200 cm/s LVOT VTI:          0.187 m LVOT/AV VTI ratio: 0.82  AORTA Ao Root diam: 3.10 cm Ao Asc diam:  3.40 cm MITRAL VALVE                TRICUSPID VALVE MV Area (PHT): 4.46 cm     TR Peak grad:   30.9 mmHg MV Decel Time: 170 msec     TR Vmax:        278.00 cm/s MV E velocity: 115.00 cm/s                             SHUNTS  Systemic VTI:  0.19 m                             Systemic Diam: 2.00 cm  Jodelle Red MD Electronically signed by Jodelle Red MD Signature Date/Time: 03/04/2023/3:12:09 PM    Final    Disposition   Pt is being discharged home today in good condition.  Follow-up Plans & Appointments     Follow-up Information     Sharlene Dory, NP Follow up on 03/23/2023.   Specialty: Cardiology Why: Appointment at 9 AM Contact information: [address] [place] [address] [phone]                Discharge Instructions     AMB referral to Phase II Cardiac Rehabilitation   Complete by: As directed    Diagnosis: Heart Failure (see criteria below if ordering Phase II)   Heart Failure Type: Chronic Systolic & Diastolic   After initial evaluation and assessments completed: Virtual Based Care may be provided alone or in conjunction with Phase 2 Cardiac Rehab based on patient barriers.: Yes   Intensive Cardiac Rehabilitation (ICR) [facility] location only OR Traditional Cardiac Rehabilitation (TCR) *If criteria for ICR are not met will enroll in TCR [facility] only): Yes   Call MD for:  persistant dizziness or light-headedness   Complete by: As directed    Call MD for:  redness, tenderness, or signs of infection (pain, swelling, redness, odor or green/yellow discharge around incision site)   Complete by: As directed    Call MD for:  severe uncontrolled pain   Complete by: As directed    Diet - low sodium heart healthy   Complete by: As directed    Increase activity slowly   Complete by: As directed         Discharge Medications   Allergies as of 03/07/2023   No Known Allergies      Medication List     STOP taking these medications    hydrochlorothiazide 12.5 MG tablet Commonly known as: HYDRODIURIL   ondansetron 4 MG tablet Commonly known as: Zofran       TAKE these medications    amLODipine 10 MG tablet Commonly known as: NORVASC Take 10 mg by mouth daily.   aspirin EC 81 MG tablet Take 1 tablet (81 mg total) by mouth  daily. Swallow whole.   dapagliflozin propanediol 10 MG Tabs tablet Commonly known as: FARXIGA Take 1 tablet (10 mg total) by mouth daily.   Eliquis 5 MG Tabs tablet Generic drug: apixaban TAKE 1 TABLET BY MOUTH TWICE A DAY   ergocalciferol 1.25 MG (50000 UT) capsule Commonly known as: VITAMIN D2 Take 50,000 Units by mouth once a week.   furosemide 20 MG tablet Commonly known as: LASIX TAKE 1 TABLET BY MOUTH DAILY AS NEEDED (WEIGHT GAIN OF 3-5LB OR INCREASED SHORTNESS OF BREATH). What changed: See the new instructions.   glipiZIDE 10 MG tablet Commonly known as: GLUCOTROL Take 5 mg by mouth 2 (two) times daily before a meal.   metFORMIN 500 MG 24 hr tablet Commonly known as: GLUCOPHAGE-XR Take 1 tablet (500 mg total) by mouth daily with breakfast. Start taking on: March 08, 2023   metoprolol tartrate 25 MG tablet Commonly known as: LOPRESSOR Take 0.5 tablets (12.5 mg total) by mouth 2 (two) times daily.   nitroGLYCERIN 0.4 MG SL tablet Commonly known as: NITROSTAT Place 1 tablet (0.4 mg total) under  the tongue every 5 (five) minutes x 3 doses as needed for chest pain.   rosuvastatin 10 MG tablet Commonly known as: CRESTOR Take 1 tablet (10 mg total) by mouth daily. What changed:  when to take this additional instructions   sacubitril-valsartan 24-26 MG Commonly known as: ENTRESTO Take 1 tablet by mouth 2 (two) times daily.   Trulicity 4.5 MG/0.5ML Sopn Generic drug: Dulaglutide Inject 4.5 mg into the skin once a week.           Outstanding Labs/Studies     Duration of Discharge Encounter   Greater than 30 minutes including physician time.  Signed, Jonita Albee, PA-C 03/07/2023, 10:11 AM

## 2023-03-07 NOTE — Discharge Instructions (Signed)
Groin Site Care Refer to this sheet in the next few weeks. These instructions provide you with information on caring for yourself after your procedure. Your caregiver may also give you more specific instructions. Your treatment has been planned according to current medical practices, but problems sometimes occur. Call your caregiver if you have any problems or questions after your procedure. HOME CARE INSTRUCTIONS You may shower 24 hours after the procedure. Remove the bandage (dressing) and gently wash the site with plain soap and water. Gently pat the site dry.  Do not apply powder or lotion to the site.  Do not sit in a bathtub, swimming pool, or whirlpool for 5 to 7 days.  No bending, squatting, or lifting anything over 10 pounds (4.5 kg) as directed by your caregiver.  Inspect the site at least twice daily.  Do not drive home if you are discharged the same day of the procedure. Have someone else drive you.  You may drive 24 hours after the procedure unless otherwise instructed by your caregiver.  What to expect: Any bruising will usually fade within 1 to 2 weeks.  Blood that collects in the tissue (hematoma) may be painful to the touch. It should usually decrease in size and tenderness within 1 to 2 weeks.  SEEK IMMEDIATE MEDICAL CARE IF: You have unusual pain at the groin site or down the affected leg.  You have redness, warmth, swelling, or pain at the groin site.  You have drainage (other than a small amount of blood on the dressing).  You have chills.  You have a fever or persistent symptoms for more than 72 hours.  You have a fever and your symptoms suddenly get worse.  Your leg becomes pale, cool, tingly, or numb.  You have heavy bleeding from the site. Hold pressure on the site.   You have been prescribed sublingual nitroglycerin. If you have chest pain, you may place one tablet under your tongue and allow it to dissolve. If pain does not go away after 5 minutes, you may take  another tablet. After an additional 5 minutes, you may take a third dose, but at that time should call 911 and go to the emergency department. Do not take nitroglycerin within 24 hours of taking medications for erectile dysfunction as these medications interact and cause dangerously low blood pressures.   You may resume metformin on 6/24

## 2023-03-07 NOTE — Consult Note (Signed)
   [facility] Inpatient Consult   03/07/2023  [name] [birth year] [id number]  [facility] [[facility]]  Accountable Care Organization [ACO] Patient: Medicare ACO REACH  Primary Care Provider:  Donetta Potts, MD with [facility]   Patient screened for 2 hospitalizations in 6 months with noted low risk score for unplanned readmission risk 3 day length of stay and to assess for potential [facility]  [[facility]] Care Management service needs for post hospital transition for care coordination.  Review of patient's electronic medical record reveals patient is admitted with NSTEMI.  Met with the patient going home, gave a appointment reminder card, patient was generous and accepting  Plan:   Referral request for community care coordination: Referral for [facility] Coordination for post hospital follow up with NSTEMI, DM2, Tobacco Use  Of note, [facility] Care Management/Population Health does not replace or interfere with any arrangements made by the Inpatient Transition of Care team.  For questions contact:   Charlesetta Shanks, RN BSN CCM [facility]  [phone] business mobile phone Toll free office [phone]  *Concierge Line  [phone] Fax number: [phone] [name]

## 2023-03-07 NOTE — TOC Transition Note (Signed)
Transition of Care [facility]) - CM/SW Discharge Note   Patient Details  Name: [name] MRN: [id number] Date of Birth: 1951-07-08  Transition of Care [facility]) CM/SW Contact:  Leone Haven, RN Phone Number: 03/07/2023, 10:49 AM   Clinical Narrative:    For dc today, has no needs. Wife will transport home. TOC to fill meds.   Final next level of care: Home/Self Care Barriers to Discharge: No Barriers Identified   Patient Goals and CMS Choice   Choice offered to / list presented to : NA  Discharge Placement                         Discharge Plan and Services Additional resources added to the After Visit Summary for   In-house Referral: NA Discharge Planning Services: CM Consult Post Acute Care Choice: NA          DME Arranged: N/A DME Agency: NA       HH Arranged: NA          Social Determinants of Health (SDOH) Interventions SDOH Screenings   Food Insecurity: No Food Insecurity (03/03/2023)  Housing: Low Risk  (03/03/2023)  Transportation Needs: No Transportation Needs (03/03/2023)  Utilities: Not At Risk (03/03/2023)  Tobacco Use: High Risk (03/07/2023)     Readmission Risk Interventions    12/02/2022    1:23 PM  Readmission Risk Prevention Plan  Post Dischage Appt Complete  Medication Screening Complete  Transportation Screening Complete

## 2023-03-08 ENCOUNTER — Other Ambulatory Visit (HOSPITAL_COMMUNITY): Payer: Self-pay

## 2023-03-08 ENCOUNTER — Telehealth: Payer: Self-pay | Admitting: *Deleted

## 2023-03-08 LAB — LIPOPROTEIN A (LPA): Lipoprotein (a): 13.5 nmol/L (ref <75.0)

## 2023-03-08 NOTE — Progress Notes (Signed)
  Care Coordination   Note   03/08/2023 Name: [name] MRN: [id number] DOB: 28-Jan-1951  [name] is a 72 y.o. year old male who sees Donetta Potts, MD for primary care. I reached out to [name] by phone today to offer care coordination services.  Mr. Graveline was given information about Care Coordination services today including:   The Care Coordination services include support from the care team which includes your Nurse Coordinator, Clinical Social Worker, or Pharmacist.  The Care Coordination team is here to help remove barriers to the health concerns and goals most important to you. Care Coordination services are voluntary, and the patient may decline or stop services at any time by request to their care team member.   Care Coordination Consent Status: Patient agreed to services and verbal consent obtained.   Follow up plan:  Telephone appointment with care coordination team member scheduled for:  03/20/23  Encounter Outcome:  Pt. Scheduled  [facility] Coordination Care Guide  Direct Dial: [phone]

## 2023-03-17 ENCOUNTER — Encounter: Payer: Medicare Other | Admitting: *Deleted

## 2023-03-20 ENCOUNTER — Ambulatory Visit: Payer: Self-pay | Admitting: *Deleted

## 2023-03-20 ENCOUNTER — Encounter: Payer: Self-pay | Admitting: *Deleted

## 2023-03-20 ENCOUNTER — Telehealth: Payer: Self-pay | Admitting: Pharmacy Technician

## 2023-03-20 DIAGNOSIS — Z5986 Financial insecurity: Secondary | ICD-10-CM

## 2023-03-20 DIAGNOSIS — I5033 Acute on chronic diastolic (congestive) heart failure: Secondary | ICD-10-CM

## 2023-03-20 DIAGNOSIS — I482 Chronic atrial fibrillation, unspecified: Secondary | ICD-10-CM

## 2023-03-20 NOTE — Progress Notes (Signed)
[facility] Customer service manager [facility])  [facility] Quality Pharmacy Team   03/20/2023  ALUCARD KOVALCHUK 13-Aug-1951 [id number]  Reason for referral: Medication assistance  Referral source: [facility] RN [name] Current insurance:  [facility] PDP  Outreach:  Successful telephone call with patient.  HIPAA identifiers verified. Patient informs he files taxes and he is a household of 2. Patient was provided the income requirements for BMS for Eliquis, [employer]ME for [place] and [employer] for [employer]. Patient reports his income is below the levels indicated. Patient also informs he believes he has met the 3% OOP requirement for BMS. Patient informs he was [facility] Part D PDP and he goes to the [facility] on [facility] for maintenance of these 3 medications.Therefore, will mail out applications to the patient.  Medication Assistance Findings:  Medication assistance needs identified: Trulicity (already receiving patient assistance), Eliquis, Marcelline Deist and Entresto  Extra Help:  Not eligible for Extra Help Low Income Subsidy based on reported income and assets  Additional medication assistance options reviewed with patient as warranted:  No other options identified  Plan: I will route patient assistance letter to [facility] pharmacy technician who will coordinate patient assistance program application process for medications listed above.  [facility] pharmacy technician will assist with obtaining all required documents from both patient and provider(s) and submit application(s) once completed.  Thank you for allowing [facility] pharmacy to be a part of this patient's care.   [name], CPhT [place]  [facility] Office: [phone] Fax: [phone] Email: [email or web address]

## 2023-03-20 NOTE — Patient Outreach (Signed)
  Care Coordination   Follow Up Visit Note   03/20/2023 Name: [name] MRN: [id number] DOB: November 21, 1950  [name] is a 72 y.o. year old male who sees Donetta Potts, MD for primary care. I spoke with  [name] by phone today.  What matters to the patients health and wellness today?  Managing cardiac conditions and getting more information on prescription assistance for Farxiga and Eliquis    Goals Addressed             This Visit's Progress    Manage Cardiac Conditions (Afib, CHF, Recent MI)       Care Coordination Goals: Patient will take medications as directed Patient will talk with Pharmacy Team re: prescription assistance for Farxiga and Eliquis. He has prescription assistance for Trulicity Patient will keep all medical appointments Sharlene Dory, NP (cardio) 04/11/23 Patient will schedule cardiac rehab Rehab wanted to wait until after next visit with cardio to schedule Patient will notify provider of any new or worsening symptoms Patient will check and record blood pressure daily and reach out to provider with any readings outside of recommended range Blood pressure readings are sent directly to PCP office from monitor Patient will check and record weight each morning after urinating and will call cardiologist with weight gain of >3 lbs overnight of >5 lbs in one week Patient will follow a low sodium heart healthy diet Patient will call RN Care Coordinator [phone] with any resource or care coordination needs         SDOH assessments and interventions completed:  Yes  SDOH Interventions Today    Flowsheet Row Most Recent Value  SDOH Interventions   Transportation Interventions Intervention Not Indicated  Physical Activity Interventions Patient Declined        Care Coordination Interventions:  Yes, provided  Interventions Today    Flowsheet Row Most Recent Value  Chronic Disease   Chronic disease during today's visit Congestive  Heart Failure (CHF), Atrial Fibrillation (AFib), Hypertension (HTN)  General Interventions   General Interventions Discussed/Reviewed General Interventions Discussed, General Interventions Reviewed, Durable Medical Equipment (DME), Community Resources, Doctor Visits  Doctor Visits Discussed/Reviewed Doctor Visits Discussed, Doctor Visits Reviewed, PCP, Specialist  [facility] notes and discharge summaries]  Durable Medical Equipment (DME) BP Cuff, Other  [scales, cane]  PCP/Specialist Visits Compliance with follow-up visit  Sharlene Dory, NP (cardio) on 04/11/23]  Exercise Interventions   Exercise Discussed/Reviewed Exercise Discussed, Exercise Reviewed, Physical Activity  Physical Activity Discussed/Reviewed Physical Activity Discussed, Physical Activity Reviewed  [Cardiac rehab recommended but rehab wanted to wait until after next visit with cardio to schedule]  Education Interventions   Education Provided Provided Education  Provided Verbal Education On When to see the doctor, [facility], Nutrition, Exercise, Mental Health/Coping with Illness, Medication  [blood pressure monitoring, daily weights]  Nutrition Interventions   Nutrition Discussed/Reviewed Nutrition Discussed, Nutrition Reviewed, Fluid intake  Pharmacy Interventions   Pharmacy Dicussed/Reviewed Pharmacy Topics Discussed, Pharmacy Topics Reviewed, Medications and their functions, Affording Medications, Referral to Pharmacist  Referral to Pharmacist --  Valda Lamb Trulicity through [employer]. Would like to apply for assistance with Eliquis and Farxiga.]  Safety Interventions   Safety Discussed/Reviewed Safety Discussed, Safety Reviewed       Follow up plan: Follow up call scheduled for 04/03/23    Encounter Outcome:  Pt. Visit Completed   [name], BSN, RN-BC RN Care Coordinator [facility]  [facility] Direct Dial: [phone] Main #: [phone]

## 2023-03-22 ENCOUNTER — Encounter: Payer: Self-pay | Admitting: Podiatry

## 2023-03-22 ENCOUNTER — Ambulatory Visit (INDEPENDENT_AMBULATORY_CARE_PROVIDER_SITE_OTHER): Payer: Medicare Other | Admitting: Podiatry

## 2023-03-22 DIAGNOSIS — Z9889 Other specified postprocedural states: Secondary | ICD-10-CM

## 2023-03-22 NOTE — Progress Notes (Signed)
Subjective:  Patient ID: [name], male    DOB: 1951/01/27,  MRN: [id number]  Chief Complaint  Patient presents with   Routine Post Op    DOS: 02/07/23 Procedure: Right second digit partial amputation "Its doing fine, no problems"    DOS: 02/07/23 Procedure: Right second digit partial amputation  72 y.o. male returns for POV#3. Doing well with minimal pain  Review of Systems: Negative except as noted in the HPI. Denies N/V/F/Ch.  Past Medical History:  Diagnosis Date   A-fib [facility])    CAD (coronary artery disease)    Hypertension    Obstructive sleep apnea    CPAP   Rosacea    Type 2 diabetes mellitus with hyperglycemia (HCC)     Current Outpatient Medications:    amLODipine (NORVASC) 10 MG tablet, Take 10 mg by mouth daily., Disp: , Rfl:    aspirin EC 81 MG tablet, Take 1 tablet (81 mg total) by mouth daily. Swallow whole., Disp: 90 tablet, Rfl: 3   dapagliflozin propanediol (FARXIGA) 10 MG TABS tablet, Take 1 tablet (10 mg total) by mouth daily., Disp: 30 tablet, Rfl: 5   Dulaglutide (TRULICITY) 4.5 MG/0.5ML SOPN, Inject 4.5 mg into the skin once a week., Disp: , Rfl:    ELIQUIS 5 MG TABS tablet, TAKE 1 TABLET BY MOUTH TWICE A DAY, Disp: 60 tablet, Rfl: 5   ergocalciferol (VITAMIN D2) 1.25 MG (50000 UT) capsule, Take 50,000 Units by mouth once a week., Disp: , Rfl:    furosemide (LASIX) 20 MG tablet, TAKE 1 TABLET BY MOUTH DAILY AS NEEDED (WEIGHT GAIN OF 3-5LB OR INCREASED SHORTNESS OF BREATH). (Patient taking differently: Take 20 mg by mouth daily as needed for fluid.), Disp: 90 tablet, Rfl: 1   glipiZIDE (GLUCOTROL) 10 MG tablet, Take 5 mg by mouth 2 (two) times daily before a meal., Disp: , Rfl:    metFORMIN (GLUCOPHAGE-XR) 500 MG 24 hr tablet, Take 1 tablet (500 mg total) by mouth daily with breakfast., Disp: , Rfl:    metoprolol tartrate (LOPRESSOR) 25 MG tablet, Take 0.5 tablets (12.5 mg total) by mouth 2 (two) times daily., Disp: 90 tablet, Rfl: 3   nitroGLYCERIN  (NITROSTAT) 0.4 MG SL tablet, Place 1 tablet (0.4 mg total) under the tongue every 5 (five) minutes x 3 doses as needed for chest pain., Disp: 25 tablet, Rfl: 1   rosuvastatin (CRESTOR) 10 MG tablet, Take 1 tablet (10 mg total) by mouth daily., Disp: 90 tablet, Rfl: 3   sacubitril-valsartan (ENTRESTO) 24-26 MG, Take 1 tablet by mouth 2 (two) times daily., Disp: 60 tablet, Rfl: 11  Social History   Tobacco Use  Smoking Status Never  Smokeless Tobacco Current   Types: Snuff  Tobacco Comments   Uses Snus     No Known Allergies Objective:  There were no vitals filed for this visit. There is no height or weight on file to calculate BMI. Constitutional Well developed. Well nourished.  Vascular Foot warm and well perfused. Capillary refill normal to all digits.   Neurologic Normal speech. Oriented to person, place, and time. Epicritic sensation to light touch grossly present bilaterally.  Dermatologic Skin healing well without signs of infection. Skin edges well coapted without signs of infection.  Orthopedic: Tenderness to palpation noted about the surgical site.    Assessment:   1. Post-operative state    Plan:  Patient was evaluated and treated and all questions answered.  S/p foot surgery right -Progressing as expected post-operatively. -WB Status:  WBAT in regular shoe.  -Discussed getting into diabetic shoes the next visit if possible.  Patient discharged from surgical standpoint.   Follow-up in 3 months for rfc.   No follow-ups on file.

## 2023-03-23 ENCOUNTER — Ambulatory Visit: Payer: Medicare Other | Admitting: Nurse Practitioner

## 2023-03-23 ENCOUNTER — Telehealth: Payer: Self-pay | Admitting: Pharmacy Technician

## 2023-03-23 DIAGNOSIS — H3582 Retinal ischemia: Secondary | ICD-10-CM | POA: Diagnosis not present

## 2023-03-23 DIAGNOSIS — Z5986 Financial insecurity: Secondary | ICD-10-CM

## 2023-03-23 DIAGNOSIS — H43823 Vitreomacular adhesion, bilateral: Secondary | ICD-10-CM | POA: Diagnosis not present

## 2023-03-23 DIAGNOSIS — E113313 Type 2 diabetes mellitus with moderate nonproliferative diabetic retinopathy with macular edema, bilateral: Secondary | ICD-10-CM | POA: Diagnosis not present

## 2023-03-23 DIAGNOSIS — H35033 Hypertensive retinopathy, bilateral: Secondary | ICD-10-CM | POA: Diagnosis not present

## 2023-03-23 NOTE — Progress Notes (Signed)
[facility])                                            [facility] Quality Pharmacy Team    03/23/2023  [name] 08-02-1951 [id number]                                      Medication Assistance Referral  Referral From:  [facility] RN  [name]   Medication/Company: Eliquis / BMS Patient application portion:  Mailed Provider application portion: Faxed  to Dr. [name] Provider address/fax verified via: Office website  Medication/Company: Marcelline Deist / AZ&ME Patient application portion:  Mailed Provider application portion: Faxed  to Dr. [name] Provider address/fax verified via: Office website  Medication/Company: Sherryll Burger / [employer] Patient application portion:  Mailed Provider application portion: Faxed  to [name], [facility] Provider address/fax verified via: Office website   [name], CPhT [place]  [facility] Office: [phone] Fax: [phone] Email: [name]

## 2023-03-31 ENCOUNTER — Ambulatory Visit: Payer: Medicare Other | Attending: Nurse Practitioner | Admitting: Nurse Practitioner

## 2023-03-31 ENCOUNTER — Encounter: Payer: Self-pay | Admitting: Nurse Practitioner

## 2023-03-31 VITALS — BP 126/70 | HR 61 | Ht 69.0 in | Wt 178.0 lb

## 2023-03-31 DIAGNOSIS — N1831 Chronic kidney disease, stage 3a: Secondary | ICD-10-CM | POA: Diagnosis not present

## 2023-03-31 DIAGNOSIS — I4892 Unspecified atrial flutter: Secondary | ICD-10-CM | POA: Diagnosis not present

## 2023-03-31 DIAGNOSIS — E785 Hyperlipidemia, unspecified: Secondary | ICD-10-CM | POA: Insufficient documentation

## 2023-03-31 DIAGNOSIS — I5022 Chronic systolic (congestive) heart failure: Secondary | ICD-10-CM | POA: Diagnosis not present

## 2023-03-31 DIAGNOSIS — I10 Essential (primary) hypertension: Secondary | ICD-10-CM | POA: Diagnosis not present

## 2023-03-31 DIAGNOSIS — I2510 Atherosclerotic heart disease of native coronary artery without angina pectoris: Secondary | ICD-10-CM | POA: Diagnosis not present

## 2023-03-31 DIAGNOSIS — I214 Non-ST elevation (NSTEMI) myocardial infarction: Secondary | ICD-10-CM | POA: Insufficient documentation

## 2023-03-31 DIAGNOSIS — I471 Supraventricular tachycardia, unspecified: Secondary | ICD-10-CM | POA: Insufficient documentation

## 2023-03-31 DIAGNOSIS — I255 Ischemic cardiomyopathy: Secondary | ICD-10-CM | POA: Insufficient documentation

## 2023-03-31 DIAGNOSIS — E1169 Type 2 diabetes mellitus with other specified complication: Secondary | ICD-10-CM | POA: Diagnosis not present

## 2023-03-31 DIAGNOSIS — I480 Paroxysmal atrial fibrillation: Secondary | ICD-10-CM | POA: Insufficient documentation

## 2023-03-31 DIAGNOSIS — I739 Peripheral vascular disease, unspecified: Secondary | ICD-10-CM | POA: Insufficient documentation

## 2023-03-31 NOTE — Progress Notes (Unsigned)
Cardiology Office Note:  .   Date:  03/31/2023 ID:  [name], DOB Dec 10, 1950, MRN [id number] PCP: Donetta Potts, MD  [facility] Providers Cardiologist:  Rollene Rotunda, MD PV Cardiologist:  Lorine Bears, MD {  History of Present Illness: .   [name] is a very pleasant 72 y.o. male with a PMH of CAD, s/p PCI in 2001, s/p CABG x 5 (LIMA - diagnonal, sequential SVG - RI and OM1, sequential SVT - PDA and PK in 2003), HFmrEF, PAF/A-flutter/SVT, HTN, HLD, PAD, T2DM, CKD, GERD, and OSA. Closely has been followed by Dr. Antoine Poche and Dr. Kirke Corin.   Admitted 2022 for syncope. Cardiac cath revealed 3/5 patent grafts. Cardiac monitor revealed A-fib with HR with rates as low as 30's while sleeping predominantly.   Admitted 11/2022 with volume overload. TTE EF 50-55%, wall motion abnormality. Did have intermittent A-fib overall rate controlled, was resumed on Elqius. 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 Renal artery duplex showed 1-59% left RAS, patent left renal artery, right side had abnormal right resistive index but no RAS.   Recently presented to [facility] for SOB, found to be A-fib in 110's. Given Metoprolol, converted to NSR. BNP 589, given IB Lasix. EKG was read as possible ectopic Atach, though suspect was A-flutter 2:1, 120 bpm. Due to trop elevation, was transferred to [facility]. TTE EF 45-50%. Underwent cardiac cath that found severe native CAD with 3/5 grafts patent - see report below, no acute lesions found. Was started on Entresto for ICM.   Today he presents for follow-up with his wife. Doing very well. Denies any chest pain, shortness of breath, palpitations, syncope, presyncope, dizziness, orthopnea, PND, swelling or significant weight changes, acute bleeding, or claudication. Says right femoral access site from cardiac cath is healing well. Did admit to issues with cough, recently was started on Pepcid at [facility] office and has  felt much better with improvement in symptoms.   Studies Reviewed: Marland Kitchen    LHC 02/2023:    --------------NATIVE VESSELS--------------   Prox RCA to Mid RCA lesion is 60% stenosed. Mid RCA to Dist RCA lesion is 90% stenosed. Dist RCA lesion is 100% stenosed with 100% stenosed side branch in RPAV. 2nd RPL lesion is 100% stenosed. RPDA lesion is 100% stenosed. - STABLE   Prox LAD to Mid LAD is 100% stenosed at proximal edge of prior stenT / SP1 - STABLE   Previously grafted ramus lesion is 100% stenosed. STABLE   2nd Mrg SUPERIOR branch is 80% stenosed. (Prieviously grafted)   --------------GRAFTS-------------------   Seq SVG- -RI-OM1 graft was visualized by angiography.  The graft exhibits severe diffuse disease. Origin to Prox Graft lesion is 100% stenosed.   Seq SVG- RPDA-RPL graft was visualized by angiography and is normal in caliber.  Prox Graft lesion before RPDA is 40% stenosed.   LIMA-LAD(Diag) graft was visualized by angiography and is normal in caliber, fills Diag branch only.  Mid LAD to Dist LAD lesion is 100% stenosed.- beyond LIMA-LAD insertion   ------------HEMODYNAMICS------------------   LV end diastolic pressure is mildly elevated.   There is no aortic valve stenosis.   -----------------RECOMMENDATIONS--------------------   Recommend to resume Apixaban, at currently prescribed dose and frequency on 03/07/2023.   Recommend concurrent antiplatelet therapy of Aspirin 81 mg daily for LONG-TERM.   Severe Native CAD with 3/5 grafts patent (except LIMA only fills a Diag branch & not the distal LAD that is atretic & occluded). Native  Vessels LAD 100% CTO af 1st Sept (prox edge of stent) RI 100 % CTO, superior branch of OM1 99%, native Cx - mild diffuse disease Diffuse mid-distal RCA ~70-80% irregular diseae before 100% CTO in distal vessel prior to bifurcation.  Grafts: Known occlusion of SeqSVG-RI-OM1 Patent LIMA-LAD/Diag with atretic/100% LAD Patent Seq SVG-RPDA-RPL1 (wiht very  small RPDA & moderate RPL & no retrograde flow to bifurcation LVEDP 18 mmHg  No acute lesion found     RECOMMENDATIONS Continue GDMT for ICM/CHF with A-fib.  Suspected troponin elevation is related to demand ischemia. Restart Eliquis in the morning, will start heparin 8 hours after Mynx closure.   Results communicated to Dr. Rennis Golden  Echo 02/2023: 1. Global mild LV hypokinesis with focal severe apical hypokinesis. Left  ventricular ejection fraction, by estimation, is 45 to 50%. The left  ventricle has mildly decreased function. The left ventricle demonstrates  global hypokinesis. The left  ventricular internal cavity size was moderately dilated. Left ventricular  diastolic function could not be evaluated.   2. Right ventricular systolic function is mildly reduced. The right  ventricular size is moderately enlarged. There is mildly elevated  pulmonary artery systolic pressure.   3. Left atrial size was mildly dilated.   4. Right atrial size was mildly dilated.   5. The mitral valve is normal in structure. Trivial mitral valve  regurgitation. No evidence of mitral stenosis.   6. The aortic valve is grossly normal. There is mild calcification of the  aortic valve. There is mild thickening of the aortic valve. Aortic valve  regurgitation is not visualized. No aortic stenosis is present.   7. The inferior vena cava is normal in size with <50% respiratory  variability, suggesting right atrial pressure of 8 mmHg.   Comparison(s): No significant change from prior study.  Vascular US renal artery bilateral 11/2022:  Right: No evidence of right renal artery stenosis. Abnormal right         Resistive Index. Normal size right kidney.  Left:  1-59% stenosis of the left renal artery. Abnormal left         Resisitve Index. Normal size of left kidney. Patent LRA with         mildly elevated velocities without significant stenosis.  Abdominal aortogram: 1.  Subtotally occluded left renal artery  with atrophy of left kidney. 2.  No significant aortoiliac disease. 3.  Right lower extremity: Calcified vessels with mild nonobstructive disease affecting the SFA and popliteal arteries, two-vessel runoff below the knee via the posterior tibial and peroneal artery.  Anterior tibial artery occluded distally and the dorsalis pedis fills retrograde from the pedal arch.   Recommendations: The patient has inline flow to the right toes via the posterior tibial artery.  No indication for revascularization.  Continue medical therapy. Resume Eliquis tomorrow as long as no bleeding issues. 50 mL of contrast was used for the procedure.  The patient did develop shortness of breath and hypoxia with IV fluid hydration and was given 1 dose of IV furosemide before the procedure.  He can be discharged home as long as he is not requiring any supplemental oxygen. Will plan on obtaining renal artery duplex as an outpatient to evaluate left kidney size and flow to make decision regarding left renal artery revascularization  Cardiac monitor 02/2021:  Patch Wear Time: 13 days and 23 hours (2022-05-13T12:17:15-0400 to 2022-05-27T11:22:43-0400) Atrial Fibrillation occurred continuously (100% burden), ranging from 28-123 bpm (avg of 60 bpm). 3 Pauses occurred, the longest lasting 4.7  secs (13 bpm). Isolated VEs were rare (<1.0%, 8592), VE Couplets were rare (<1.0%, 115), and VE Triplets were rare (<1.0%, 1). MD notification criteria for Slow Atrial Fibrillation met - Notified Melinda P LPN on 12/19/8117 at 9:20 AM EDT (KT)   Atrial fib and flutter slow ventricular rate  Frequent ventricular pauses with the longest pause being 4.7 seconds Rare ventricular ectopy with one triplet  Right/Left heart cath 01/2021:  Ost LAD to Prox LAD lesion is 100% stenosed. Ramus lesion is 100% stenosed. 2nd Mrg lesion is 80% stenosed. Prox RCA to Mid RCA lesion is 60% stenosed. Mid RCA to Dist RCA lesion is 90% stenosed. Origin to Mid  Graft lesion before Ramus is 100% stenosed. RPDA lesion is 100% stenosed. 2nd RPL lesion is 100% stenosed.  Risk Assessment/Calculations:    CHA2DS2-VASc Score = 5  This indicates a 7.2% annual risk of stroke. The patient's score is based upon: CHF History: 1 HTN History: 1 Diabetes History: 1 Stroke History: 0 Vascular Disease History: 1 Age Score: 1 Gender Score: 0  Physical Exam:   VS:  BP 126/70   Pulse 61   Ht 5\' 9"  (1.753 m)   Wt 178 lb (80.7 kg)   SpO2 98%   BMI 26.29 kg/m    Wt Readings from Last 3 Encounters:  03/31/23 178 lb (80.7 kg)  03/07/23 178 lb 9.2 oz (81 kg)  02/17/23 181 lb 6.4 oz (82.3 kg)    GEN: Well nourished, well developed in no acute distress NECK: No JVD; No carotid bruits CARDIAC: S1/S2, RRR, no murmurs, rubs, gallops RESPIRATORY:  Clear to auscultation without rales, wheezing or rhonchi  ABDOMEN: Soft, non-tender, non-distended EXTREMITIES:  No edema; No deformity   ASSESSMENT AND PLAN: .    CAD, s/p CABG, hx of NSTEMI Transferred from [facility] to [facility] to troponin elevation LHC revealed no acute lesions noted on cath, recommended medical management. Was felt that troponin elevation was d/t demand ischemia. Denies any chest pain. Continue Aspirin, Lopressor, Crestor, and NTG PRN. Heart healthy diet and regular cardiovascular exercise encouraged.    Cardiac Rehabilitation Eligibility Assessment  The patient is ready to start cardiac rehabilitation from a cardiac standpoint.    2. PAF/A-flutter, SVT Known hx of Aflutter/A-fib. Denies any recent tachycardia or palpitations. HR well controlled today. Continue Lopressor 12.5 mg BID. Continue Eliquis 5 mg BID for CHA2DS2-VASc score of 5. He is on appropriate dosing, denies any bleeding issues.   3. HFmrEF, Ischemic Cardiomyopathy Stage C, NYHA class I symptoms. TTE 02/2023 admission revealed EF 45-50%. Euvolemic and well compensated on exam. Continue Farxiga, Lasix, Lopressor, and Entresto. Just had  labs obtained with PCP - will request BMET.  Low sodium diet, fluid restriction <2L, and daily weights encouraged. Educated to contact our office for weight gain of 2 lbs overnight or 5 lbs in one week. Will update Echo in 3 months to evaluate EF.   4. PAD, HLD Denies any claudication symptoms. LDL 84 11/2022. Continue Crestor 10 mg daily. Heart healthy diet and regular cardiovascular exercise encouraged. Continue to follow-up with Dr. Kirke Corin.   5. CKD stage 3a Most recent sCr obtained was 1.12 with eGFR > 60. Will request BMET recently obtained with PCP. Avoid nephrotoxic agents. No medication changes at this time. Continue to follow with PCP.  Dispo: Follow-up with me or APP in 4 months or sooner if anything changes.   Signed, Sharlene Dory, NP

## 2023-03-31 NOTE — Patient Instructions (Signed)
Medication Instructions:  Your physician recommends that you continue on your current medications as directed. Please refer to the Current Medication list given to you today.   Labwork: None  Testing/Procedures: Your physician has requested that you have an echocardiogram. Echocardiography is a painless test that uses sound waves to create images of your heart. It provides your doctor with information about the size and shape of your heart and how well your heart's chambers and valves are working. This procedure takes approximately one hour. There are no restrictions for this procedure. Please do NOT wear cologne, perfume, aftershave, or lotions (deodorant is allowed). Please arrive 15 minutes prior to your appointment time.   Follow-Up: Your physician recommends that you schedule a follow-up appointment in: 4 months  Any Other Special Instructions Will Be Listed Below (If Applicable).  HEART FAILURE INSTRUCTION SHEET  Follow a low-salt diet-you are allowed no more than 2,000 mg of sodium per day. Watch your fluid intake. In general, you should not be taking more than 64 ounces a day (no more than 8 glasses per day). Sometimes we refer to this as "2 liters per day." This includes sources of water in food like soup, coffee, tea, milk etc. Weigh yourself on the same scale at the same time of the day preferably immediately after your first void. Keep a log of your weights. Call your doctor: (Anytime you feel any of the following symptoms)  3 lbs weight gain overnight or 5 lbs within a week Shortness of breath, with or without a day hacking cough Swelling in hands, feet or stomach If you have to sleep on extra pillows at night in order to breathe   IT IS IMPORTANT TO LET YOUR DOCTOR KNOW EARLY ON IF YOU ARE HAVING SYMPTOMS SO WE CAN HELP YOU!    If you need a refill on your cardiac medications before your next appointment, please call your pharmacy.

## 2023-04-03 ENCOUNTER — Encounter: Payer: Medicare Other | Admitting: *Deleted

## 2023-04-03 DIAGNOSIS — L609 Nail disorder, unspecified: Secondary | ICD-10-CM | POA: Diagnosis not present

## 2023-04-03 DIAGNOSIS — E1140 Type 2 diabetes mellitus with diabetic neuropathy, unspecified: Secondary | ICD-10-CM | POA: Diagnosis not present

## 2023-04-05 DIAGNOSIS — I10 Essential (primary) hypertension: Secondary | ICD-10-CM | POA: Diagnosis not present

## 2023-04-05 DIAGNOSIS — E118 Type 2 diabetes mellitus with unspecified complications: Secondary | ICD-10-CM | POA: Diagnosis not present

## 2023-04-05 DIAGNOSIS — I2510 Atherosclerotic heart disease of native coronary artery without angina pectoris: Secondary | ICD-10-CM | POA: Diagnosis not present

## 2023-04-05 DIAGNOSIS — E1165 Type 2 diabetes mellitus with hyperglycemia: Secondary | ICD-10-CM | POA: Diagnosis not present

## 2023-04-05 DIAGNOSIS — Z6826 Body mass index (BMI) 26.0-26.9, adult: Secondary | ICD-10-CM | POA: Diagnosis not present

## 2023-04-05 DIAGNOSIS — I509 Heart failure, unspecified: Secondary | ICD-10-CM | POA: Diagnosis not present

## 2023-04-05 DIAGNOSIS — I4891 Unspecified atrial fibrillation: Secondary | ICD-10-CM | POA: Diagnosis not present

## 2023-04-05 DIAGNOSIS — E1169 Type 2 diabetes mellitus with other specified complication: Secondary | ICD-10-CM | POA: Diagnosis not present

## 2023-04-05 DIAGNOSIS — I739 Peripheral vascular disease, unspecified: Secondary | ICD-10-CM | POA: Diagnosis not present

## 2023-04-05 DIAGNOSIS — E7801 Familial hypercholesterolemia: Secondary | ICD-10-CM | POA: Diagnosis not present

## 2023-04-06 DIAGNOSIS — I4891 Unspecified atrial fibrillation: Secondary | ICD-10-CM | POA: Diagnosis not present

## 2023-04-06 DIAGNOSIS — I2510 Atherosclerotic heart disease of native coronary artery without angina pectoris: Secondary | ICD-10-CM | POA: Diagnosis not present

## 2023-04-06 DIAGNOSIS — E1169 Type 2 diabetes mellitus with other specified complication: Secondary | ICD-10-CM | POA: Diagnosis not present

## 2023-04-11 ENCOUNTER — Telehealth: Payer: Self-pay | Admitting: Pharmacy Technician

## 2023-04-11 ENCOUNTER — Encounter: Payer: Self-pay | Admitting: *Deleted

## 2023-04-11 ENCOUNTER — Ambulatory Visit: Payer: Medicare Other | Admitting: Nurse Practitioner

## 2023-04-11 ENCOUNTER — Ambulatory Visit: Payer: Self-pay | Admitting: *Deleted

## 2023-04-11 DIAGNOSIS — Z5986 Financial insecurity: Secondary | ICD-10-CM

## 2023-04-11 NOTE — Progress Notes (Signed)
[facility])                                            [facility] Quality Pharmacy Team    04/11/2023  [name] [birth year] [id number]  Successful outreach to patient today in regard to 3 applications that were mailed to him on 7/11 for Eiquis with [employer], Farxiga with [employer] and Entresto with [employer].  Spoke to patient, HIPAA verified.  Inquired if patient had received the applications that were mailed to him on 03/23/23 and patient informs that [facility] Pharmacist Dawn Pettus who is embedded at [facility] is now assisting him with medication assistance and she informed him that he was approved for [place] but not with Eliquis as he has not met the OOP requirement.  Since practice is assisting patient, [facility] will close patient case as this time but will gladly assist if the need arises in the future.  [name], CPhT [place]  [facility] Office: [phone] Fax: [phone] Email: [email or web address]

## 2023-04-12 NOTE — Patient Outreach (Signed)
Care Coordination   Follow Up Visit Note   04/11/2023 Name: [name] MRN: [id number] DOB: [birth year]  NTHONY [name] is a 72 y.o. year old male who sees Thomas Potts, MD for primary care. I spoke with  [name] by phone today.  What matters to the patients health and wellness today?  Getting medications cheaper    Goals Addressed             This Visit's Progress    Manage Cardiac Conditions (Afib, CHF, Recent MI)       Care Coordination Goals: Patient will take medications as directed Patient will talk with [name], PharmD ([facility] Pharmacist with [facility]) re: prescription assistance for Eliquis He has prescription assistance for Trulicity Recently approved for assistance with [name] and Entresto. Will likely qualify for Eliquis this month.  Patient will keep all medical appointments Patient will schedule cardiac rehab Patient will notify provider of any new or worsening symptoms Patient will check and record blood pressure daily and reach out to provider with any readings outside of recommended range Blood pressure readings are sent directly to PCP office from monitor Patient will check and record weight each morning after urinating and will call cardiologist with weight gain of >3 lbs overnight of >5 lbs in one week Patient will follow a low sodium heart healthy diet  Less than 2,000mg  of sodium per day < 64 oz of fluids per day Patient will call RN Care Coordinator [phone] with any resource or care coordination needs         SDOH assessments and interventions completed:  Yes  SDOH Interventions Today    Flowsheet Row Most Recent Value  SDOH Interventions   Transportation Interventions Intervention Not Indicated  Financial Strain Interventions Other (Comment)  [referred to pharmacy team for assistance with medication cost]        Care Coordination Interventions:  Yes, provided  Interventions Today    Flowsheet Row Most Recent  Value  Chronic Disease   Chronic disease during today's visit Congestive Heart Failure (CHF), Atrial Fibrillation (AFib)  General Interventions   General Interventions Discussed/Reviewed General Interventions Discussed, General Interventions Reviewed, Doctor Visits, Durable Medical Equipment (DME)  [weight stable at 178. Blood pressure readings go to PCP for review. No readingsa available at time of call but reports that they are within range.]  Doctor Visits Discussed/Reviewed Doctor Visits Discussed, Specialist, PCP, Doctor Visits Reviewed  [facility] cardiology visit on 03/31/23]  Durable Medical Equipment (DME) BP Cuff, Other  [scales]  PCP/Specialist Visits Compliance with follow-up visit  [schedule cardiac rehab. Patient was cleared to begin after cardiology visit on 03/31/23. Echo scheduled for 06/29/23 and cardiology f/u on 08/01/23]  Exercise Interventions   Exercise Discussed/Reviewed Physical Activity  [Performing ADLs independently]  Physical Activity Discussed/Reviewed Physical Activity Discussed, Physical Activity Reviewed  [Cardiac rehab encouraged]  Education Interventions   Education Provided Provided Education  Provided Verbal Education On Nutrition, When to see the doctor, Medication  Nutrition Interventions   Nutrition Discussed/Reviewed Nutrition Discussed, Nutrition Reviewed, Fluid intake, Decreasing salt  [2,000 mg per day salt restriction, 64 Oz fluid restriction]  Pharmacy Interventions   Pharmacy Dicussed/Reviewed Pharmacy Topics Discussed, Pharmacy Topics Reviewed, Medications and their functions, Affording Medications  [Started on Pepcid AC for cough by PCP. Has seen improvement. Referred to Pharmacy team for help w/ Eliquis, [name], & [name] but he is working w/ [name], PharmD at [employer]. Received assistance for [employer] Will reapply for Eliquis.]  Follow up plan: Follow up call scheduled for 05/12/23    Encounter Outcome:  Pt. Visit  Completed   [name], BSN, RN-BC RN Care Coordinator [facility]  [facility] Direct Dial: [phone] Main #: [phone]

## 2023-05-04 DIAGNOSIS — E113313 Type 2 diabetes mellitus with moderate nonproliferative diabetic retinopathy with macular edema, bilateral: Secondary | ICD-10-CM | POA: Diagnosis not present

## 2023-05-10 DIAGNOSIS — Z6825 Body mass index (BMI) 25.0-25.9, adult: Secondary | ICD-10-CM | POA: Diagnosis not present

## 2023-05-10 DIAGNOSIS — R030 Elevated blood-pressure reading, without diagnosis of hypertension: Secondary | ICD-10-CM | POA: Diagnosis not present

## 2023-05-10 DIAGNOSIS — D229 Melanocytic nevi, unspecified: Secondary | ICD-10-CM | POA: Diagnosis not present

## 2023-05-10 DIAGNOSIS — L570 Actinic keratosis: Secondary | ICD-10-CM | POA: Diagnosis not present

## 2023-05-12 ENCOUNTER — Encounter: Payer: Self-pay | Admitting: *Deleted

## 2023-05-12 ENCOUNTER — Ambulatory Visit: Payer: Self-pay | Admitting: *Deleted

## 2023-05-12 NOTE — Patient Outreach (Addendum)
Care Coordination   Follow Up Visit Note   05/12/2023 Name: [name] MRN: [id number] DOB: Feb 02, 1951  [name] is a 72 y.o. year old male who sees Donetta Potts, MD for primary care. I spoke with  [name] by phone today. Patient's blood sugar was elevated this morning. He attributes that to eating ribs and mashed potatoes for supper last night and knew that his blood sugar would likely be high because of that. States that it normally averages 110-115 fasting each morning.   What matters to the patients health and wellness today?  Obtaining prescription assistance for Eliquis. Patient will continue to work with Steve Rattler, PharmD on patient assistance application. He hasn't met his 3% out-of-pocket requirement yet.     Goals Addressed             This Visit's Progress    Manage Blood Sugar       Care Coordination Goals: Patient will follow a carbohydrate modified diet and limit or eliminate simple carbohydrates and sugars Patient will eat 3 meals per day with 30 grams of carbohydrates and up to 2 snacks per day if needed with less than 15 grams of carbohydrates Patient will keep follow-up appointment with endocrinologist on 05/22/23 Patient will call endocrinologist with any blood sugar readings less than 70 or with 3 readings in a row greater than 200     Manage Cardiac Conditions (Afib, CHF, Recent MI)   On track    Care Coordination Goals: Patient will talk with Steve Rattler, PharmD ([facility]) Re: prescription assistance for Eliquis Patient will notify provider of any new or worsening symptoms Patient will follow a low sodium heart healthy diet  Less than 2,000mg  of sodium per day < 64 oz of fluids per day Patient will call RN Care Coordinator [phone] with any resource or care coordination needs        SDOH assessments and interventions completed:  Yes SDOH Interventions Today    Flowsheet Row Most Recent Value  SDOH  Interventions   Transportation Interventions Intervention Not Indicated  Financial Strain Interventions Other (Comment)  [Working with Steve Rattler, PharmD at [employer] Re: patient assistance for Eliquis. No other financial resource concerns at this time.]       Care Coordination Interventions:  Yes, provided  Interventions Today    Flowsheet Row Most Recent Value  Chronic Disease   Chronic disease during today's visit Diabetes, Congestive Heart Failure (CHF)  General Interventions   General Interventions Discussed/Reviewed General Interventions Discussed, General Interventions Reviewed, Durable Medical Equipment (DME), Doctor Visits, Labs  Labs Hgb A1c every 3 months  Doctor Visits Discussed/Reviewed Doctor Visits Reviewed, Doctor Visits Discussed, Specialist  Durable Medical Equipment (DME) BP Cuff, Glucomoter  [Dexcom G7: Blood sugar 230 this morning. Normally averages between 110-115 fasting. Blood pressure reading unavailable but he checks it daily and it transmits automatically to his PCP office for review.]  PCP/Specialist Visits Compliance with follow-up visit  [endocrinologist on 05/22/23, podiatrist 06/26/23, Echo on 06/29/23, cardiologist 08/01/23]  Exercise Interventions   Exercise Discussed/Reviewed Exercise Discussed, Exercise Reviewed, Physical Activity  Physical Activity Discussed/Reviewed Physical Activity Discussed, Physical Activity Reviewed  [Feels well. Perfoming ADLs independently. Encouraged increased activity with goal of 150 minutes of moderate activity per week.]  Education Interventions   Education Provided Provided Education  Provided Verbal Education On Nutrition, Blood Sugar Monitoring, Labs, When to see the doctor, Medication, Exercise  Labs Reviewed Hgb A1c  [03/04/23 A1C 7.5%]  Nutrition  Interventions   Nutrition Discussed/Reviewed Nutrition Discussed, Nutrition Reviewed, Carbohydrate meal planning, Portion sizes, Decreasing sugar intake, Fluid intake,  Decreasing salt  [Encouraged 3 meals per day with 30 GM of CHO and up to 2 snacks if needed with less than 15 GM of CHO. Limit fuids to 64 oz per day. Limit sodium to less than 2 grams per day.]  Pharmacy Interventions   Pharmacy Dicussed/Reviewed Pharmacy Topics Discussed, Pharmacy Topics Reviewed, Medications and their functions, Affording Medications  [Working with Steve Rattler, PharmD with [facility] Re: patient assistance for Eliquis. Has not gone without medication.]      Follow up plan: Follow up call scheduled for 06/21/23    Encounter Outcome:  Pt. Visit Completed   [name], RN, BSN RN Care Coordinator [facility]  [facility] Direct Dial: [phone] Main #: [phone]

## 2023-05-22 ENCOUNTER — Encounter: Payer: Self-pay | Admitting: Nurse Practitioner

## 2023-05-22 ENCOUNTER — Ambulatory Visit (INDEPENDENT_AMBULATORY_CARE_PROVIDER_SITE_OTHER): Payer: Medicare Other | Admitting: Nurse Practitioner

## 2023-05-22 VITALS — BP 129/66 | HR 51 | Ht 69.0 in | Wt 176.0 lb

## 2023-05-22 DIAGNOSIS — Z7985 Long-term (current) use of injectable non-insulin antidiabetic drugs: Secondary | ICD-10-CM

## 2023-05-22 DIAGNOSIS — Z7984 Long term (current) use of oral hypoglycemic drugs: Secondary | ICD-10-CM

## 2023-05-22 DIAGNOSIS — E1165 Type 2 diabetes mellitus with hyperglycemia: Secondary | ICD-10-CM | POA: Diagnosis not present

## 2023-05-22 LAB — POCT GLYCOSYLATED HEMOGLOBIN (HGB A1C): Hemoglobin A1C: 7.1 % — AB (ref 4.0–5.6)

## 2023-05-22 LAB — POCT UA - MICROALBUMIN
Creatinine, POC: 50 mg/dL
Microalbumin Ur, POC: 30 mg/L

## 2023-05-22 NOTE — Progress Notes (Signed)
Endocrinology Follow Up Note       05/22/2023, 10:08 AM   Subjective:    Patient ID: [name], male    DOB: Oct 13, 1950.  [name] is being seen in follow up after being seen in consultation for management of currently uncontrolled symptomatic diabetes requested by  Donetta Potts, MD.   Past Medical History:  Diagnosis Date   A-fib [facility])    CAD (coronary artery disease)    Hypertension    Obstructive sleep apnea    CPAP   Rosacea    Type 2 diabetes mellitus with hyperglycemia [facility]     Past Surgical History:  Procedure Laterality Date   ABDOMINAL AORTOGRAM W/LOWER EXTREMITY Right 11/30/2022   Procedure: ABDOMINAL AORTOGRAM W/LOWER EXTREMITY;  Surgeon: Iran Ouch, MD;  Location: [facility] INVASIVE CV LAB;  Service: Cardiovascular;  Laterality: Right;   CORONARY ARTERY BYPASS GRAFT     LIMA to the LAD, sequential SVG to ramus intermediate and OM1, sequential SVG to PDA and posterior lateral   LEFT HEART CATH AND CORS/GRAFTS ANGIOGRAPHY N/A 03/06/2023   Procedure: LEFT HEART CATH AND CORS/GRAFTS ANGIOGRAPHY;  Surgeon: Marykay Lex, MD;  Location: [facility] INVASIVE CV LAB;  Service: Cardiovascular;  Laterality: N/A;   RIGHT/LEFT HEART CATH AND CORONARY/GRAFT ANGIOGRAPHY N/A 01/21/2021   Procedure: RIGHT/LEFT HEART CATH AND CORONARY/GRAFT ANGIOGRAPHY;  Surgeon: Runell Gess, MD;  Location: [facility] INVASIVE CV LAB;  Service: Cardiovascular;  Laterality: N/A;    Social History   Socioeconomic History   Marital status: Married    Spouse name: Production designer, theatre/television/film   Number of children: 3   Years of education: Not on file   Highest education level: Not on file  Occupational History   Not on file  Tobacco Use   Smoking status: Never   Smokeless tobacco: Current    Types: Snuff   Tobacco comments:    Uses Snus   Vaping Use   Vaping status: Never Used  Substance and Sexual Activity   Alcohol use: Yes     Comment: 1-2 beers a day   Drug use: Never   Sexual activity: Not on file  Other Topics Concern   Not on file  Social History Narrative   Lives with wife and two grandchildren.     Lives in a one story home    Right Handed    Drinks little caffeine    Social Determinants of Health   Financial Resource Strain: Medium Risk (05/12/2023)   Overall Financial Resource Strain (CARDIA)    Difficulty of Paying Living Expenses: Somewhat hard  Food Insecurity: No Food Insecurity (03/03/2023)   Hunger Vital Sign    Worried About Running Out of Food in the Last Year: Never true    Ran Out of Food in the Last Year: Never true  Transportation Needs: No Transportation Needs (05/12/2023)   PRAPARE - Administrator, Civil Service (Medical): No    Lack of Transportation (Non-Medical): No  Physical Activity: Inactive (03/20/2023)   Exercise Vital Sign    Days of Exercise per Week: 0 days    Minutes of Exercise per Session: 0 min  Stress: Not on file  Social Connections: Not on file    Family History  Problem Relation Age of Onset   Heart disease Mother 46   Diabetes Mother    Breast cancer Mother    Heart attack Mother    Brain cancer Mother    Heart disease Father    Kidney cancer Father        died from cancer   Heart attack Father 10   Cancer - Other Father        Spinal Cancer   Migraines Sister    Fibromyalgia Sister    Heart Problems Sister        Heart problems after birth of child   CAD Brother 85       CABG   Diabetes Brother     Outpatient Encounter Medications as of 05/22/2023  Medication Sig   amLODipine (NORVASC) 10 MG tablet Take 10 mg by mouth daily.   aspirin EC 81 MG tablet Take 1 tablet (81 mg total) by mouth daily. Swallow whole.   dapagliflozin propanediol (FARXIGA) 10 MG TABS tablet Take 1 tablet (10 mg total) by mouth daily.   Dulaglutide (TRULICITY) 4.5 MG/0.5ML SOPN Inject 4.5 mg into the skin once a week.   ELIQUIS 5 MG TABS tablet TAKE 1 TABLET  BY MOUTH TWICE A DAY   furosemide (LASIX) 20 MG tablet TAKE 1 TABLET BY MOUTH DAILY AS NEEDED (WEIGHT GAIN OF 3-5LB OR INCREASED SHORTNESS OF BREATH). (Patient taking differently: Take 20 mg by mouth daily as needed for fluid.)   glipiZIDE (GLUCOTROL) 5 MG tablet Take one tab by mouth every morning & 1/2 tab by mouth every evening   metFORMIN (GLUCOPHAGE-XR) 500 MG 24 hr tablet Take 1 tablet (500 mg total) by mouth daily with breakfast.   metoprolol tartrate (LOPRESSOR) 25 MG tablet Take 0.5 tablets (12.5 mg total) by mouth 2 (two) times daily.   nitroGLYCERIN (NITROSTAT) 0.4 MG SL tablet Place 1 tablet (0.4 mg total) under the tongue every 5 (five) minutes x 3 doses as needed for chest pain.   rosuvastatin (CRESTOR) 10 MG tablet Take 1 tablet (10 mg total) by mouth daily.   sacubitril-valsartan (ENTRESTO) 24-26 MG Take 1 tablet by mouth 2 (two) times daily.   ergocalciferol (VITAMIN D2) 1.25 MG (50000 UT) capsule Take 50,000 Units by mouth once a week. (Patient not taking: Reported on 05/22/2023)   No facility-administered encounter medications on file as of 05/22/2023.    ALLERGIES: No Known Allergies  VACCINATION STATUS: Immunization History  Administered Date(s) Administered   Td (Adult),5 Lf Tetanus Toxid, Preservative Free 07/19/2010    Diabetes He presents for his follow-up diabetic visit. He has type 2 diabetes mellitus. Onset time: diagnosed at approx age of 72. His disease course has been improving. There are no hypoglycemic associated symptoms. Associated symptoms include blurred vision, foot ulcerations and polyuria. There are no hypoglycemic complications. Symptoms are improving. Diabetic complications include heart disease (MI when he was 49), nephropathy, peripheral neuropathy, PVD and retinopathy. Risk factors for coronary artery disease include diabetes mellitus, dyslipidemia, family history, hypertension, male sex, obesity and sedentary lifestyle. Current diabetic treatment  includes oral agent (triple therapy) (and Trulicity). He is compliant with treatment most of the time. His weight is decreasing steadily. He is following a generally healthy diet. When asked about meal planning, he reported none. He has not had a previous visit with a dietitian. He rarely participates in exercise. His home blood glucose trend is decreasing steadily. (He presents today, accompanied  by his wife, with his CGM showing mostly at goal glycemic profile.  His POCT A1c today is 7.1%, improving from last visit of 7.5%.  Analysis of his CGM shows TIR 73%, TAR 27%, TBR 0% with a GMI of 7.1%.  He notes he still has some work to do with healthy breakfast options (where he tends to struggle the most).) An ACE inhibitor/angiotensin II receptor blocker is not being taken. He sees a podiatrist.Eye exam is current.     Review of systems  Constitutional: + decreasing body weight, current Body mass index is 25.99 kg/m., no fatigue, no subjective hyperthermia, no subjective hypothermia Eyes: + blurry vision (hx retinopathy), no xerophthalmia ENT: no sore throat, no nodules palpated in throat, no dysphagia/odynophagia, no hoarseness Cardiovascular: no chest pain, no shortness of breath, + intermittent palpitations-hx afib, no leg swelling Respiratory: no cough, no shortness of breath Gastrointestinal: no nausea/vomiting/diarrhea Musculoskeletal: no muscle/joint aches, walks with cane for frequent falls Skin: no rashes, no hyperemia, + chronic DM foot ulcer treated by [facility]- 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 Neurological: + chronic hand tremors with decreased dexterity, no numbness, no tingling, no dizziness Psychiatric: no depression, no anxiety  Objective:     BP 129/66 (BP Location: Left Arm, Patient Position: Sitting, Cuff Size: Large)   Pulse (!) 51   Ht 5\' 9"  (1.753 m)   Wt 176 lb (79.8 kg)   BMI 25.99 kg/m   Wt Readings from Last 3 Encounters:  05/22/23 176 lb (79.8 kg)   03/31/23 178 lb (80.7 kg)  03/07/23 178 lb 9.2 oz (81 kg)     BP Readings from Last 3 Encounters:  05/22/23 129/66  03/31/23 126/70  03/07/23 (!) 148/58     Physical Exam- Limited  Constitutional:  Body mass index is 25.99 kg/m. , not in acute distress, normal state of mind Eyes:  EOMI, no exophthalmos Musculoskeletal: walks with cane Skin:  no rashes, no hyperemia    Diabetic Foot Exam - Simple   Simple Foot Form Diabetic Foot exam was performed with the following findings: Yes 05/22/2023  9:59 AM  Visual Inspection See comments: Yes Sensation Testing Intact to touch and monofilament testing bilaterally: Yes Pulse Check Posterior Tibialis and Dorsalis pulse intact bilaterally: Yes Comments Right second toe amputation     CMP ( most recent) CMP     Component Value Date/Time   NA 134 (L) 03/06/2023 1318   NA 138 12/13/2022 0943   K 3.6 03/06/2023 1318   CL 104 03/06/2023 1318   CO2 20 (L) 03/06/2023 1318   GLUCOSE 109 (H) 03/06/2023 1318   BUN 22 03/06/2023 1318   BUN 14 12/13/2022 0943   CREATININE 1.12 03/06/2023 1318   CALCIUM 8.8 (L) 03/06/2023 1318   PROT 7.2 01/19/2021 2031   ALBUMIN 3.7 01/19/2021 2031   AST 17 01/19/2021 2031   ALT 19 01/19/2021 2031   ALKPHOS 91 01/19/2021 2031   BILITOT 1.2 01/19/2021 2031   GFRNONAA >60 03/06/2023 1318   GFRAA  07/24/2007 1130    >60        The eGFR has been calculated using the MDRD equation. This calculation has not been validated in all clinical     Diabetic Labs (most recent): Lab Results  Component Value Date   HGBA1C 7.1 (A) 05/22/2023   HGBA1C 7.5 (H) 03/04/2023   HGBA1C 8.4 (H) 11/30/2022   MICROALBUR 30 mg/L 05/22/2023     Lipid Panel ( most recent) Lipid Panel  Component Value Date/Time   CHOL 138 12/01/2022 0446   TRIG 74 12/01/2022 0446   HDL 39 (L) 12/01/2022 0446   CHOLHDL 3.5 12/01/2022 0446   VLDL 15 12/01/2022 0446   LDLCALC 84 12/01/2022 0446      Lab Results   Component Value Date   TSH 2.490 12/01/2022           Assessment & Plan:   1) Type 2 diabetes mellitus with hyperglycemia, without long-term current use of insulin (HCC)  He presents today, accompanied by his wife, with his CGM showing mostly at goal glycemic profile.  His POCT A1c today is 7.1%, improving from last visit of 7.5%.  Analysis of his CGM shows TIR 73%, TAR 27%, TBR 0% with a GMI of 7.1%.  He notes he still has some work to do with healthy breakfast options (where he tends to struggle the most).  - [name] has currently uncontrolled symptomatic type 2 DM since 72 years of age.   -Recent labs reviewed.  - I had a long discussion with him about the progressive nature of diabetes and the pathology behind its complications. -his diabetes is complicated by CAD, CHF, PAD, CKD stage 3a, DM foot ulcers and he remains at a high risk for more acute and chronic complications which include CAD, CVA, CKD, retinopathy, and neuropathy. These are all discussed in detail with him.  The following Lifestyle Medicine recommendations according to American College of Lifestyle Medicine [employer]) were discussed and offered to patient and he agrees to start the journey:  A. Whole Foods, Plant-based plate comprising of fruits and vegetables, plant-based proteins, whole-grain carbohydrates was discussed in detail with the patient.   A list for source of those nutrients were also provided to the patient.  Patient will use only water or unsweetened tea for hydration. B.  The need to stay away from risky substances including alcohol, smoking; obtaining 7 to 9 hours of restorative sleep, at least 150 minutes of moderate intensity exercise weekly, the importance of healthy social connections,  and stress reduction techniques were discussed. C.  A full color page of  Calorie density of various food groups per pound showing examples of each food groups was provided to the patient.  - Nutritional  counseling repeated at each appointment due to patients tendency to fall back in to old habits.  - The patient admits there is a room for improvement in their diet and drink choices. -  Suggestion is made for the patient to avoid simple carbohydrates from their diet including Cakes, Sweet Desserts / Pastries, Ice Cream, Soda (diet and regular), Sweet Tea, Candies, Chips, Cookies, Sweet Pastries, Store Bought Juices, Alcohol in Excess of 1-2 drinks a day, Artificial Sweeteners, Coffee Creamer, and "Sugar-free" Products. This will help patient to have stable blood glucose profile and potentially avoid unintended weight gain.   - I encouraged the patient to switch to unprocessed or minimally processed complex starch and increased protein intake (animal or plant source), fruits, and vegetables.   - Patient is advised to stick to a routine mealtimes to eat 3 meals a day and avoid unnecessary snacks (to snack only to correct hypoglycemia).  - I have approached him with the following individualized plan to manage his diabetes and patient agrees:   -Given his overall improvement, no changes will be made to his medications today.  He is advised to continue Farxiga 10 mg po daily, Trulicity 4.5 mg SQ weekly, Glipizide 5 mg po daily at  breakfast and Metformin 500 mg ER daily with breakfast.  -he is encouraged to start monitoring glucose 2 times daily (using his CGM), before breakfast and before bed, to log their readings on the clinic sheets provided, and bring them to review at follow up appointment in 4 weeks.  I sent in POGO meter for him but unfortunately, his insurance would not cover it.  He was approved for Dexcom G7.  - Adjustment parameters are given to him for hypo and hyperglycemia in writing. - he is encouraged to call clinic for blood glucose levels less than 70 or above 300 mg /dl.  - Specific targets for  A1c; LDL, HDL, and Triglycerides were discussed with the patient.  2) Blood Pressure  /Hypertension:  his blood pressure is controlled to target.   he is advised to continue his current medications including Norvasc 10 mg p.o. daily with breakfast and Lasix 20 mg po daily as needed for fluid.  3) Lipids/Hyperlipidemia:    Review of his recent lipid panel from 12/01/22 showed controlled LDL at 84 .  he is advised to continue Crestor 10 mg daily at bedtime.  Side effects and precautions discussed with him.  4)  Weight/Diet:  his Body mass index is 25.99 kg/m.  -   he is NOT a candidate for weight loss.  Exercise, and detailed carbohydrates information provided  -  detailed on discharge instructions.  5) Chronic Care/Health Maintenance: -he is not on ACEI/ARB and is on Statin medications and is encouraged to initiate and continue to follow up with Ophthalmology, Dentist, Podiatrist at least yearly or according to recommendations, and advised to stay away from smoking. I have recommended yearly flu vaccine and pneumonia vaccine at least every 5 years; moderate intensity exercise for up to 150 minutes weekly; and sleep for at least 7 hours a day.  - he is advised to maintain close follow up with Donetta Potts, MD for primary care needs, as well as his other providers for optimal and coordinated care.     I spent  36  minutes in the care of the patient today including review of labs from CMP, Lipids, Thyroid Function, Hematology (current and previous including abstractions from other facilities); face-to-face time discussing  his blood glucose readings/logs, discussing hypoglycemia and hyperglycemia episodes and symptoms, medications doses, his options of short and long term treatment based on the latest standards of care / guidelines;  discussion about incorporating lifestyle medicine;  and documenting the encounter. Risk reduction counseling performed per USPSTF guidelines to reduce obesity and cardiovascular risk factors.     Please refer to Patient Instructions for Blood Glucose  Monitoring and Insulin/Medications Dosing Guide"  in media tab for additional information. Please  also refer to " Patient Self Inventory" in the Media  tab for reviewed elements of pertinent patient history.  [name] participated in the discussions, expressed understanding, and voiced agreement with the above plans.  All questions were answered to his satisfaction. he is encouraged to contact clinic should he have any questions or concerns prior to his return visit.     Follow up plan: - Return in about 4 months (around 09/21/2023) for Diabetes F/U with A1c in office, No previsit labs, Bring meter and logs.   Ronny Bacon, [facility] [address] [place], [place] [address] Phone: [phone] Fax: [phone]  05/22/2023, 10:08 AM

## 2023-06-09 ENCOUNTER — Other Ambulatory Visit (HOSPITAL_COMMUNITY): Payer: Self-pay

## 2023-06-12 DIAGNOSIS — E1140 Type 2 diabetes mellitus with diabetic neuropathy, unspecified: Secondary | ICD-10-CM | POA: Diagnosis not present

## 2023-06-12 DIAGNOSIS — L609 Nail disorder, unspecified: Secondary | ICD-10-CM | POA: Diagnosis not present

## 2023-06-19 DIAGNOSIS — H3582 Retinal ischemia: Secondary | ICD-10-CM | POA: Diagnosis not present

## 2023-06-19 DIAGNOSIS — H35033 Hypertensive retinopathy, bilateral: Secondary | ICD-10-CM | POA: Diagnosis not present

## 2023-06-19 DIAGNOSIS — E113313 Type 2 diabetes mellitus with moderate nonproliferative diabetic retinopathy with macular edema, bilateral: Secondary | ICD-10-CM | POA: Diagnosis not present

## 2023-06-19 DIAGNOSIS — H43823 Vitreomacular adhesion, bilateral: Secondary | ICD-10-CM | POA: Diagnosis not present

## 2023-06-21 ENCOUNTER — Encounter: Payer: Self-pay | Admitting: *Deleted

## 2023-06-21 ENCOUNTER — Ambulatory Visit: Payer: Self-pay | Admitting: *Deleted

## 2023-06-22 NOTE — Patient Outreach (Signed)
Care Coordination   Follow Up Visit Note   06/21/2023 Name: [name] MRN: [id number] DOB: [birth year]  [name] is a 72 y.o. year old male who sees Thomas Potts, MD for primary care. I spoke with  [name] by phone today.  What matters to the patients health and wellness today?  Managing blood sugar    Goals Addressed             This Visit's Progress    Manage Blood Sugar   On track    Care Coordination Goals: Patient will follow a carbohydrate modified diet and limit or eliminate simple carbohydrates and sugars Patient will eat 3 meals per day with 30 grams of carbohydrates and up to 2 snacks per day if needed with less than 15 grams of carbohydrates Patient will continue to monitor glucose with Dexcom at least twice a day and as needed Patient will take meter to office visits for review Patient will keep follow-up appointment with endocrinologist on 09/21/23 Patient will call endocrinologist with any blood sugar readings less than 70 or with any readings greater than 300     Manage Cardiac Conditions (Afib, CHF, Recent MI)   On track    Care Coordination Goals: Patient will keep all medical appointments Echocardiogram 06/26/23, cardiologist 08/01/23, endocrinologist 09/21/23 Patient will notify provider of any new or worsening symptoms Patient will continue to check and record weight each morning after urinating and will reach out to cardiologist with any weight gain of more than 2 lbs overnight or more than 5 lbs in one week Patient will follow a low sodium heart healthy diet  Less than 2,000mg  of sodium per day < 64 oz of fluids per day Patient will call RN Care Coordinator [phone] with any resource or care coordination needs         SDOH assessments and interventions completed:  Yes  SDOH Interventions Today    Flowsheet Row Most Recent Value  SDOH Interventions   Transportation Interventions Intervention Not Indicated  Financial  Strain Interventions Other (Comment)  [Eliquis is expensive but does not qualify for assistance. Will be out of the medicare coverage gap in January and cost will come down. No other financial concerns.]  Health Literacy Interventions Intervention Not Indicated        Care Coordination Interventions:  Yes, provided  Interventions Today    Flowsheet Row Most Recent Value  Chronic Disease   Chronic disease during today's visit Diabetes, Congestive Heart Failure (CHF), Chronic Kidney Disease/End Stage Renal Disease (ESRD)  General Interventions   General Interventions Discussed/Reviewed General Interventions Discussed, General Interventions Reviewed, Annual Eye Exam, Labs, Annual Foot Exam, Durable Medical Equipment (DME), Doctor Visits, Vaccines  Labs Hgb A1c every 3 months  Vaccines COVID-19, Flu, Pneumonia  [encouraged]  Doctor Visits Discussed/Reviewed Doctor Visits Discussed, Doctor Visits Reviewed, PCP, Specialist  Durable Medical Equipment (DME) Glucomoter, Other  [Glucose averages 110-115. Scales: Weight stable at 174 lbs]  PCP/Specialist Visits Compliance with follow-up visit  [Echocardiogram 06/26/23, cardiologist 08/01/23, endocrinologist 09/21/23. Opthalmologist every 6 to 7 weeks for eye injections]  Exercise Interventions   Exercise Discussed/Reviewed Exercise Discussed, Exercise Reviewed, Physical Activity  Physical Activity Discussed/Reviewed Physical Activity Discussed, Physical Activity Reviewed  [encouraged increased phyiscal activity as tolerated with an ultimate goal of at least 150 minutes per week]  Education Interventions   Education Provided Provided Education  Provided Verbal Education On Nutrition, Foot Care, Eye Care, Labs, Blood Sugar Monitoring, When to see the  doctor, Medication, Exercise, Other  [daily weights]  Labs Reviewed Hgb A1c  [05/22/23 A1C 7.1%]  Mental Health Interventions   Mental Health Discussed/Reviewed Mental Health Discussed, Mental Health Reviewed   [No mental health complaints at this time]  Nutrition Interventions   Nutrition Discussed/Reviewed Nutrition Discussed, Nutrition Reviewed, Carbohydrate meal planning, Portion sizes, Adding fruits and vegetables, Increasing proteins, Fluid intake, Decreasing sugar intake, Decreasing salt  [eat 3 meals per day with 30 GM of CHO and up to 2 snacks per day, if needed, with less than 15 GM of CHO. Low sodium diet. Limit to 2,300 mg of sodium per day.]  Pharmacy Interventions   Pharmacy Dicussed/Reviewed Pharmacy Topics Discussed, Pharmacy Topics Reviewed, Medications and their functions  [Taking medications as prescribed. No concerns at this time.]  Safety Interventions   Safety Discussed/Reviewed Safety Discussed, Safety Reviewed, Fall Risk       Follow up plan: Follow up call scheduled for 08/07/23    Encounter Outcome:  Patient Visit Completed   [name], RN, BSN Care Management Coordinator [facility]  [facility] Direct Dial: [phone] Main #: [phone]

## 2023-06-26 ENCOUNTER — Ambulatory Visit: Payer: Medicare Other | Admitting: Podiatry

## 2023-06-26 ENCOUNTER — Ambulatory Visit: Payer: Medicare Other | Attending: Nurse Practitioner

## 2023-06-26 DIAGNOSIS — E1169 Type 2 diabetes mellitus with other specified complication: Secondary | ICD-10-CM | POA: Diagnosis not present

## 2023-06-26 DIAGNOSIS — I10 Essential (primary) hypertension: Secondary | ICD-10-CM | POA: Diagnosis not present

## 2023-06-26 DIAGNOSIS — I5022 Chronic systolic (congestive) heart failure: Secondary | ICD-10-CM | POA: Diagnosis present

## 2023-06-27 LAB — ECHOCARDIOGRAM COMPLETE
AR max vel: 1.86 cm2
AV Area VTI: 1.8 cm2
AV Area mean vel: 1.7 cm2
AV Mean grad: 5 mm[Hg]
AV Peak grad: 10 mm[Hg]
Ao pk vel: 1.58 m/s
Calc EF: 47.3 %
MV VTI: 1.81 cm2
S' Lateral: 4.3 cm
Single Plane A2C EF: 23.8 %
Single Plane A4C EF: 60.4 %

## 2023-06-29 ENCOUNTER — Other Ambulatory Visit: Payer: Medicare Other

## 2023-07-06 DIAGNOSIS — I10 Essential (primary) hypertension: Secondary | ICD-10-CM | POA: Diagnosis not present

## 2023-07-06 DIAGNOSIS — I4891 Unspecified atrial fibrillation: Secondary | ICD-10-CM | POA: Diagnosis not present

## 2023-07-06 DIAGNOSIS — I509 Heart failure, unspecified: Secondary | ICD-10-CM | POA: Diagnosis not present

## 2023-07-06 DIAGNOSIS — Z23 Encounter for immunization: Secondary | ICD-10-CM | POA: Diagnosis not present

## 2023-07-06 DIAGNOSIS — E7801 Familial hypercholesterolemia: Secondary | ICD-10-CM | POA: Diagnosis not present

## 2023-07-06 DIAGNOSIS — E1169 Type 2 diabetes mellitus with other specified complication: Secondary | ICD-10-CM | POA: Diagnosis not present

## 2023-07-06 DIAGNOSIS — I2510 Atherosclerotic heart disease of native coronary artery without angina pectoris: Secondary | ICD-10-CM | POA: Diagnosis not present

## 2023-07-06 DIAGNOSIS — E1165 Type 2 diabetes mellitus with hyperglycemia: Secondary | ICD-10-CM | POA: Diagnosis not present

## 2023-07-06 DIAGNOSIS — E118 Type 2 diabetes mellitus with unspecified complications: Secondary | ICD-10-CM | POA: Diagnosis not present

## 2023-07-06 DIAGNOSIS — I739 Peripheral vascular disease, unspecified: Secondary | ICD-10-CM | POA: Diagnosis not present

## 2023-07-06 DIAGNOSIS — Z6826 Body mass index (BMI) 26.0-26.9, adult: Secondary | ICD-10-CM | POA: Diagnosis not present

## 2023-08-01 ENCOUNTER — Ambulatory Visit: Payer: Medicare Other | Attending: Nurse Practitioner | Admitting: Nurse Practitioner

## 2023-08-01 ENCOUNTER — Encounter: Payer: Self-pay | Admitting: Nurse Practitioner

## 2023-08-01 VITALS — BP 110/60 | HR 71 | Ht 69.0 in | Wt 177.0 lb

## 2023-08-01 DIAGNOSIS — I471 Supraventricular tachycardia, unspecified: Secondary | ICD-10-CM | POA: Diagnosis not present

## 2023-08-01 DIAGNOSIS — I4892 Unspecified atrial flutter: Secondary | ICD-10-CM | POA: Diagnosis not present

## 2023-08-01 DIAGNOSIS — I480 Paroxysmal atrial fibrillation: Secondary | ICD-10-CM | POA: Insufficient documentation

## 2023-08-01 DIAGNOSIS — E785 Hyperlipidemia, unspecified: Secondary | ICD-10-CM | POA: Diagnosis not present

## 2023-08-01 DIAGNOSIS — I5032 Chronic diastolic (congestive) heart failure: Secondary | ICD-10-CM | POA: Insufficient documentation

## 2023-08-01 DIAGNOSIS — I739 Peripheral vascular disease, unspecified: Secondary | ICD-10-CM | POA: Insufficient documentation

## 2023-08-01 DIAGNOSIS — N1831 Chronic kidney disease, stage 3a: Secondary | ICD-10-CM | POA: Insufficient documentation

## 2023-08-01 DIAGNOSIS — I2510 Atherosclerotic heart disease of native coronary artery without angina pectoris: Secondary | ICD-10-CM | POA: Diagnosis not present

## 2023-08-01 NOTE — Patient Instructions (Addendum)

## 2023-08-01 NOTE — Progress Notes (Unsigned)
Cardiology Office Note:  .   Date:  03/31/2023 ID:  [name], DOB 10/18/1950, MRN [id number] PCP: Donetta Potts, MD  [facility] Providers Cardiologist:  Rollene Rotunda, MD PV Cardiologist:  Lorine Bears, MD {  History of Present Illness: .   [name] is a very pleasant 72 y.o. male with a PMH of CAD, s/p PCI in 2001, s/p CABG x 5 (LIMA - diagnonal, sequential SVG - RI and OM1, sequential SVT - PDA and PK in 2003), HFmrEF, PAF/A-flutter/SVT, HTN, HLD, PAD, T2DM, CKD, GERD, and OSA. Closely has been followed by Dr. Antoine Poche and Dr. Kirke Corin.   Admitted 2022 for syncope. Cardiac cath revealed 3/5 patent grafts. Cardiac monitor revealed A-fib with HR with rates as low as 30's while sleeping predominantly.   Admitted 11/2022 with volume overload. TTE EF 50-55%, wall motion abnormality. Did have intermittent A-fib overall rate controlled, was resumed on Elqius. 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 Renal artery duplex showed 1-59% left RAS, patent left renal artery, right side had abnormal right resistive index but no RAS.   Recently presented to [facility] for SOB, found to be A-fib in 110's. Given Metoprolol, converted to NSR. BNP 589, given IB Lasix. EKG was read as possible ectopic Atach, though suspect was A-flutter 2:1, 120 bpm. Due to trop elevation, was transferred to [facility]. TTE EF 45-50%. Underwent cardiac cath that found severe native CAD with 3/5 grafts patent - see report below, no acute lesions found. Was started on Entresto for ICM.   Today he presents for follow-up with his wife. Doing very well. Denies any chest pain, shortness of breath, palpitations, syncope, presyncope, dizziness, orthopnea, PND, swelling or significant weight changes, acute bleeding, or claudication. Says right femoral access site from cardiac cath is healing well. Did admit to issues with cough, recently was started on Pepcid at [facility] office and has  felt much better with improvement in symptoms.   Studies Reviewed: Marland Kitchen    LHC 02/2023:    --------------NATIVE VESSELS--------------   Prox RCA to Mid RCA lesion is 60% stenosed. Mid RCA to Dist RCA lesion is 90% stenosed. Dist RCA lesion is 100% stenosed with 100% stenosed side branch in RPAV. 2nd RPL lesion is 100% stenosed. RPDA lesion is 100% stenosed. - STABLE   Prox LAD to Mid LAD is 100% stenosed at proximal edge of prior stenT / SP1 - STABLE   Previously grafted ramus lesion is 100% stenosed. STABLE   2nd Mrg SUPERIOR branch is 80% stenosed. (Prieviously grafted)   --------------GRAFTS-------------------   Seq SVG- -RI-OM1 graft was visualized by angiography.  The graft exhibits severe diffuse disease. Origin to Prox Graft lesion is 100% stenosed.   Seq SVG- RPDA-RPL graft was visualized by angiography and is normal in caliber.  Prox Graft lesion before RPDA is 40% stenosed.   LIMA-LAD(Diag) graft was visualized by angiography and is normal in caliber, fills Diag branch only.  Mid LAD to Dist LAD lesion is 100% stenosed.- beyond LIMA-LAD insertion   ------------HEMODYNAMICS------------------   LV end diastolic pressure is mildly elevated.   There is no aortic valve stenosis.   -----------------RECOMMENDATIONS--------------------   Recommend to resume Apixaban, at currently prescribed dose and frequency on 03/07/2023.   Recommend concurrent antiplatelet therapy of Aspirin 81 mg daily for LONG-TERM.   Severe Native CAD with 3/5 grafts patent (except LIMA only fills a Diag branch & not the distal LAD that is atretic & occluded). Native  Vessels LAD 100% CTO af 1st Sept (prox edge of stent) RI 100 % CTO, superior branch of OM1 99%, native Cx - mild diffuse disease Diffuse mid-distal RCA ~70-80% irregular diseae before 100% CTO in distal vessel prior to bifurcation.  Grafts: Known occlusion of SeqSVG-RI-OM1 Patent LIMA-LAD/Diag with atretic/100% LAD Patent Seq SVG-RPDA-RPL1 (wiht very  small RPDA & moderate RPL & no retrograde flow to bifurcation LVEDP 18 mmHg  No acute lesion found     RECOMMENDATIONS Continue GDMT for ICM/CHF with A-fib.  Suspected troponin elevation is related to demand ischemia. Restart Eliquis in the morning, will start heparin 8 hours after Mynx closure.   Results communicated to Dr. Rennis Golden  Echo 02/2023: 1. Global mild LV hypokinesis with focal severe apical hypokinesis. Left  ventricular ejection fraction, by estimation, is 45 to 50%. The left  ventricle has mildly decreased function. The left ventricle demonstrates  global hypokinesis. The left  ventricular internal cavity size was moderately dilated. Left ventricular  diastolic function could not be evaluated.   2. Right ventricular systolic function is mildly reduced. The right  ventricular size is moderately enlarged. There is mildly elevated  pulmonary artery systolic pressure.   3. Left atrial size was mildly dilated.   4. Right atrial size was mildly dilated.   5. The mitral valve is normal in structure. Trivial mitral valve  regurgitation. No evidence of mitral stenosis.   6. The aortic valve is grossly normal. There is mild calcification of the  aortic valve. There is mild thickening of the aortic valve. Aortic valve  regurgitation is not visualized. No aortic stenosis is present.   7. The inferior vena cava is normal in size with <50% respiratory  variability, suggesting right atrial pressure of 8 mmHg.   Comparison(s): No significant change from prior study.  Vascular US renal artery bilateral 11/2022:  Right: No evidence of right renal artery stenosis. Abnormal right         Resistive Index. Normal size right kidney.  Left:  1-59% stenosis of the left renal artery. Abnormal left         Resisitve Index. Normal size of left kidney. Patent LRA with         mildly elevated velocities without significant stenosis.  Abdominal aortogram: 1.  Subtotally occluded left renal artery  with atrophy of left kidney. 2.  No significant aortoiliac disease. 3.  Right lower extremity: Calcified vessels with mild nonobstructive disease affecting the SFA and popliteal arteries, two-vessel runoff below the knee via the posterior tibial and peroneal artery.  Anterior tibial artery occluded distally and the dorsalis pedis fills retrograde from the pedal arch.   Recommendations: The patient has inline flow to the right toes via the posterior tibial artery.  No indication for revascularization.  Continue medical therapy. Resume Eliquis tomorrow as long as no bleeding issues. 50 mL of contrast was used for the procedure.  The patient did develop shortness of breath and hypoxia with IV fluid hydration and was given 1 dose of IV furosemide before the procedure.  He can be discharged home as long as he is not requiring any supplemental oxygen. Will plan on obtaining renal artery duplex as an outpatient to evaluate left kidney size and flow to make decision regarding left renal artery revascularization  Cardiac monitor 02/2021:  Patch Wear Time: 13 days and 23 hours (2022-05-13T12:17:15-0400 to 2022-05-27T11:22:43-0400) Atrial Fibrillation occurred continuously (100% burden), ranging from 28-123 bpm (avg of 60 bpm). 3 Pauses occurred, the longest lasting 4.7  secs (13 bpm). Isolated VEs were rare (<1.0%, 8592), VE Couplets were rare (<1.0%, 115), and VE Triplets were rare (<1.0%, 1). MD notification criteria for Slow Atrial Fibrillation met - Notified Melinda P LPN on 1/91/4782 at 9:20 AM EDT (KT)   Atrial fib and flutter slow ventricular rate  Frequent ventricular pauses with the longest pause being 4.7 seconds Rare ventricular ectopy with one triplet  Right/Left heart cath 01/2021:  Ost LAD to Prox LAD lesion is 100% stenosed. Ramus lesion is 100% stenosed. 2nd Mrg lesion is 80% stenosed. Prox RCA to Mid RCA lesion is 60% stenosed. Mid RCA to Dist RCA lesion is 90% stenosed. Origin to Mid  Graft lesion before Ramus is 100% stenosed. RPDA lesion is 100% stenosed. 2nd RPL lesion is 100% stenosed.  Risk Assessment/Calculations:    CHA2DS2-VASc Score = 5  This indicates a 7.2% annual risk of stroke. The patient's score is based upon: CHF History: 1 HTN History: 1 Diabetes History: 1 Stroke History: 0 Vascular Disease History: 1 Age Score: 1 Gender Score: 0  Physical Exam:   VS:  BP 110/60   Pulse 71   Ht 5\' 9"  (1.753 m)   Wt 177 lb (80.3 kg)   SpO2 98%   BMI 26.14 kg/m    Wt Readings from Last 3 Encounters:  08/01/23 177 lb (80.3 kg)  05/22/23 176 lb (79.8 kg)  03/31/23 178 lb (80.7 kg)    GEN: Well nourished, well developed in no acute distress NECK: No JVD; No carotid bruits CARDIAC: S1/S2, RRR, no murmurs, rubs, gallops RESPIRATORY:  Clear to auscultation without rales, wheezing or rhonchi  ABDOMEN: Soft, non-tender, non-distended EXTREMITIES:  No edema; No deformity   ASSESSMENT AND PLAN: .    CAD, s/p CABG, hx of NSTEMI Transferred from [facility] to [facility] to troponin elevation LHC revealed no acute lesions noted on cath, recommended medical management. Was felt that troponin elevation was d/t demand ischemia. Denies any chest pain. Continue Aspirin, Lopressor, Crestor, and NTG PRN. Heart healthy diet and regular cardiovascular exercise encouraged.     2. PAF/A-flutter, SVT Known hx of Aflutter/A-fib. Denies any recent tachycardia or palpitations. HR well controlled today. Continue Lopressor 12.5 mg BID. Continue Eliquis 5 mg BID for CHA2DS2-VASc score of 5. He is on appropriate dosing, denies any bleeding issues.   3. HFmrEF, Ischemic Cardiomyopathy Stage C, NYHA class I symptoms. TTE 02/2023 admission revealed EF 45-50%. Euvolemic and well compensated on exam. Continue Farxiga, Lasix, Lopressor, and Entresto. Just had labs obtained with PCP - will request BMET.  Low sodium diet, fluid restriction <2L, and daily weights encouraged. Educated to contact our office  for weight gain of 2 lbs overnight or 5 lbs in one week. Will update Echo in 3 months to evaluate EF.   4. PAD, HLD Denies any claudication symptoms. LDL 84 11/2022. Continue Crestor 10 mg daily. Heart healthy diet and regular cardiovascular exercise encouraged. Continue to follow-up with Dr. Kirke Corin.   5. CKD stage 3a Most recent sCr obtained was 1.12 with eGFR > 60. Will request BMET recently obtained with PCP. Avoid nephrotoxic agents. No medication changes at this time. Continue to follow with PCP.  Dispo: Follow-up with me or APP in 4 months or sooner if anything changes.   Signed, Sharlene Dory, NP

## 2023-08-03 DIAGNOSIS — E113313 Type 2 diabetes mellitus with moderate nonproliferative diabetic retinopathy with macular edema, bilateral: Secondary | ICD-10-CM | POA: Diagnosis not present

## 2023-08-07 ENCOUNTER — Telehealth: Payer: Self-pay | Admitting: *Deleted

## 2023-08-07 ENCOUNTER — Encounter: Payer: Self-pay | Admitting: *Deleted

## 2023-08-07 NOTE — Progress Notes (Signed)
  Care Coordination Note  08/07/2023 Name: PANKAJ HOUCHEN MRN: [id number] DOB: 01-26-1951  AVEDIS CALLO is a 72 y.o. year old male who is a primary care patient of Donetta Potts, MD and is actively engaged with the care management team. I reached out to [name] by phone today to assist with re-scheduling a follow up visit with the RN Case Manager  Follow up plan: Unsuccessful telephone outreach attempt made. A HIPAA compliant phone message was left for the patient providing contact information and requesting a return call.   [facility]  Care Coordination Care Guide  Direct Dial: [phone]'

## 2023-08-07 NOTE — Progress Notes (Signed)
  Care Coordination Note  08/07/2023 Name: YERIEL RITTEL MRN: [id number] DOB: 1951-02-20  REN GLADYS is a 72 y.o. year old male who is a primary care patient of Donetta Potts, MD and is actively engaged with the care management team. [name] called by phone today to assist with re-scheduling a follow up visit with the RN Case Manager  Follow up plan: Telephone appointment with care management team member scheduled for:11/27  [facility] Coordination Care Guide  Direct Dial: [phone]

## 2023-08-09 ENCOUNTER — Encounter: Payer: Self-pay | Admitting: *Deleted

## 2023-08-09 ENCOUNTER — Ambulatory Visit: Payer: Self-pay | Admitting: *Deleted

## 2023-08-11 DIAGNOSIS — E1169 Type 2 diabetes mellitus with other specified complication: Secondary | ICD-10-CM | POA: Diagnosis not present

## 2023-08-11 DIAGNOSIS — I4891 Unspecified atrial fibrillation: Secondary | ICD-10-CM | POA: Diagnosis not present

## 2023-08-11 DIAGNOSIS — I2510 Atherosclerotic heart disease of native coronary artery without angina pectoris: Secondary | ICD-10-CM | POA: Diagnosis not present

## 2023-08-11 DIAGNOSIS — I10 Essential (primary) hypertension: Secondary | ICD-10-CM | POA: Diagnosis not present

## 2023-08-14 NOTE — Patient Outreach (Signed)
  Care Coordination   Follow Up Visit Note   08/09/2023 Name: [name] MRN: [id number] DOB: [birth year]  [name] is a 72 y.o. year old male who sees Donetta Potts, MD for primary care. I spoke with  [name] by phone today.  What matters to the patients health and wellness today?  Ongoing management of blood sugar and cardiac conditions    Goals Addressed             This Visit's Progress    COMPLETED: Manage Blood Sugar   On track    Care Coordination Goals: Patient will keep follow-up appointment with endocrinologist on 09/21/23 Patient will continue to work with Steve Rattler, PharmD with [facility] and will reach out to them with any care management needs.   Patient does not have any acute or urgent needs related to this goal and will follow-up with PCP regarding management.      COMPLETED: Manage Cardiac Conditions (Afib, CHF, Recent MI)   On track    Care Coordination Goals: Patient will keep all medical appointments Patient will notify provider of any new or worsening symptoms Patient will continue to work with Steve Rattler, PharmD with [facility] and will reach out to them with any care management needs.   Patient does not have any acute or urgent needs related to this goal and will follow-up with PCP regarding management.           SDOH assessments and interventions completed:  No   Care Coordination Interventions:  Yes, provided  Interventions Today    Flowsheet Row Most Recent Value  Chronic Disease   Chronic disease during today's visit Diabetes, Atrial Fibrillation (AFib)  General Interventions   General Interventions Discussed/Reviewed General Interventions Discussed, General Interventions Reviewed, Annual Eye Exam, Labs, Durable Medical Equipment (DME), Doctor Visits  Labs Hgb A1c every 3 months  Doctor Visits Discussed/Reviewed Doctor Visits Discussed, Doctor Visits Reviewed, PCP, Annual Wellness  Visits, Specialist  Durable Medical Equipment (DME) Other  [Scales. Dexcom G7.]  PCP/Specialist Visits Compliance with follow-up visit  [PCP in February 2024, Endocrinologist on 10/26/23, cardiologist on 01/29/24]  Exercise Interventions   Exercise Discussed/Reviewed Physical Activity, Exercise Discussed, Exercise Reviewed, Weight Managment  Physical Activity Discussed/Reviewed Physical Activity Discussed, Physical Activity Reviewed  [encouraged continued physical activity with a goal of at least 150 minutes per week]  Weight Management Weight loss  [Patient is working towards weight loss goals and has successfully lost 25-35 lbs]  Education Interventions   Education Provided Provided Education  Provided Verbal Education On Nutrition, Blood Sugar Monitoring, Labs, Eye Care, Foot Care, Medication, Exercise, When to see the doctor  Labs Reviewed Hgb A1c  [05/22/23 A1C 7.1%]  Nutrition Interventions   Nutrition Discussed/Reviewed Nutrition Discussed, Nutrition Reviewed, Carbohydrate meal planning, Adding fruits and vegetables, Portion sizes, Decreasing sugar intake, Fluid intake, Increasing proteins  Pharmacy Interventions   Pharmacy Dicussed/Reviewed Pharmacy Topics Discussed, Pharmacy Topics Reviewed, Medications and their functions  Safety Interventions   Safety Discussed/Reviewed Safety Discussed, Safety Reviewed, Fall Risk       Follow up plan: No further intervention required. Patient is established with Steve Rattler, PharmD, who is part of the Care Management Team at [facility]. He will continue to follow-up with them regarding care management needs.  Encounter Outcome:  Patient Visit Completed   [name], RN, BSN Care Management Coordinator [facility]  [facility] Direct Dial: [phone] Main #: [phone]

## 2023-08-21 DIAGNOSIS — E1140 Type 2 diabetes mellitus with diabetic neuropathy, unspecified: Secondary | ICD-10-CM | POA: Diagnosis not present

## 2023-08-21 DIAGNOSIS — L609 Nail disorder, unspecified: Secondary | ICD-10-CM | POA: Diagnosis not present

## 2023-08-27 ENCOUNTER — Other Ambulatory Visit: Payer: Self-pay | Admitting: Cardiovascular Disease

## 2023-08-28 NOTE — Telephone Encounter (Signed)
Prescription refill request for Eliquis received. Indication:afib Last office visit:11/24 Scr:1.12  6/24 Age: 72 Weight:80.3  kg  Prescription refilled

## 2023-09-21 ENCOUNTER — Ambulatory Visit: Payer: Medicare Other | Admitting: Nurse Practitioner

## 2023-09-21 DIAGNOSIS — E113313 Type 2 diabetes mellitus with moderate nonproliferative diabetic retinopathy with macular edema, bilateral: Secondary | ICD-10-CM | POA: Diagnosis not present

## 2023-09-21 DIAGNOSIS — H43823 Vitreomacular adhesion, bilateral: Secondary | ICD-10-CM | POA: Diagnosis not present

## 2023-09-21 DIAGNOSIS — H35033 Hypertensive retinopathy, bilateral: Secondary | ICD-10-CM | POA: Diagnosis not present

## 2023-09-21 DIAGNOSIS — H3582 Retinal ischemia: Secondary | ICD-10-CM | POA: Diagnosis not present

## 2023-10-12 DIAGNOSIS — I482 Chronic atrial fibrillation, unspecified: Secondary | ICD-10-CM | POA: Diagnosis not present

## 2023-10-12 DIAGNOSIS — I10 Essential (primary) hypertension: Secondary | ICD-10-CM | POA: Diagnosis not present

## 2023-10-12 DIAGNOSIS — Z6826 Body mass index (BMI) 26.0-26.9, adult: Secondary | ICD-10-CM | POA: Diagnosis not present

## 2023-10-12 DIAGNOSIS — I509 Heart failure, unspecified: Secondary | ICD-10-CM | POA: Diagnosis not present

## 2023-10-12 DIAGNOSIS — E1169 Type 2 diabetes mellitus with other specified complication: Secondary | ICD-10-CM | POA: Diagnosis not present

## 2023-10-26 ENCOUNTER — Encounter: Payer: Self-pay | Admitting: Nurse Practitioner

## 2023-10-26 ENCOUNTER — Ambulatory Visit (INDEPENDENT_AMBULATORY_CARE_PROVIDER_SITE_OTHER): Payer: Medicare Other | Admitting: Nurse Practitioner

## 2023-10-26 VITALS — BP 122/60 | HR 58 | Ht 69.0 in | Wt 180.4 lb

## 2023-10-26 DIAGNOSIS — Z7985 Long-term (current) use of injectable non-insulin antidiabetic drugs: Secondary | ICD-10-CM

## 2023-10-26 DIAGNOSIS — E1165 Type 2 diabetes mellitus with hyperglycemia: Secondary | ICD-10-CM | POA: Diagnosis not present

## 2023-10-26 DIAGNOSIS — Z7984 Long term (current) use of oral hypoglycemic drugs: Secondary | ICD-10-CM | POA: Diagnosis not present

## 2023-10-26 LAB — POCT GLYCOSYLATED HEMOGLOBIN (HGB A1C): Hemoglobin A1C: 7 % — AB (ref 4.0–5.6)

## 2023-10-26 MED ORDER — TRULICITY 4.5 MG/0.5ML ~~LOC~~ SOAJ: 4.5000 mg | SUBCUTANEOUS | 3 refills | Status: AC

## 2023-10-26 MED ORDER — DAPAGLIFLOZIN PROPANEDIOL 10 MG PO TABS: 10.0000 mg | ORAL_TABLET | Freq: Every day | ORAL | 3 refills | Status: AC

## 2023-10-26 MED ORDER — METFORMIN HCL ER 500 MG PO TB24: 500.0000 mg | ORAL_TABLET | Freq: Every day | ORAL | 3 refills | Status: DC

## 2023-10-26 NOTE — Progress Notes (Signed)
Endocrinology Follow Up Note       10/26/2023, 9:01 AM   Subjective:    Patient ID: [name], male    DOB: 1951/05/14.  [name] is being seen in follow up after being seen in consultation for management of currently uncontrolled symptomatic diabetes requested by  Donetta Potts, MD.   Past Medical History:  Diagnosis Date   A-fib [facility])    CAD (coronary artery disease)    Hypertension    Obstructive sleep apnea    CPAP   Rosacea    Type 2 diabetes mellitus with hyperglycemia [facility])     Past Surgical History:  Procedure Laterality Date   ABDOMINAL AORTOGRAM W/LOWER EXTREMITY Right 11/30/2022   Procedure: ABDOMINAL AORTOGRAM W/LOWER EXTREMITY;  Surgeon: Iran Ouch, MD;  Location: [facility] INVASIVE CV LAB;  Service: Cardiovascular;  Laterality: Right;   CORONARY ARTERY BYPASS GRAFT     LIMA to the LAD, sequential SVG to ramus intermediate and OM1, sequential SVG to PDA and posterior lateral   LEFT HEART CATH AND CORS/GRAFTS ANGIOGRAPHY N/A 03/06/2023   Procedure: LEFT HEART CATH AND CORS/GRAFTS ANGIOGRAPHY;  Surgeon: Marykay Lex, MD;  Location: [facility] INVASIVE CV LAB;  Service: Cardiovascular;  Laterality: N/A;   RIGHT/LEFT HEART CATH AND CORONARY/GRAFT ANGIOGRAPHY N/A 01/21/2021   Procedure: RIGHT/LEFT HEART CATH AND CORONARY/GRAFT ANGIOGRAPHY;  Surgeon: Runell Gess, MD;  Location: [facility] INVASIVE CV LAB;  Service: Cardiovascular;  Laterality: N/A;    Social History   Socioeconomic History   Marital status: Married    Spouse name: Production designer, theatre/television/film   Number of children: 3   Years of education: Not on file   Highest education level: Not on file  Occupational History   Not on file  Tobacco Use   Smoking status: Never   Smokeless tobacco: Current    Types: Snuff   Tobacco comments:    Uses Snus   Vaping Use   Vaping status: Never Used  Substance and Sexual Activity   Alcohol use: Yes     Comment: 1-2 beers a day   Drug use: Never   Sexual activity: Not on file  Other Topics Concern   Not on file  Social History Narrative   Lives with wife and two grandchildren.     Lives in a one story home    Right Handed    Drinks little caffeine    Social Drivers of Health   Financial Resource Strain: Medium Risk (06/21/2023)   Overall Financial Resource Strain (CARDIA)    Difficulty of Paying Living Expenses: Somewhat hard  Food Insecurity: No Food Insecurity (03/03/2023)   Hunger Vital Sign    Worried About Running Out of Food in the Last Year: Never true    Ran Out of Food in the Last Year: Never true  Transportation Needs: No Transportation Needs (06/21/2023)   PRAPARE - Administrator, Civil Service (Medical): No    Lack of Transportation (Non-Medical): No  Physical Activity: Inactive (03/20/2023)   Exercise Vital Sign    Days of Exercise per Week: 0 days    Minutes of Exercise per Session: 0 min  Stress: Not on file  Social Connections: Not on file    Family History  Problem Relation Age of Onset   Heart disease Mother 53   Diabetes Mother    Breast cancer Mother    Heart attack Mother    Brain cancer Mother    Heart disease Father    Kidney cancer Father        died from cancer   Heart attack Father 32   Cancer - Other Father        Spinal Cancer   Migraines Sister    Fibromyalgia Sister    Heart Problems Sister        Heart problems after birth of child   CAD Brother 66       CABG   Diabetes Brother     Outpatient Encounter Medications as of 10/26/2023  Medication Sig   amLODipine (NORVASC) 10 MG tablet Take 10 mg by mouth daily.   aspirin EC 81 MG tablet Take 1 tablet (81 mg total) by mouth daily. Swallow whole.   ELIQUIS 5 MG TABS tablet TAKE 1 TABLET BY MOUTH TWICE A DAY   ergocalciferol (VITAMIN D2) 1.25 MG (50000 UT) capsule Take 50,000 Units by mouth once a week.   furosemide (LASIX) 20 MG tablet TAKE 1 TABLET BY MOUTH DAILY AS NEEDED  (WEIGHT GAIN OF 3-5LB OR INCREASED SHORTNESS OF BREATH). (Patient taking differently: Take 20 mg by mouth daily as needed for fluid.)   metoprolol tartrate (LOPRESSOR) 25 MG tablet Take 0.5 tablets (12.5 mg total) by mouth 2 (two) times daily.   nitroGLYCERIN (NITROSTAT) 0.4 MG SL tablet Place 1 tablet (0.4 mg total) under the tongue every 5 (five) minutes x 3 doses as needed for chest pain.   rosuvastatin (CRESTOR) 10 MG tablet Take 1 tablet (10 mg total) by mouth daily.   sacubitril-valsartan (ENTRESTO) 24-26 MG Take 1 tablet by mouth 2 (two) times daily.   [DISCONTINUED] dapagliflozin propanediol (FARXIGA) 10 MG TABS tablet Take 1 tablet (10 mg total) by mouth daily.   [DISCONTINUED] Dulaglutide (TRULICITY) 4.5 MG/0.5ML SOPN Inject 4.5 mg into the skin once a week.   [DISCONTINUED] glipiZIDE (GLUCOTROL) 5 MG tablet Take one tab by mouth every morning & 1/2 tab by mouth every evening   [DISCONTINUED] metFORMIN (GLUCOPHAGE-XR) 500 MG 24 hr tablet Take 1 tablet (500 mg total) by mouth daily with breakfast.   dapagliflozin propanediol (FARXIGA) 10 MG TABS tablet Take 1 tablet (10 mg total) by mouth daily.   Dulaglutide (TRULICITY) 4.5 MG/0.5ML SOAJ Inject 4.5 mg into the skin once a week.   metFORMIN (GLUCOPHAGE-XR) 500 MG 24 hr tablet Take 1 tablet (500 mg total) by mouth daily with breakfast.   No facility-administered encounter medications on file as of 10/26/2023.    ALLERGIES: No Known Allergies  VACCINATION STATUS: Immunization History  Administered Date(s) Administered   Td (Adult),5 Lf Tetanus Toxid, Preservative Free 07/19/2010    Diabetes He presents for his follow-up diabetic visit. He has type 2 diabetes mellitus. Onset time: diagnosed at approx age of 26. His disease course has been improving. There are no hypoglycemic associated symptoms. Associated symptoms include foot ulcerations and polyuria. Pertinent negatives for diabetes include no blurred vision. There are no  hypoglycemic complications. Symptoms are stable. Diabetic complications include heart disease (MI when he was 49), nephropathy, peripheral neuropathy, PVD and retinopathy. Risk factors for coronary artery disease include diabetes mellitus, dyslipidemia, family history, hypertension, male sex, obesity and sedentary lifestyle. Current diabetic treatment includes oral agent (triple therapy) (  and Trulicity). He is compliant with treatment most of the time. His weight is fluctuating minimally. He is following a generally healthy diet. When asked about meal planning, he reported none. He has not had a previous visit with a dietitian. He rarely participates in exercise. His home blood glucose trend is decreasing steadily. His overall blood glucose range is 140-180 mg/dl. (He presents today, accompanied by his wife, with his CGM showing tightening glycemic profile.  His POCT A1c today is 7%, improving from last visit of 7.1%.  Analysis of his CGM shows TIR 70%, TAR 29%, TBR <2% with a GMI of 7.1%.  He notes he still has some work to do with healthy breakfast options (where he tends to struggle the most).  He does get alarms for low readings on his CGM more often recently.) An ACE inhibitor/angiotensin II receptor blocker is not being taken. He sees a podiatrist.Eye exam is current.     Review of systems  Constitutional: + stable body weight, current Body mass index is 26.64 kg/m., no fatigue, no subjective hyperthermia, no subjective hypothermia Eyes: + blurry vision (hx retinopathy), no xerophthalmia ENT: no sore throat, no nodules palpated in throat, no dysphagia/odynophagia, no hoarseness Cardiovascular: no chest pain, no shortness of breath, + intermittent palpitations-hx afib, no leg swelling Respiratory: no cough, no shortness of breath Gastrointestinal: no nausea/vomiting/diarrhea Musculoskeletal: no muscle/joint aches, walks with cane for frequent falls Skin: no rashes, no hyperemia, + chronic DM foot  ulcer treated by [facility] Neurological: + chronic hand tremors with decreased dexterity, no numbness, no tingling, no dizziness Psychiatric: no depression, no anxiety  Objective:     BP 122/60 (BP Location: Left Arm, Patient Position: Sitting, Cuff Size: Large)   Pulse (!) 58   Ht 5\' 9"  (1.753 m)   Wt 180 lb 6.4 oz (81.8 kg)   BMI 26.64 kg/m   Wt Readings from Last 3 Encounters:  10/26/23 180 lb 6.4 oz (81.8 kg)  08/01/23 177 lb (80.3 kg)  05/22/23 176 lb (79.8 kg)     BP Readings from Last 3 Encounters:  10/26/23 122/60  08/01/23 110/60  05/22/23 129/66     Physical Exam- Limited  Constitutional:  Body mass index is 26.64 kg/m. , not in acute distress, normal state of mind Eyes:  EOMI, no exophthalmos Musculoskeletal: walks with cane Skin:  no rashes, no hyperemia    Diabetic Foot Exam - Simple   No data filed     CMP ( most recent) CMP     Component Value Date/Time   NA 134 (L) 03/06/2023 1318   NA 138 12/13/2022 0943   K 3.6 03/06/2023 1318   CL 104 03/06/2023 1318   CO2 20 (L) 03/06/2023 1318   GLUCOSE 109 (H) 03/06/2023 1318   BUN 22 03/06/2023 1318   BUN 14 12/13/2022 0943   CREATININE 1.12 03/06/2023 1318   CALCIUM 8.8 (L) 03/06/2023 1318   PROT 7.2 01/19/2021 2031   ALBUMIN 3.7 01/19/2021 2031   AST 17 01/19/2021 2031   ALT 19 01/19/2021 2031   ALKPHOS 91 01/19/2021 2031   BILITOT 1.2 01/19/2021 2031   GFRNONAA >60 03/06/2023 1318   GFRAA  07/24/2007 1130    >60        The eGFR has been calculated using the MDRD equation. This calculation has not been validated in all clinical     Diabetic Labs (most recent): Lab Results  Component Value Date   HGBA1C 7.0 (A) 10/26/2023  HGBA1C 7.1 (A) 05/22/2023   HGBA1C 7.5 (H) 03/04/2023   MICROALBUR 30 mg/L 05/22/2023     Lipid Panel ( most recent) Lipid Panel     Component Value Date/Time   CHOL 138 12/01/2022 0446   TRIG 74 12/01/2022 0446   HDL 39 (L) 12/01/2022 0446    CHOLHDL 3.5 12/01/2022 0446   VLDL 15 12/01/2022 0446   LDLCALC 84 12/01/2022 0446      Lab Results  Component Value Date   TSH 2.490 12/01/2022           Assessment & Plan:   1) Type 2 diabetes mellitus with hyperglycemia, without long-term current use of insulin (HCC)  He presents today, accompanied by his wife, with his CGM showing tightening glycemic profile.  His POCT A1c today is 7%, improving from last visit of 7.1%.  Analysis of his CGM shows TIR 70%, TAR 29%, TBR <2% with a GMI of 7.1%.  He notes he still has some work to do with healthy breakfast options (where he tends to struggle the most).  He does get alarms for low readings on his CGM more often recently.  - [name] has currently uncontrolled symptomatic type 2 DM since 73 years of age.   -Recent labs reviewed.  - I had a long discussion with him about the progressive nature of diabetes and the pathology behind its complications. -his diabetes is complicated by CAD, CHF, PAD, CKD stage 3a, DM foot ulcers and he remains at a high risk for more acute and chronic complications which include CAD, CVA, CKD, retinopathy, and neuropathy. These are all discussed in detail with him.  The following Lifestyle Medicine recommendations according to American College of Lifestyle Medicine [facility]) were discussed and offered to patient and he agrees to start the journey:  A. Whole Foods, Plant-based plate comprising of fruits and vegetables, plant-based proteins, whole-grain carbohydrates was discussed in detail with the patient.   A list for source of those nutrients were also provided to the patient.  Patient will use only water or unsweetened tea for hydration. B.  The need to stay away from risky substances including alcohol, smoking; obtaining 7 to 9 hours of restorative sleep, at least 150 minutes of moderate intensity exercise weekly, the importance of healthy social connections,  and stress reduction techniques were  discussed. C.  A full color page of  Calorie density of various food groups per pound showing examples of each food groups was provided to the patient.  - Nutritional counseling repeated at each appointment due to patients tendency to fall back in to old habits.  - The patient admits there is a room for improvement in their diet and drink choices. -  Suggestion is made for the patient to avoid simple carbohydrates from their diet including Cakes, Sweet Desserts / Pastries, Ice Cream, Soda (diet and regular), Sweet Tea, Candies, Chips, Cookies, Sweet Pastries, Store Bought Juices, Alcohol in Excess of 1-2 drinks a day, Artificial Sweeteners, Coffee Creamer, and "Sugar-free" Products. This will help patient to have stable blood glucose profile and potentially avoid unintended weight gain.   - I encouraged the patient to switch to unprocessed or minimally processed complex starch and increased protein intake (animal or plant source), fruits, and vegetables.   - Patient is advised to stick to a routine mealtimes to eat 3 meals a day and avoid unnecessary snacks (to snack only to correct hypoglycemia).  - I have approached him with the following individualized plan to  manage his diabetes and patient agrees:   -Given his overall improvement, no changes will be made to his medications today.  He is advised to continue Farxiga 10 mg po daily, Trulicity 4.5 mg SQ weekly, and Metformin 500 mg ER daily with breakfast.  I advised him to stop the Glipizide for now due to hypoglycemia.  -he is encouraged to start monitoring glucose 2 times daily (using his CGM), before breakfast and before bed, and to call the clinic if he has readings less than 70 or above 300 for 3 tests in a row. He was approved for Dexcom G7.  - Adjustment parameters are given to him for hypo and hyperglycemia in writing. - he is encouraged to call clinic for blood glucose levels less than 70 or above 300 mg /dl.  - Specific targets for   A1c; LDL, HDL, and Triglycerides were discussed with the patient.  2) Blood Pressure /Hypertension:  his blood pressure is controlled to target.   he is advised to continue his current medications including Norvasc 10 mg p.o. daily with breakfast and Lasix 20 mg po daily as needed for fluid.  3) Lipids/Hyperlipidemia:    Review of his recent lipid panel from 12/01/22 showed controlled LDL at 84 .  he is advised to continue Crestor 10 mg daily at bedtime.  Side effects and precautions discussed with him.  PCP recently did labs, will request copy for our records.  4)  Weight/Diet:  his Body mass index is 26.64 kg/m.  -   he is NOT a candidate for weight loss.  Exercise, and detailed carbohydrates information provided  -  detailed on discharge instructions.  5) Chronic Care/Health Maintenance: -he is not on ACEI/ARB and is on Statin medications and is encouraged to initiate and continue to follow up with Ophthalmology, Dentist, Podiatrist at least yearly or according to recommendations, and advised to stay away from smoking. I have recommended yearly flu vaccine and pneumonia vaccine at least every 5 years; moderate intensity exercise for up to 150 minutes weekly; and sleep for at least 7 hours a day.  - he is advised to maintain close follow up with Donetta Potts, MD for primary care needs, as well as his other providers for optimal and coordinated care.     I spent  36  minutes in the care of the patient today including review of labs from CMP, Lipids, Thyroid Function, Hematology (current and previous including abstractions from other facilities); face-to-face time discussing  his blood glucose readings/logs, discussing hypoglycemia and hyperglycemia episodes and symptoms, medications doses, his options of short and long term treatment based on the latest standards of care / guidelines;  discussion about incorporating lifestyle medicine;  and documenting the encounter. Risk reduction  counseling performed per USPSTF guidelines to reduce obesity and cardiovascular risk factors.     Please refer to Patient Instructions for Blood Glucose Monitoring and Insulin/Medications Dosing Guide"  in media tab for additional information. Please  also refer to " Patient Self Inventory" in the Media  tab for reviewed elements of pertinent patient history.  [name] participated in the discussions, expressed understanding, and voiced agreement with the above plans.  All questions were answered to his satisfaction. he is encouraged to contact clinic should he have any questions or concerns prior to his return visit.     Follow up plan: - Return in about 4 months (around 02/23/2024) for Diabetes F/U with A1c in office, No previsit labs.   Adiel Mcnamara  Lurlean Leyden [facility] [address] [place], [place] [address] Phone: [phone] Fax: [phone]  10/26/2023, 9:01 AM

## 2023-10-31 DIAGNOSIS — L609 Nail disorder, unspecified: Secondary | ICD-10-CM | POA: Diagnosis not present

## 2023-10-31 DIAGNOSIS — E1140 Type 2 diabetes mellitus with diabetic neuropathy, unspecified: Secondary | ICD-10-CM | POA: Diagnosis not present

## 2023-11-02 ENCOUNTER — Telehealth: Payer: Self-pay | Admitting: *Deleted

## 2023-11-02 NOTE — Telephone Encounter (Signed)
Patient was called and the issue was resolved.

## 2023-11-02 NOTE — Telephone Encounter (Signed)
Patient called and left a message that he would appreciate a call back , this is about a letter that he has received.

## 2023-11-09 DIAGNOSIS — E113313 Type 2 diabetes mellitus with moderate nonproliferative diabetic retinopathy with macular edema, bilateral: Secondary | ICD-10-CM | POA: Diagnosis not present

## 2023-11-10 DIAGNOSIS — I10 Essential (primary) hypertension: Secondary | ICD-10-CM | POA: Diagnosis not present

## 2023-11-10 DIAGNOSIS — E7801 Familial hypercholesterolemia: Secondary | ICD-10-CM | POA: Diagnosis not present

## 2023-11-10 DIAGNOSIS — I482 Chronic atrial fibrillation, unspecified: Secondary | ICD-10-CM | POA: Diagnosis not present

## 2023-11-10 DIAGNOSIS — E1169 Type 2 diabetes mellitus with other specified complication: Secondary | ICD-10-CM | POA: Diagnosis not present

## 2023-12-11 DIAGNOSIS — I482 Chronic atrial fibrillation, unspecified: Secondary | ICD-10-CM | POA: Diagnosis not present

## 2023-12-11 DIAGNOSIS — I10 Essential (primary) hypertension: Secondary | ICD-10-CM | POA: Diagnosis not present

## 2023-12-11 DIAGNOSIS — E1169 Type 2 diabetes mellitus with other specified complication: Secondary | ICD-10-CM | POA: Diagnosis not present

## 2023-12-11 DIAGNOSIS — E7800 Pure hypercholesterolemia, unspecified: Secondary | ICD-10-CM | POA: Diagnosis not present

## 2023-12-19 NOTE — Progress Notes (Unsigned)
 Cardiology Office Note:   Date:  12/20/2023  ID:  [name], DOB 11/16/1950, MRN [id number] PCP: Donetta Potts, MD  [facility] Providers Cardiologist:  Rollene Rotunda, MD PV Cardiologist:  Lorine Bears, MD {  History of Present Illness:   [name] is a 73 y.o. male  who for follow up of CAD.   He was hospitalized for syncope in 2022.  He had a cath as below.  3 out of 5 grafts were patent.    The sequential vein to the ramus branch and obtuse marginal branch was occluded at its origin.  Sequential vein to the PDA and PLA was widely patent as was the LIMA to the LAD.  He was managed medically.  Cath demonstrated an EF of 50%.  There was apical hypokinesis and mild LVH.  He had moderately elevated pulmonary pressures.  There was mild MR.   He wore a monitor and the atrial fib has a slow heart rate in the 30s.   He was admitted 11/2022 with volume overload. TTE EF 50-55%, wall motion abnormality. Did have intermittent A-fib overall rate controlled, was resumed on Elqius. 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 Renal artery duplex showed 1-59% left RAS, patent left renal artery, right side had abnormal right resistive index but no RAS.  In 2024 he was at [facility] with SOB and atrial fib.  Due to trop elevation, was transferred to [facility]. TTE EF 45-50%. Underwent cardiac cath that found severe native CAD with 3/5 grafts patent - see report below, no acute lesions found. Was started on Entresto for ICM.   He has done relatively well since I last saw him.  He walks with a cane because of some balance problem.  He does paddle a little stationary bicycle machine for exercise and he does not have any symptoms related to this.  He is not describing chest pressure, neck or arm discomfort.  He is not describing shortness of breath, PND orthopnea.  He is not having any palpitations, presyncope or syncope.  He was having episodes of  sometimes eating and starting to cough and getting short of breath.  He was put on his CPAP, take a Lasix.  This however has not happened in several months.  ROS: As stated in the HPI and negative for all other systems.  Studies Reviewed:    EKG:   EKG Interpretation Date/Time:  Wednesday December 20 2023 11:23:43 EDT Ventricular Rate:  65 PR Interval:  272 QRS Duration:  94 QT Interval:  410 QTC Calculation: 426 R Axis:   -79  Text Interpretation: Sinus rhythm with 1st degree A-V block Left anterior fascicular block Anterolateral infarct (cited on or before 01-Dec-2022) When compared with ECG of 04-Mar-2023 08:34, Nonspecific T wave abnormality now evident in Inferior leads T wave inversion less evident in Lateral leads Confirmed by Rollene Rotunda ([id number]) on 12/20/2023 11:58:58 AM     Risk Assessment/Calculations:       Physical Exam:   VS:  BP (!) 146/60   Pulse 65   Ht 5\' 9"  (1.753 m)   Wt 173 lb (78.5 kg)   BMI 25.55 kg/m    Wt Readings from Last 3 Encounters:  12/20/23 173 lb (78.5 kg)  10/26/23 180 lb 6.4 oz (81.8 kg)  08/01/23 177 lb (80.3 kg)     GEN: Well nourished, well developed in no acute distress NECK: No JVD; No carotid bruits CARDIAC: RRR,  soft apical systolic murmur radiating slightly at aortic outflow tract murmurs, rubs, gallops RESPIRATORY:  Clear to auscultation without rales, wheezing or rhonchi  ABDOMEN: Soft, non-tender, non-distended EXTREMITIES:  No edema; No deformity   ASSESSMENT AND PLAN:   CAD:   The patient has no new sypmtoms.  No further cardiovascular testing is indicated.  We will continue with aggressive risk reduction and meds as listed.   ATRIAL FIB:   He tolerates anticoagulation.  CHA2DS2-VASc is 5.  I am going to stop his aspirin.  He will continue the Eliquis. this.   HTN: The blood pressure is mildly elevated.  I would like to try to go up on his Entresto.  I given him written instructions to get a basic metabolic profile by his  primary provider.  He will go to 49/51 twice daily.   DYSLIPIDEMIA: Total cholesterol was 138 with an LDL of 84.  He is going to have this checked soon by his primary doctor and I have asked him to have the results sent and told him the goal of his LDL is 55.  We will also ask them to draw an LP(a).   DM: A1c was 7.0 which is down from 9.0.  No change in therapy.   PULMONARY HTN:    This was mild in the past and I will follow this clinically.  PVD: He is continuing with risk reduction.     Follow up with me in six months.    Signed, Rollene Rotunda, MD

## 2023-12-20 ENCOUNTER — Encounter: Payer: Self-pay | Admitting: Cardiology

## 2023-12-20 ENCOUNTER — Ambulatory Visit (INDEPENDENT_AMBULATORY_CARE_PROVIDER_SITE_OTHER): Payer: Medicare Other | Admitting: Cardiology

## 2023-12-20 VITALS — BP 146/60 | HR 65 | Ht 69.0 in | Wt 173.0 lb

## 2023-12-20 DIAGNOSIS — E118 Type 2 diabetes mellitus with unspecified complications: Secondary | ICD-10-CM

## 2023-12-20 DIAGNOSIS — I272 Pulmonary hypertension, unspecified: Secondary | ICD-10-CM | POA: Diagnosis not present

## 2023-12-20 DIAGNOSIS — I2510 Atherosclerotic heart disease of native coronary artery without angina pectoris: Secondary | ICD-10-CM

## 2023-12-20 DIAGNOSIS — I480 Paroxysmal atrial fibrillation: Secondary | ICD-10-CM | POA: Diagnosis not present

## 2023-12-20 DIAGNOSIS — I10 Essential (primary) hypertension: Secondary | ICD-10-CM

## 2023-12-20 MED ORDER — NITROGLYCERIN 0.4 MG SL SUBL: 0.4000 mg | SUBLINGUAL_TABLET | SUBLINGUAL | 4 refills | Status: AC | PRN

## 2023-12-20 MED ORDER — SACUBITRIL-VALSARTAN 49-51 MG PO TABS: 1.0000 | ORAL_TABLET | Freq: Two times a day (BID) | ORAL | 11 refills | Status: AC

## 2023-12-20 NOTE — Patient Instructions (Signed)
 Medication Instructions:  Please discontinue your Aspirin. Increase Entreseto to 49-51 mg tablets take 1 twice a day. Continue all other medications as listed.  *If you need a refill on your cardiac medications before your next appointment, please call your pharmacy*  Lab Work: Please ask your primary care doctor to add a LPa and BMP to your upcoming labs and send results to Dr Antoine Poche.  LDL goal 55.  Follow-Up: At [facility], you and your health needs are our priority.  As part of our continuing mission to provide you with exceptional heart care, our providers are all part of one team.  This team includes your primary Cardiologist (physician) and Advanced Practice Providers or APPs (Physician Assistants and Nurse Practitioners) who all work together to provide you with the care you need, when you need it.  Your next appointment:   6 month(s)  Provider:   Rollene Rotunda, MD    We recommend signing up for the patient portal called "MyChart".  Sign up information is provided on this After Visit Summary.  MyChart is used to connect with patients for Virtual Visits (Telemedicine).  Patients are able to view lab/test results, encounter notes, upcoming appointments, etc.  Non-urgent messages can be sent to your provider as well.   To learn more about what you can do with MyChart, go to [email or web address].

## 2023-12-28 DIAGNOSIS — H43823 Vitreomacular adhesion, bilateral: Secondary | ICD-10-CM | POA: Diagnosis not present

## 2023-12-28 DIAGNOSIS — H35033 Hypertensive retinopathy, bilateral: Secondary | ICD-10-CM | POA: Diagnosis not present

## 2023-12-28 DIAGNOSIS — E113313 Type 2 diabetes mellitus with moderate nonproliferative diabetic retinopathy with macular edema, bilateral: Secondary | ICD-10-CM | POA: Diagnosis not present

## 2023-12-28 DIAGNOSIS — H3582 Retinal ischemia: Secondary | ICD-10-CM | POA: Diagnosis not present

## 2024-01-09 DIAGNOSIS — L609 Nail disorder, unspecified: Secondary | ICD-10-CM | POA: Diagnosis not present

## 2024-01-09 DIAGNOSIS — E1140 Type 2 diabetes mellitus with diabetic neuropathy, unspecified: Secondary | ICD-10-CM | POA: Diagnosis not present

## 2024-01-10 DIAGNOSIS — E7800 Pure hypercholesterolemia, unspecified: Secondary | ICD-10-CM | POA: Diagnosis not present

## 2024-01-10 DIAGNOSIS — I10 Essential (primary) hypertension: Secondary | ICD-10-CM | POA: Diagnosis not present

## 2024-01-10 DIAGNOSIS — E1169 Type 2 diabetes mellitus with other specified complication: Secondary | ICD-10-CM | POA: Diagnosis not present

## 2024-01-10 DIAGNOSIS — I482 Chronic atrial fibrillation, unspecified: Secondary | ICD-10-CM | POA: Diagnosis not present

## 2024-01-11 DIAGNOSIS — E559 Vitamin D deficiency, unspecified: Secondary | ICD-10-CM | POA: Diagnosis not present

## 2024-01-11 DIAGNOSIS — I10 Essential (primary) hypertension: Secondary | ICD-10-CM | POA: Diagnosis not present

## 2024-01-11 DIAGNOSIS — E1165 Type 2 diabetes mellitus with hyperglycemia: Secondary | ICD-10-CM | POA: Diagnosis not present

## 2024-01-11 DIAGNOSIS — Z1329 Encounter for screening for other suspected endocrine disorder: Secondary | ICD-10-CM | POA: Diagnosis not present

## 2024-01-11 DIAGNOSIS — Z125 Encounter for screening for malignant neoplasm of prostate: Secondary | ICD-10-CM | POA: Diagnosis not present

## 2024-01-11 DIAGNOSIS — Z13220 Encounter for screening for lipoid disorders: Secondary | ICD-10-CM | POA: Diagnosis not present

## 2024-01-11 DIAGNOSIS — Z0001 Encounter for general adult medical examination with abnormal findings: Secondary | ICD-10-CM | POA: Diagnosis not present

## 2024-01-19 DIAGNOSIS — I10 Essential (primary) hypertension: Secondary | ICD-10-CM | POA: Diagnosis not present

## 2024-01-19 DIAGNOSIS — I482 Chronic atrial fibrillation, unspecified: Secondary | ICD-10-CM | POA: Diagnosis not present

## 2024-01-19 DIAGNOSIS — E1169 Type 2 diabetes mellitus with other specified complication: Secondary | ICD-10-CM | POA: Diagnosis not present

## 2024-01-19 DIAGNOSIS — Z6826 Body mass index (BMI) 26.0-26.9, adult: Secondary | ICD-10-CM | POA: Diagnosis not present

## 2024-01-19 DIAGNOSIS — I509 Heart failure, unspecified: Secondary | ICD-10-CM | POA: Diagnosis not present

## 2024-01-22 ENCOUNTER — Encounter: Payer: Self-pay | Admitting: Nurse Practitioner

## 2024-01-29 ENCOUNTER — Ambulatory Visit: Payer: Medicare Other | Admitting: Nurse Practitioner

## 2024-02-02 DIAGNOSIS — Z6825 Body mass index (BMI) 25.0-25.9, adult: Secondary | ICD-10-CM | POA: Diagnosis not present

## 2024-02-02 DIAGNOSIS — I10 Essential (primary) hypertension: Secondary | ICD-10-CM | POA: Diagnosis not present

## 2024-02-02 DIAGNOSIS — E1169 Type 2 diabetes mellitus with other specified complication: Secondary | ICD-10-CM | POA: Diagnosis not present

## 2024-02-02 DIAGNOSIS — I509 Heart failure, unspecified: Secondary | ICD-10-CM | POA: Diagnosis not present

## 2024-02-02 DIAGNOSIS — I482 Chronic atrial fibrillation, unspecified: Secondary | ICD-10-CM | POA: Diagnosis not present

## 2024-02-08 ENCOUNTER — Ambulatory Visit: Attending: Nurse Practitioner | Admitting: Nurse Practitioner

## 2024-02-08 ENCOUNTER — Encounter: Payer: Self-pay | Admitting: Nurse Practitioner

## 2024-02-08 VITALS — BP 138/60 | HR 62 | Ht 69.0 in | Wt 170.0 lb

## 2024-02-08 DIAGNOSIS — I739 Peripheral vascular disease, unspecified: Secondary | ICD-10-CM | POA: Diagnosis not present

## 2024-02-08 DIAGNOSIS — I4892 Unspecified atrial flutter: Secondary | ICD-10-CM | POA: Diagnosis not present

## 2024-02-08 DIAGNOSIS — N1831 Chronic kidney disease, stage 3a: Secondary | ICD-10-CM | POA: Diagnosis not present

## 2024-02-08 DIAGNOSIS — I2510 Atherosclerotic heart disease of native coronary artery without angina pectoris: Secondary | ICD-10-CM | POA: Insufficient documentation

## 2024-02-08 DIAGNOSIS — I5032 Chronic diastolic (congestive) heart failure: Secondary | ICD-10-CM | POA: Diagnosis not present

## 2024-02-08 DIAGNOSIS — E785 Hyperlipidemia, unspecified: Secondary | ICD-10-CM | POA: Insufficient documentation

## 2024-02-08 DIAGNOSIS — I471 Supraventricular tachycardia, unspecified: Secondary | ICD-10-CM | POA: Insufficient documentation

## 2024-02-08 DIAGNOSIS — I480 Paroxysmal atrial fibrillation: Secondary | ICD-10-CM | POA: Diagnosis not present

## 2024-02-08 NOTE — Progress Notes (Signed)
 Cardiology Office Note:  .   Date:  02/08/2024 ID:  [name], DOB [birth year], MRN [id number] PCP: Lauran Pollard, MD  [facility] Providers Cardiologist:  Eilleen Grates, MD PV Cardiologist:  Antionette Kirks, MD {  History of Present Illness: .   [name] is a very pleasant 73 y.o. male with a PMH of CAD, s/p PCI in 2001, s/p CABG x 5 (LIMA - diagnonal, sequential SVG - RI and OM1, sequential SVT - PDA and PK in 2003), HFmrEF, PAF/A-flutter/SVT, HTN, HLD, PAD, T2DM, CKD, GERD, and OSA. Closely has been followed by Dr. Lavonne Prairie and Dr. Alvenia Aus.   Admitted 2022 for syncope. Cardiac cath revealed 3/5 patent grafts. Cardiac monitor revealed A-fib with HR with rates as low as 30's while sleeping predominantly.   Admitted 11/2022 with volume overload. TTE EF 50-55%, wall motion abnormality. Did have intermittent A-fib overall rate controlled, was resumed on Elqius. 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 Renal artery duplex showed 1-59% left RAS, patent left renal artery, right side had abnormal right resistive index but no RAS.   Recently presented to [facility] for SOB, found to be A-fib in 110's. Given Metoprolol , converted to NSR. BNP 589, given IV Lasix . EKG was read as possible ectopic Atach, though suspect was A-flutter 2:1, 120 bpm. Due to trop elevation, was transferred to [facility]. TTE EF 45-50%. Underwent cardiac cath that found severe native CAD with 3/5 grafts patent - see report below, no acute lesions found. Was started on Entresto  for ICM.   12/20/2023 - Saw Dr. Lavonne Prairie. Was overall doing well.   02/08/2024 - Today he presents for follow-up. Doing well. Denies any chest pain, shortness of breath, palpitations, syncope, presyncope, dizziness, orthopnea, PND, swelling or significant weight changes, acute bleeding, or claudication.  Studies Reviewed: [name]    EKG: EKG is not ordered today.   Echo 06/2023: LVEF 55-60%, no RWMA,  moderately elevated PASP, mild MR, borderline dilatation of aortic root, measuring 39 mm. Borderline dilation of ascending aorta, measuring 37 mm. IVC dilated, RAP 8 mmHg.   LHC 02/2023:    --------------NATIVE VESSELS--------------   Prox RCA to Mid RCA lesion is 60% stenosed. Mid RCA to Dist RCA lesion is 90% stenosed. Dist RCA lesion is 100% stenosed with 100% stenosed side branch in RPAV. 2nd RPL lesion is 100% stenosed. RPDA lesion is 100% stenosed. - STABLE   Prox LAD to Mid LAD is 100% stenosed at proximal edge of prior stenT / SP1 - STABLE   Previously grafted ramus lesion is 100% stenosed. STABLE   2nd Mrg SUPERIOR branch is 80% stenosed. (Prieviously grafted)   --------------GRAFTS-------------------   Seq SVG- -RI-OM1 graft was visualized by angiography.  The graft exhibits severe diffuse disease. Origin to Prox Graft lesion is 100% stenosed.   Seq SVG- RPDA-RPL graft was visualized by angiography and is normal in caliber.  Prox Graft lesion before RPDA is 40% stenosed.   LIMA-LAD(Diag) graft was visualized by angiography and is normal in caliber, fills Diag branch only.  Mid LAD to Dist LAD lesion is 100% stenosed.- beyond LIMA-LAD insertion   ------------HEMODYNAMICS------------------   LV end diastolic pressure is mildly elevated.   There is no aortic valve stenosis.   -----------------RECOMMENDATIONS--------------------   Recommend to resume Apixaban , at currently prescribed dose and frequency on 03/07/2023.   Recommend concurrent antiplatelet therapy of Aspirin  81 mg daily for LONG-TERM.   Severe Native CAD with 3/5 grafts patent (except LIMA  only fills a Diag branch & not the distal LAD that is atretic & occluded). Native Vessels LAD 100% CTO af 1st Sept (prox edge of stent) RI 100 % CTO, superior branch of OM1 99%, native Cx - mild diffuse disease Diffuse mid-distal RCA ~70-80% irregular diseae before 100% CTO in distal vessel prior to bifurcation.  Grafts: Known occlusion of  SeqSVG-RI-OM1 Patent LIMA-LAD/Diag with atretic/100% LAD Patent Seq SVG-RPDA-RPL1 (wiht very small RPDA & moderate RPL & no retrograde flow to bifurcation LVEDP 18 mmHg  No acute lesion found     RECOMMENDATIONS Continue GDMT for ICM/CHF with A-fib.  Suspected troponin elevation is related to demand ischemia. Restart Eliquis  in the morning, will start heparin  8 hours after Mynx closure.   Results communicated to Dr. Maximo Spar  Echo 02/2023: 1. Global mild LV hypokinesis with focal severe apical hypokinesis. Left  ventricular ejection fraction, by estimation, is 45 to 50%. The left  ventricle has mildly decreased function. The left ventricle demonstrates  global hypokinesis. The left  ventricular internal cavity size was moderately dilated. Left ventricular  diastolic function could not be evaluated.   2. Right ventricular systolic function is mildly reduced. The right  ventricular size is moderately enlarged. There is mildly elevated  pulmonary artery systolic pressure.   3. Left atrial size was mildly dilated.   4. Right atrial size was mildly dilated.   5. The mitral valve is normal in structure. Trivial mitral valve  regurgitation. No evidence of mitral stenosis.   6. The aortic valve is grossly normal. There is mild calcification of the  aortic valve. There is mild thickening of the aortic valve. Aortic valve  regurgitation is not visualized. No aortic stenosis is present.   7. The inferior vena cava is normal in size with <50% respiratory  variability, suggesting right atrial pressure of 8 mmHg.   Comparison(s): No significant change from prior study.  Vascular US  renal artery bilateral 11/2022:  Right: No evidence of right renal artery stenosis. Abnormal right         Resistive Index. Normal size right kidney.  Left:  1-59% stenosis of the left renal artery. Abnormal left         Resisitve Index. Normal size of left kidney. Patent LRA with         mildly elevated velocities  without significant stenosis.  Abdominal aortogram: 1.  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 2.  No significant aortoiliac disease. 3.  Right lower extremity: Calcified vessels with mild nonobstructive disease affecting the SFA and popliteal arteries, two-vessel runoff below the knee via the posterior tibial and peroneal artery.  Anterior tibial artery occluded distally and the dorsalis pedis fills retrograde from the pedal arch.   Recommendations: The patient has inline flow to the right toes via the posterior tibial artery.  No indication for revascularization.  Continue medical therapy. Resume Eliquis  tomorrow as long as no bleeding issues. 50 mL of contrast was used for the procedure.  The patient did develop shortness of breath and hypoxia with IV fluid hydration and was given 1 dose of IV furosemide  before the procedure.  He can be discharged home as long as he is not requiring any supplemental oxygen. Will plan on obtaining renal artery duplex as an outpatient to evaluate left kidney size and flow to make decision regarding left renal artery revascularization  Cardiac monitor 02/2021:  Patch Wear Time: 13 days and 23 hours (2022-05-13T12:17:15-0400 to 2022-05-27T11:22:43-0400) Atrial Fibrillation occurred continuously (100%  burden), ranging from 28-123 bpm (avg of 60 bpm). 3 Pauses occurred, the longest lasting 4.7 secs (13 bpm). Isolated VEs were rare (<1.0%, 8592), VE Couplets were rare (<1.0%, 115), and VE Triplets were rare (<1.0%, 1). MD notification criteria for Slow Atrial Fibrillation met - Notified Melinda P LPN on 1/61/0960 at 9:20 AM EDT (KT)   Atrial fib and flutter slow ventricular rate  Frequent ventricular pauses with the longest pause being 4.7 seconds Rare ventricular ectopy with one triplet  Right/Left heart cath 01/2021:  Ost LAD to Prox LAD lesion is 100% stenosed. Ramus lesion is 100% stenosed. 2nd Mrg lesion is 80% stenosed. Prox RCA to  Mid RCA lesion is 60% stenosed. Mid RCA to Dist RCA lesion is 90% stenosed. Origin to Mid Graft lesion before Ramus is 100% stenosed. RPDA lesion is 100% stenosed. 2nd RPL lesion is 100% stenosed.  Physical Exam:   VS:  BP 138/60   Pulse 62   Ht 5\' 9"  (1.753 m)   Wt 170 lb (77.1 kg)   SpO2 97%   BMI 25.10 kg/m    Wt Readings from Last 3 Encounters:  02/08/24 170 lb (77.1 kg)  12/20/23 173 lb (78.5 kg)  10/26/23 180 lb 6.4 oz (81.8 kg)    GEN: Well nourished, well developed in no acute distress NECK: No JVD; No carotid bruits CARDIAC: S1/S2, RRR, no murmurs, rubs, gallops RESPIRATORY:  Clear to auscultation without rales, wheezing or rhonchi  ABDOMEN: Soft, non-tender, non-distended EXTREMITIES:  No edema; No deformity   ASSESSMENT AND PLAN: .    CAD, s/p CABG, hx of NSTEMI LHC 02/2023 revealed no acute lesions noted on cath, recommended medical management. Was felt that troponin elevation was d/t demand ischemia. Denies any chest pain. Continue current medication regimen. Heart healthy diet and regular cardiovascular exercise encouraged.     2. PAF/A-flutter, SVT Known hx of Aflutter/A-fib. Denies any recent tachycardia or palpitations. HR well controlled today. Continue Lopressor  12.5 mg BID. Continue Eliquis  5 mg BID for CHA2DS2-VASc score of 5. He is on appropriate dosing, denies any bleeding issues.   3. HFrecEF, Ischemic Cardiomyopathy Stage C, NYHA class I symptoms. TTE 06/2023 revealed EF recovered to 55-60%. Euvolemic and well compensated on exam. Continue current medication regimen.. Low sodium diet, fluid restriction <2L, and daily weights encouraged. Educated to contact our office for weight gain of 2 lbs overnight or 5 lbs in one week.   4. PAD, HLD Denies any claudication symptoms. LDL 88 2025. Continue Crestor  10 mg daily. Heart healthy diet and regular cardiovascular exercise encouraged. Continue to follow-up with Dr. Alvenia Aus.   5. CKD stage 3a Most recent kidney  function showed eGFR > 60. Avoid nephrotoxic agents. No medication changes at this time. Continue to follow with PCP.  Dispo: Follow-up with me or APP in 6 months or sooner if anything changes.   Signed, Lasalle Pointer, NP

## 2024-02-08 NOTE — Patient Instructions (Signed)

## 2024-02-09 DIAGNOSIS — E1169 Type 2 diabetes mellitus with other specified complication: Secondary | ICD-10-CM | POA: Diagnosis not present

## 2024-02-09 DIAGNOSIS — I10 Essential (primary) hypertension: Secondary | ICD-10-CM | POA: Diagnosis not present

## 2024-02-09 DIAGNOSIS — E7800 Pure hypercholesterolemia, unspecified: Secondary | ICD-10-CM | POA: Diagnosis not present

## 2024-02-09 DIAGNOSIS — I482 Chronic atrial fibrillation, unspecified: Secondary | ICD-10-CM | POA: Diagnosis not present

## 2024-02-15 DIAGNOSIS — E113313 Type 2 diabetes mellitus with moderate nonproliferative diabetic retinopathy with macular edema, bilateral: Secondary | ICD-10-CM | POA: Diagnosis not present

## 2024-02-23 ENCOUNTER — Ambulatory Visit: Payer: Medicare Other | Admitting: Nurse Practitioner

## 2024-02-24 ENCOUNTER — Other Ambulatory Visit: Payer: Self-pay | Admitting: Cardiovascular Disease

## 2024-02-26 NOTE — Telephone Encounter (Signed)
 Prescription refill request for Eliquis  received. Indication:afib Last office visit:5/25 Scr:1.22  5/25 Age:73  Weight:77.1  kg  Prescription refilled

## 2024-02-27 ENCOUNTER — Ambulatory Visit (INDEPENDENT_AMBULATORY_CARE_PROVIDER_SITE_OTHER): Admitting: Nurse Practitioner

## 2024-02-27 ENCOUNTER — Encounter: Payer: Self-pay | Admitting: Nurse Practitioner

## 2024-02-27 VITALS — BP 122/70 | HR 59 | Ht 69.0 in | Wt 176.4 lb

## 2024-02-27 DIAGNOSIS — Z7984 Long term (current) use of oral hypoglycemic drugs: Secondary | ICD-10-CM

## 2024-02-27 DIAGNOSIS — E1165 Type 2 diabetes mellitus with hyperglycemia: Secondary | ICD-10-CM

## 2024-02-27 DIAGNOSIS — E782 Mixed hyperlipidemia: Secondary | ICD-10-CM | POA: Diagnosis not present

## 2024-02-27 DIAGNOSIS — Z7985 Long-term (current) use of injectable non-insulin antidiabetic drugs: Secondary | ICD-10-CM | POA: Diagnosis not present

## 2024-02-27 MED ORDER — METFORMIN HCL ER 500 MG PO TB24: 1000.0000 mg | ORAL_TABLET | Freq: Every day | ORAL | Status: DC

## 2024-02-27 NOTE — Progress Notes (Signed)
 Endocrinology Follow Up Note       02/27/2024, 9:52 AM   Subjective:    Patient ID: [name], male    DOB: Nov 25, 1950.  [name] is being seen in follow up after being seen in consultation for management of currently uncontrolled symptomatic diabetes requested by  Lauran Pollard, MD.   Past Medical History:  Diagnosis Date   A-fib [facility])    CAD (coronary artery disease)    Hypertension    Obstructive sleep apnea    CPAP   Rosacea    Type 2 diabetes mellitus with hyperglycemia [facility])     Past Surgical History:  Procedure Laterality Date   ABDOMINAL AORTOGRAM W/LOWER EXTREMITY Right 11/30/2022   Procedure: ABDOMINAL AORTOGRAM W/LOWER EXTREMITY;  Surgeon: Wenona Hamilton, MD;  Location: [facility] INVASIVE CV LAB;  Service: Cardiovascular;  Laterality: Right;   CORONARY ARTERY BYPASS GRAFT     LIMA to the LAD, sequential SVG to ramus intermediate and OM1, sequential SVG to PDA and posterior lateral   LEFT HEART CATH AND CORS/GRAFTS ANGIOGRAPHY N/A 03/06/2023   Procedure: LEFT HEART CATH AND CORS/GRAFTS ANGIOGRAPHY;  Surgeon: Arleen Lacer, MD;  Location: [facility] INVASIVE CV LAB;  Service: Cardiovascular;  Laterality: N/A;   RIGHT/LEFT HEART CATH AND CORONARY/GRAFT ANGIOGRAPHY N/A 01/21/2021   Procedure: RIGHT/LEFT HEART CATH AND CORONARY/GRAFT ANGIOGRAPHY;  Surgeon: Avanell Leigh, MD;  Location: [facility] INVASIVE CV LAB;  Service: Cardiovascular;  Laterality: N/A;    Social History   Socioeconomic History   Marital status: Married    Spouse name: Production designer, theatre/television/film   Number of children: 3   Years of education: Not on file   Highest education level: Not on file  Occupational History   Not on file  Tobacco Use   Smoking status: Never   Smokeless tobacco: Current    Types: Snuff   Tobacco comments:    Uses Snus   Vaping Use   Vaping status: Never Used  Substance and Sexual Activity   Alcohol  use: Yes     Comment: 1-2 beers a day   Drug use: Never   Sexual activity: Not on file  Other Topics Concern   Not on file  Social History Narrative   Lives with wife and two grandchildren.     Lives in a one story home    Right Handed    Drinks little caffeine    Social Drivers of Health   Financial Resource Strain: Medium Risk (06/21/2023)   Overall Financial Resource Strain (CARDIA)    Difficulty of Paying Living Expenses: Somewhat hard  Food Insecurity: No Food Insecurity (03/03/2023)   Hunger Vital Sign    Worried About Running Out of Food in the Last Year: Never true    Ran Out of Food in the Last Year: Never true  Transportation Needs: No Transportation Needs (06/21/2023)   PRAPARE - Administrator, Civil Service (Medical): No    Lack of Transportation (Non-Medical): No  Physical Activity: Inactive (03/20/2023)   Exercise Vital Sign    Days of Exercise per Week: 0 days    Minutes of Exercise per Session: 0 min  Stress: Not on file  Social Connections: Not on file    Family History  Problem Relation Age of Onset   Heart disease Mother 7   Diabetes Mother    Breast cancer Mother    Heart attack Mother    Brain cancer Mother    Heart disease Father    Kidney cancer Father        died from cancer   Heart attack Father 90   Cancer - Other Father        Spinal Cancer   Migraines Sister    Fibromyalgia Sister    Heart Problems Sister        Heart problems after birth of child   CAD Brother 50       CABG   Diabetes Brother     Outpatient Encounter Medications as of 02/27/2024  Medication Sig   amLODipine  (NORVASC ) 10 MG tablet Take 10 mg by mouth daily.   dapagliflozin  propanediol (FARXIGA ) 10 MG TABS tablet Take 1 tablet (10 mg total) by mouth daily.   Dulaglutide  (TRULICITY ) 4.5 MG/0.5ML SOAJ Inject 4.5 mg into the skin once a week.   ELIQUIS  5 MG TABS tablet TAKE 1 TABLET BY MOUTH TWICE A DAY   ergocalciferol (VITAMIN D2) 1.25 MG (50000 UT) capsule Take 50,000  Units by mouth once a week.   furosemide  (LASIX ) 20 MG tablet TAKE 1 TABLET BY MOUTH DAILY AS NEEDED (WEIGHT GAIN OF 3-5LB OR INCREASED SHORTNESS OF BREATH).   metoprolol  tartrate (LOPRESSOR ) 25 MG tablet Take 0.5 tablets (12.5 mg total) by mouth 2 (two) times daily.   nitroGLYCERIN  (NITROSTAT ) 0.4 MG SL tablet Place 1 tablet (0.4 mg total) under the tongue every 5 (five) minutes x 3 doses as needed for chest pain.   rosuvastatin  (CRESTOR ) 10 MG tablet Take 1 tablet (10 mg total) by mouth daily.   sacubitril -valsartan  (ENTRESTO ) 49-51 MG Take 1 tablet by mouth 2 (two) times daily.   [DISCONTINUED] metFORMIN  (GLUCOPHAGE -XR) 500 MG 24 hr tablet Take 1 tablet (500 mg total) by mouth daily with breakfast.   metFORMIN  (GLUCOPHAGE -XR) 500 MG 24 hr tablet Take 2 tablets (1,000 mg total) by mouth daily with breakfast.   No facility-administered encounter medications on file as of 02/27/2024.    ALLERGIES: No Known Allergies  VACCINATION STATUS: Immunization History  Administered Date(s) Administered   Td (Adult),5 Lf Tetanus Toxid, Preservative Free 07/19/2010    Diabetes He presents for his follow-up diabetic visit. He has type 2 diabetes mellitus. Onset time: diagnosed at approx age of 3. His disease course has been worsening. There are no hypoglycemic associated symptoms. Associated symptoms include foot ulcerations. Pertinent negatives for diabetes include no blurred vision. There are no hypoglycemic complications. Symptoms are stable. Diabetic complications include heart disease (MI when he was 49), nephropathy, peripheral neuropathy, PVD and retinopathy. Risk factors for coronary artery disease include diabetes mellitus, dyslipidemia, family history, hypertension, male sex, obesity and sedentary lifestyle. Current diabetic treatment includes oral agent (dual therapy) (and Trulicity ). He is compliant with treatment most of the time. His weight is increasing steadily. He is following a generally  healthy diet. When asked about meal planning, he reported none. He has not had a previous visit with a dietitian. He rarely participates in exercise. His home blood glucose trend is fluctuating minimally. His overall blood glucose range is 180-200 mg/dl. (He presents today, accompanied by his wife, with his CGM showing slightly above target glycemic profile.  His POCT A1c today is 8.2%, increasing from last visit of 7%.  Analysis of his CGM shows TIR 39%, TAR 61%, TBR 0% with a GMI of 7.9%.  He notes his PCP mentioned starting him on insulin  at his recent visit with him but he wanted to check with us  first.  He admits his diet has not been the best recently, has been eating more sausage, bacon or ham biscuits for breakfast and subsequently sees a spike in glucose as a result.) An ACE inhibitor/angiotensin II receptor blocker is not being taken. He sees a podiatrist.Eye exam is current.     Review of systems  Constitutional: + increasing body weight, current Body mass index is 26.05 kg/m., no fatigue, no subjective hyperthermia, no subjective hypothermia Eyes: + blurry vision (hx retinopathy), no xerophthalmia ENT: no sore throat, no nodules palpated in throat, no dysphagia/odynophagia, no hoarseness Cardiovascular: no chest pain, no shortness of breath, no palpitations, no leg swelling Respiratory: no cough, no shortness of breath Gastrointestinal: no nausea/vomiting/diarrhea Musculoskeletal: no muscle/joint aches, walks with cane for frequent falls Skin: no rashes, no hyperemia, + chronic DM foot ulcer treated by [facility] Neurological: + chronic hand tremors with decreased dexterity, no numbness, no tingling, no dizziness Psychiatric: no depression, no anxiety  Objective:     BP 122/70 (BP Location: Left Arm, Patient Position: Sitting, Cuff Size: Large)   Pulse (!) 59   Ht 5' 9 (1.753 m)   Wt 176 lb 6.4 oz (80 kg)   BMI 26.05 kg/m   Wt Readings from Last 3 Encounters:   02/27/24 176 lb 6.4 oz (80 kg)  02/08/24 170 lb (77.1 kg)  12/20/23 173 lb (78.5 kg)     BP Readings from Last 3 Encounters:  02/27/24 122/70  02/08/24 138/60  12/20/23 (!) 146/60     Physical Exam- Limited  Constitutional:  Body mass index is 26.05 kg/m. , not in acute distress, normal state of mind Eyes:  EOMI, no exophthalmos Musculoskeletal: walks with cane Skin:  no rashes, no hyperemia    Diabetic Foot Exam - Simple   No data filed     CMP ( most recent) CMP     Component Value Date/Time   NA 134 (L) 03/06/2023 1318   NA 138 12/13/2022 0943   K 3.6 03/06/2023 1318   CL 104 03/06/2023 1318   CO2 20 (L) 03/06/2023 1318   GLUCOSE 109 (H) 03/06/2023 1318   BUN 22 03/06/2023 1318   BUN 14 12/13/2022 0943   CREATININE 1.12 03/06/2023 1318   CALCIUM  8.8 (L) 03/06/2023 1318   PROT 7.2 01/19/2021 2031   ALBUMIN 3.7 01/19/2021 2031   AST 17 01/19/2021 2031   ALT 19 01/19/2021 2031   ALKPHOS 91 01/19/2021 2031   BILITOT 1.2 01/19/2021 2031   GFRNONAA >60 03/06/2023 1318   GFRAA  07/24/2007 1130    >60        The eGFR has been calculated using the MDRD equation. This calculation has not been validated in all clinical     Diabetic Labs (most recent): Lab Results  Component Value Date   HGBA1C 7.0 (A) 10/26/2023   HGBA1C 7.1 (A) 05/22/2023   HGBA1C 7.5 (H) 03/04/2023   MICROALBUR 30 mg/L 05/22/2023     Lipid Panel ( most recent) Lipid Panel     Component Value Date/Time   CHOL 138 12/01/2022 0446   TRIG 74 12/01/2022 0446   HDL 39 (L) 12/01/2022 0446   CHOLHDL 3.5 12/01/2022 0446   VLDL 15 12/01/2022 0446   LDLCALC 84  12/01/2022 0446      Lab Results  Component Value Date   TSH 2.490 12/01/2022           Assessment & Plan:   1) Type 2 diabetes mellitus with hyperglycemia, without long-term current use of insulin  (HCC)  He presents today, accompanied by his wife, with his CGM showing slightly above target glycemic profile.  His POCT  A1c today is 8.2%, increasing from last visit of 7%.  Analysis of his CGM shows TIR 39%, TAR 61%, TBR 0% with a GMI of 7.9%.  He notes his PCP mentioned starting him on insulin  at his recent visit with him but he wanted to check with us  first.  He admits his diet has not been the best recently, has been eating more sausage, bacon or ham biscuits for breakfast and subsequently sees a spike in glucose as a result.  - [name] has currently uncontrolled symptomatic type 2 DM since 73 years of age.   -Recent labs reviewed.  - I had a long discussion with him about the progressive nature of diabetes and the pathology behind its complications. -his diabetes is complicated by CAD, CHF, PAD, CKD stage 3a, DM foot ulcers and he remains at a high risk for more acute and chronic complications which include CAD, CVA, CKD, retinopathy, and neuropathy. These are all discussed in detail with him.  The following Lifestyle Medicine recommendations according to American College of Lifestyle Medicine [facility]) were discussed and offered to patient and he agrees to start the journey:  A. Whole Foods, Plant-based plate comprising of fruits and vegetables, plant-based proteins, whole-grain carbohydrates was discussed in detail with the patient.   A list for source of those nutrients were also provided to the patient.  Patient will use only water or unsweetened tea for hydration. B.  The need to stay away from risky substances including alcohol , smoking; obtaining 7 to 9 hours of restorative sleep, at least 150 minutes of moderate intensity exercise weekly, the importance of healthy social connections,  and stress reduction techniques were discussed. C.  A full color page of  Calorie density of various food groups per pound showing examples of each food groups was provided to the patient.  - Nutritional counseling repeated at each appointment due to patients tendency to fall back in to old habits.  - The patient admits  there is a room for improvement in their diet and drink choices. -  Suggestion is made for the patient to avoid simple carbohydrates from their diet including Cakes, Sweet Desserts / Pastries, Ice Cream, Soda (diet and regular), Sweet Tea, Candies, Chips, Cookies, Sweet Pastries, Store Bought Juices, Alcohol  in Excess of 1-2 drinks a day, Artificial Sweeteners, Coffee Creamer, and Sugar-free Products. This will help patient to have stable blood glucose profile and potentially avoid unintended weight gain.   - I encouraged the patient to switch to unprocessed or minimally processed complex starch and increased protein intake (animal or plant source), fruits, and vegetables.   - Patient is advised to stick to a routine mealtimes to eat 3 meals a day and avoid unnecessary snacks (to snack only to correct hypoglycemia).  - I have approached him with the following individualized plan to manage his diabetes and patient agrees:   -He is advised to continue Farxiga  10 mg po daily, Trulicity  4.5 mg SQ weekly, and will increase Metformin  to 1000 mg ER daily with breakfast.   -he is encouraged to start monitoring glucose 2 times daily (  using his CGM), before breakfast and before bed, and to call the clinic if he has readings less than 70 or above 300 for 3 tests in a row. He was approved for Dexcom G7.  - Adjustment parameters are given to him for hypo and hyperglycemia in writing. - he is encouraged to call clinic for blood glucose levels less than 70 or above 300 mg /dl.  - Specific targets for  A1c; LDL, HDL, and Triglycerides were discussed with the patient.  2) Blood Pressure /Hypertension:  his blood pressure is controlled to target.   he is advised to continue his current medications including Norvasc  10 mg p.o. daily with breakfast and Lasix  20 mg po daily as needed for fluid.  3) Lipids/Hyperlipidemia:    Review of his recent lipid panel from 12/01/22 showed controlled LDL at 84 .  he is advised  to continue Crestor  10 mg daily at bedtime (takes this 4 nights a week due to myalgias).  Side effects and precautions discussed with him.   4)  Weight/Diet:  his Body mass index is 26.05 kg/m.  -   he is NOT a candidate for weight loss.  Exercise, and detailed carbohydrates information provided  -  detailed on discharge instructions.  5) Chronic Care/Health Maintenance: -he is not on ACEI/ARB and is on Statin medications and is encouraged to initiate and continue to follow up with Ophthalmology, Dentist, Podiatrist at least yearly or according to recommendations, and advised to stay away from smoking. I have recommended yearly flu vaccine and pneumonia vaccine at least every 5 years; moderate intensity exercise for up to 150 minutes weekly; and sleep for at least 7 hours a day.  - he is advised to maintain close follow up with Lauran Pollard, MD for primary care needs, as well as his other providers for optimal and coordinated care.    I spent  41  minutes in the care of the patient today including review of labs from CMP, Lipids, Thyroid  Function, Hematology (current and previous including abstractions from other facilities); face-to-face time discussing  his blood glucose readings/logs, discussing hypoglycemia and hyperglycemia episodes and symptoms, medications doses, his options of short and long term treatment based on the latest standards of care / guidelines;  discussion about incorporating lifestyle medicine;  and documenting the encounter. Risk reduction counseling performed per USPSTF guidelines to reduce obesity and cardiovascular risk factors.     Please refer to Patient Instructions for Blood Glucose Monitoring and Insulin /Medications Dosing Guide  in media tab for additional information. Please  also refer to  Patient Self Inventory in the Media  tab for reviewed elements of pertinent patient history.  [name] participated in the discussions, expressed understanding,  and voiced agreement with the above plans.  All questions were answered to his satisfaction. he is encouraged to contact clinic should he have any questions or concerns prior to his return visit.     Follow up plan: - Return in about 3 months (around 05/29/2024) for Diabetes F/U with A1c in office, No previsit labs.   Hulon Magic, [facility] [address] [place], [place] [address] Phone: [phone] Fax: [phone]  02/27/2024, 9:52 AM

## 2024-03-01 DIAGNOSIS — I10 Essential (primary) hypertension: Secondary | ICD-10-CM | POA: Diagnosis not present

## 2024-03-01 DIAGNOSIS — Z13220 Encounter for screening for lipoid disorders: Secondary | ICD-10-CM | POA: Diagnosis not present

## 2024-03-01 LAB — LAB REPORT - SCANNED: EGFR: 57.7

## 2024-03-05 DIAGNOSIS — E1169 Type 2 diabetes mellitus with other specified complication: Secondary | ICD-10-CM | POA: Diagnosis not present

## 2024-03-05 DIAGNOSIS — I509 Heart failure, unspecified: Secondary | ICD-10-CM | POA: Diagnosis not present

## 2024-03-05 DIAGNOSIS — I482 Chronic atrial fibrillation, unspecified: Secondary | ICD-10-CM | POA: Diagnosis not present

## 2024-03-05 DIAGNOSIS — Z6825 Body mass index (BMI) 25.0-25.9, adult: Secondary | ICD-10-CM | POA: Diagnosis not present

## 2024-03-05 DIAGNOSIS — I10 Essential (primary) hypertension: Secondary | ICD-10-CM | POA: Diagnosis not present

## 2024-03-09 ENCOUNTER — Ambulatory Visit: Payer: Self-pay | Admitting: Nurse Practitioner

## 2024-03-11 DIAGNOSIS — E7800 Pure hypercholesterolemia, unspecified: Secondary | ICD-10-CM | POA: Diagnosis not present

## 2024-03-11 DIAGNOSIS — I10 Essential (primary) hypertension: Secondary | ICD-10-CM | POA: Diagnosis not present

## 2024-03-11 DIAGNOSIS — E1169 Type 2 diabetes mellitus with other specified complication: Secondary | ICD-10-CM | POA: Diagnosis not present

## 2024-03-11 DIAGNOSIS — I482 Chronic atrial fibrillation, unspecified: Secondary | ICD-10-CM | POA: Diagnosis not present

## 2024-03-13 ENCOUNTER — Other Ambulatory Visit: Payer: Self-pay

## 2024-03-13 MED ORDER — METOPROLOL TARTRATE 25 MG PO TABS: 12.5000 mg | ORAL_TABLET | Freq: Two times a day (BID) | ORAL | 3 refills | Status: AC

## 2024-03-19 DIAGNOSIS — L609 Nail disorder, unspecified: Secondary | ICD-10-CM | POA: Diagnosis not present

## 2024-03-19 DIAGNOSIS — E1140 Type 2 diabetes mellitus with diabetic neuropathy, unspecified: Secondary | ICD-10-CM | POA: Diagnosis not present

## 2024-04-08 DIAGNOSIS — H35033 Hypertensive retinopathy, bilateral: Secondary | ICD-10-CM | POA: Diagnosis not present

## 2024-04-08 DIAGNOSIS — H3582 Retinal ischemia: Secondary | ICD-10-CM | POA: Diagnosis not present

## 2024-04-08 DIAGNOSIS — E113313 Type 2 diabetes mellitus with moderate nonproliferative diabetic retinopathy with macular edema, bilateral: Secondary | ICD-10-CM | POA: Diagnosis not present

## 2024-04-08 DIAGNOSIS — H43823 Vitreomacular adhesion, bilateral: Secondary | ICD-10-CM | POA: Diagnosis not present

## 2024-04-11 DIAGNOSIS — I482 Chronic atrial fibrillation, unspecified: Secondary | ICD-10-CM | POA: Diagnosis not present

## 2024-04-11 DIAGNOSIS — E1169 Type 2 diabetes mellitus with other specified complication: Secondary | ICD-10-CM | POA: Diagnosis not present

## 2024-04-11 DIAGNOSIS — I10 Essential (primary) hypertension: Secondary | ICD-10-CM | POA: Diagnosis not present

## 2024-04-11 DIAGNOSIS — E7800 Pure hypercholesterolemia, unspecified: Secondary | ICD-10-CM | POA: Diagnosis not present

## 2024-05-09 DIAGNOSIS — E1165 Type 2 diabetes mellitus with hyperglycemia: Secondary | ICD-10-CM | POA: Diagnosis not present

## 2024-05-09 DIAGNOSIS — Z0001 Encounter for general adult medical examination with abnormal findings: Secondary | ICD-10-CM | POA: Diagnosis not present

## 2024-05-09 LAB — LAB REPORT - SCANNED
A1c: 7.8
EGFR: 72.6

## 2024-05-10 DIAGNOSIS — E1169 Type 2 diabetes mellitus with other specified complication: Secondary | ICD-10-CM | POA: Diagnosis not present

## 2024-05-10 DIAGNOSIS — I482 Chronic atrial fibrillation, unspecified: Secondary | ICD-10-CM | POA: Diagnosis not present

## 2024-05-10 DIAGNOSIS — I10 Essential (primary) hypertension: Secondary | ICD-10-CM | POA: Diagnosis not present

## 2024-05-10 DIAGNOSIS — E7800 Pure hypercholesterolemia, unspecified: Secondary | ICD-10-CM | POA: Diagnosis not present

## 2024-05-16 DIAGNOSIS — E7800 Pure hypercholesterolemia, unspecified: Secondary | ICD-10-CM | POA: Diagnosis not present

## 2024-05-16 DIAGNOSIS — I482 Chronic atrial fibrillation, unspecified: Secondary | ICD-10-CM | POA: Diagnosis not present

## 2024-05-16 DIAGNOSIS — Z6825 Body mass index (BMI) 25.0-25.9, adult: Secondary | ICD-10-CM | POA: Diagnosis not present

## 2024-05-16 DIAGNOSIS — I10 Essential (primary) hypertension: Secondary | ICD-10-CM | POA: Diagnosis not present

## 2024-05-16 DIAGNOSIS — E7801 Familial hypercholesterolemia: Secondary | ICD-10-CM | POA: Diagnosis not present

## 2024-05-16 DIAGNOSIS — E1169 Type 2 diabetes mellitus with other specified complication: Secondary | ICD-10-CM | POA: Diagnosis not present

## 2024-05-16 LAB — HEMOGLOBIN A1C: Hemoglobin A1C: 7.8

## 2024-05-20 DIAGNOSIS — H43823 Vitreomacular adhesion, bilateral: Secondary | ICD-10-CM | POA: Diagnosis not present

## 2024-05-20 DIAGNOSIS — H35033 Hypertensive retinopathy, bilateral: Secondary | ICD-10-CM | POA: Diagnosis not present

## 2024-05-20 DIAGNOSIS — H3582 Retinal ischemia: Secondary | ICD-10-CM | POA: Diagnosis not present

## 2024-05-20 DIAGNOSIS — E113313 Type 2 diabetes mellitus with moderate nonproliferative diabetic retinopathy with macular edema, bilateral: Secondary | ICD-10-CM | POA: Diagnosis not present

## 2024-05-28 DIAGNOSIS — E1140 Type 2 diabetes mellitus with diabetic neuropathy, unspecified: Secondary | ICD-10-CM | POA: Diagnosis not present

## 2024-05-28 DIAGNOSIS — L609 Nail disorder, unspecified: Secondary | ICD-10-CM | POA: Diagnosis not present

## 2024-05-29 ENCOUNTER — Ambulatory Visit (INDEPENDENT_AMBULATORY_CARE_PROVIDER_SITE_OTHER): Admitting: Nurse Practitioner

## 2024-05-29 ENCOUNTER — Encounter: Payer: Self-pay | Admitting: Nurse Practitioner

## 2024-05-29 VITALS — BP 120/80 | HR 65 | Ht 69.0 in | Wt 171.6 lb

## 2024-05-29 DIAGNOSIS — E782 Mixed hyperlipidemia: Secondary | ICD-10-CM | POA: Diagnosis not present

## 2024-05-29 DIAGNOSIS — Z7984 Long term (current) use of oral hypoglycemic drugs: Secondary | ICD-10-CM

## 2024-05-29 DIAGNOSIS — E1165 Type 2 diabetes mellitus with hyperglycemia: Secondary | ICD-10-CM | POA: Diagnosis not present

## 2024-05-29 DIAGNOSIS — Z7985 Long-term (current) use of injectable non-insulin antidiabetic drugs: Secondary | ICD-10-CM

## 2024-05-29 MED ORDER — METFORMIN HCL ER 500 MG PO TB24: 1000.0000 mg | ORAL_TABLET | Freq: Every day | ORAL | Status: AC

## 2024-05-29 NOTE — Progress Notes (Signed)
 Endocrinology Follow Up Note       05/29/2024, 10:08 AM   Subjective:    Patient ID: [name], male    DOB: October 22, 1950.  [name] is being seen in follow up after being seen in consultation for management of currently uncontrolled symptomatic diabetes requested by  Trudy Vaughn FALCON, MD.   Past Medical History:  Diagnosis Date   A-fib [facility])    CAD (coronary artery disease)    Hypertension    Obstructive sleep apnea    CPAP   Rosacea    Type 2 diabetes mellitus with hyperglycemia [facility])     Past Surgical History:  Procedure Laterality Date   ABDOMINAL AORTOGRAM W/LOWER EXTREMITY Right 11/30/2022   Procedure: ABDOMINAL AORTOGRAM W/LOWER EXTREMITY;  Surgeon: Darron Deatrice LABOR, MD;  Location: [facility] INVASIVE CV LAB;  Service: Cardiovascular;  Laterality: Right;   CORONARY ARTERY BYPASS GRAFT     LIMA to the LAD, sequential SVG to ramus intermediate and OM1, sequential SVG to PDA and posterior lateral   LEFT HEART CATH AND CORS/GRAFTS ANGIOGRAPHY N/A 03/06/2023   Procedure: LEFT HEART CATH AND CORS/GRAFTS ANGIOGRAPHY;  Surgeon: Anner Alm ORN, MD;  Location: [facility] INVASIVE CV LAB;  Service: Cardiovascular;  Laterality: N/A;   RIGHT/LEFT HEART CATH AND CORONARY/GRAFT ANGIOGRAPHY N/A 01/21/2021   Procedure: RIGHT/LEFT HEART CATH AND CORONARY/GRAFT ANGIOGRAPHY;  Surgeon: Court Dorn PARAS, MD;  Location: [facility] INVASIVE CV LAB;  Service: Cardiovascular;  Laterality: N/A;    Social History   Socioeconomic History   Marital status: Married    Spouse name: Production designer, theatre/television/film   Number of children: 3   Years of education: Not on file   Highest education level: Not on file  Occupational History   Not on file  Tobacco Use   Smoking status: Never   Smokeless tobacco: Current    Types: Snuff   Tobacco comments:    Uses Snus   Vaping Use   Vaping status: Never Used  Substance and Sexual Activity   Alcohol  use: Yes     Comment: 1-2 beers a day   Drug use: Never   Sexual activity: Not on file  Other Topics Concern   Not on file  Social History Narrative   Lives with wife and two grandchildren.     Lives in a one story home    Right Handed    Drinks little caffeine    Social Drivers of Health   Financial Resource Strain: Medium Risk (06/21/2023)   Overall Financial Resource Strain (CARDIA)    Difficulty of Paying Living Expenses: Somewhat hard  Food Insecurity: No Food Insecurity (03/03/2023)   Hunger Vital Sign    Worried About Running Out of Food in the Last Year: Never true    Ran Out of Food in the Last Year: Never true  Transportation Needs: No Transportation Needs (06/21/2023)   PRAPARE - Administrator, Civil Service (Medical): No    Lack of Transportation (Non-Medical): No  Physical Activity: Inactive (03/20/2023)   Exercise Vital Sign    Days of Exercise per Week: 0 days    Minutes of Exercise per Session: 0 min  Stress: Not on file  Social Connections: Not on file    Family History  Problem Relation Age of Onset   Heart disease Mother 83   Diabetes Mother    Breast cancer Mother    Heart attack Mother    Brain cancer Mother    Heart disease Father    Kidney cancer Father        died from cancer   Heart attack Father 71   Cancer - Other Father        Spinal Cancer   Migraines Sister    Fibromyalgia Sister    Heart Problems Sister        Heart problems after birth of child   CAD Brother 96       CABG   Diabetes Brother     Outpatient Encounter Medications as of 05/29/2024  Medication Sig   amLODipine  (NORVASC ) 10 MG tablet Take 10 mg by mouth daily.   dapagliflozin  propanediol (FARXIGA ) 10 MG TABS tablet Take 1 tablet (10 mg total) by mouth daily.   Dulaglutide  (TRULICITY ) 4.5 MG/0.5ML SOAJ Inject 4.5 mg into the skin once a week.   ELIQUIS  5 MG TABS tablet TAKE 1 TABLET BY MOUTH TWICE A DAY   ergocalciferol (VITAMIN D2) 1.25 MG (50000 UT) capsule Take  50,000 Units by mouth once a week.   furosemide  (LASIX ) 20 MG tablet TAKE 1 TABLET BY MOUTH DAILY AS NEEDED (WEIGHT GAIN OF 3-5LB OR INCREASED SHORTNESS OF BREATH).   metoprolol  tartrate (LOPRESSOR ) 25 MG tablet Take 0.5 tablets (12.5 mg total) by mouth 2 (two) times daily.   nitroGLYCERIN  (NITROSTAT ) 0.4 MG SL tablet Place 1 tablet (0.4 mg total) under the tongue every 5 (five) minutes x 3 doses as needed for chest pain.   rosuvastatin  (CRESTOR ) 10 MG tablet Take 1 tablet (10 mg total) by mouth daily.   sacubitril -valsartan  (ENTRESTO ) 49-51 MG Take 1 tablet by mouth 2 (two) times daily.   [DISCONTINUED] metFORMIN  (GLUCOPHAGE -XR) 500 MG 24 hr tablet Take 2 tablets (1,000 mg total) by mouth daily with breakfast.   metFORMIN  (GLUCOPHAGE -XR) 500 MG 24 hr tablet Take 2 tablets (1,000 mg total) by mouth daily with breakfast.   No facility-administered encounter medications on file as of 05/29/2024.    ALLERGIES: No Known Allergies  VACCINATION STATUS: Immunization History  Administered Date(s) Administered   Td (Adult),5 Lf Tetanus Toxid, Preservative Free 07/19/2010    Diabetes He presents for his follow-up diabetic visit. He has type 2 diabetes mellitus. Onset time: diagnosed at approx age of 37. His disease course has been improving. There are no hypoglycemic associated symptoms. Associated symptoms include foot ulcerations. Pertinent negatives for diabetes include no blurred vision. There are no hypoglycemic complications. Symptoms are stable. Diabetic complications include heart disease (MI when he was 49), nephropathy, peripheral neuropathy, PVD and retinopathy. Risk factors for coronary artery disease include diabetes mellitus, dyslipidemia, family history, hypertension, male sex, obesity and sedentary lifestyle. Current diabetic treatment includes oral agent (dual therapy) (and Trulicity ). He is compliant with treatment most of the time. His weight is increasing steadily. He is following a  generally healthy diet. When asked about meal planning, he reported none. He has not had a previous visit with a dietitian. He rarely participates in exercise. His home blood glucose trend is decreasing steadily. His overall blood glucose range is 180-200 mg/dl. (He presents today, with his CGM showing slightly above target glycemic profile.  His most recent A1c on 8/28 was 7.8%, improving from last visit of 8.2%.  Analysis  of his CGM shows TIR 43%, TAR 57%, TBR 0% with a GMI of 8.1%.  His breakfast sugar readings are still spiking too high, I encouraged him to change to something else to prevent that.  ) An ACE inhibitor/angiotensin II receptor blocker is not being taken. He sees a podiatrist.Eye exam is current.     Review of systems  Constitutional: +decreasing body weight, current Body mass index is 25.34 kg/m., no fatigue, no subjective hyperthermia, no subjective hypothermia Eyes: + blurry vision (hx retinopathy), no xerophthalmia ENT: no sore throat, no nodules palpated in throat, no dysphagia/odynophagia, no hoarseness Cardiovascular: no chest pain, no shortness of breath, no palpitations, no leg swelling Respiratory: no cough, no shortness of breath Gastrointestinal: no nausea/vomiting/diarrhea Musculoskeletal: no muscle/joint aches, walks with cane for frequent falls-no using it today Skin: no rashes, no hyperemia Neurological: + chronic hand tremors with decreased dexterity, no numbness, no tingling, no dizziness Psychiatric: no depression, no anxiety  Objective:     BP 120/80 (BP Location: Left Arm, Patient Position: Sitting, Cuff Size: Large)   Pulse 65   Ht 5' 9 (1.753 m)   Wt 171 lb 9.6 oz (77.8 kg)   BMI 25.34 kg/m   Wt Readings from Last 3 Encounters:  05/29/24 171 lb 9.6 oz (77.8 kg)  02/27/24 176 lb 6.4 oz (80 kg)  02/08/24 170 lb (77.1 kg)     BP Readings from Last 3 Encounters:  05/29/24 120/80  02/27/24 122/70  02/08/24 138/60      Physical Exam-  Limited  Constitutional:  Body mass index is 25.34 kg/m. , not in acute distress, normal state of mind Eyes:  EOMI, no exophthalmos Musculoskeletal: no gross deformities, strength intact in all four extremities, no gross restriction of joint movements Skin:  no rashes, no hyperemia Neurological: no tremor with outstretched hands    Diabetic Foot Exam - Simple   Simple Foot Form Diabetic Foot exam was performed with the following findings: Yes 05/29/2024  9:53 AM  Visual Inspection See comments: Yes Sensation Testing Intact to touch and monofilament testing bilaterally: Yes Pulse Check Posterior Tibialis and Dorsalis pulse intact bilaterally: Yes Comments Missing second toe on right foot     CMP ( most recent) CMP     Component Value Date/Time   NA 134 (L) 03/06/2023 1318   NA 138 12/13/2022 0943   K 3.6 03/06/2023 1318   CL 104 03/06/2023 1318   CO2 20 (L) 03/06/2023 1318   GLUCOSE 109 (H) 03/06/2023 1318   BUN 22 03/06/2023 1318   BUN 14 12/13/2022 0943   CREATININE 1.12 03/06/2023 1318   CALCIUM  8.8 (L) 03/06/2023 1318   PROT 7.2 01/19/2021 2031   ALBUMIN 3.7 01/19/2021 2031   AST 17 01/19/2021 2031   ALT 19 01/19/2021 2031   ALKPHOS 91 01/19/2021 2031   BILITOT 1.2 01/19/2021 2031   GFRNONAA >60 03/06/2023 1318   GFRAA  07/24/2007 1130    >60        The eGFR has been calculated using the MDRD equation. This calculation has not been validated in all clinical     Diabetic Labs (most recent): Lab Results  Component Value Date   HGBA1C 7.80 05/16/2024   HGBA1C 7.0 (A) 10/26/2023   HGBA1C 7.1 (A) 05/22/2023   MICROALBUR 30 mg/L 05/22/2023     Lipid Panel ( most recent) Lipid Panel     Component Value Date/Time   CHOL 138 12/01/2022 0446   TRIG 74 12/01/2022 0446  HDL 39 (L) 12/01/2022 0446   CHOLHDL 3.5 12/01/2022 0446   VLDL 15 12/01/2022 0446   LDLCALC 84 12/01/2022 0446      Lab Results  Component Value Date   TSH 2.490 12/01/2022            Assessment & Plan:   1) Type 2 diabetes mellitus with hyperglycemia, without long-term current use of insulin  (HCC)  He presents today, with his CGM showing slightly above target glycemic profile.  His most recent A1c on 8/28 was 7.8%, improving from last visit of 8.2%.  Analysis of his CGM shows TIR 43%, TAR 57%, TBR 0% with a GMI of 8.1%.  His breakfast sugar readings are still spiking too high, I encouraged him to change to something else to prevent that.    - [name] has currently uncontrolled symptomatic type 2 DM since 73 years of age.   -Recent labs reviewed.  - I had a long discussion with him about the progressive nature of diabetes and the pathology behind its complications. -his diabetes is complicated by CAD, CHF, PAD, CKD stage 3a, DM foot ulcers and he remains at a high risk for more acute and chronic complications which include CAD, CVA, CKD, retinopathy, and neuropathy. These are all discussed in detail with him.  The following Lifestyle Medicine recommendations according to American College of Lifestyle Medicine [facility]) were discussed and offered to patient and he agrees to start the journey:  A. Whole Foods, Plant-based plate comprising of fruits and vegetables, plant-based proteins, whole-grain carbohydrates was discussed in detail with the patient.   A list for source of those nutrients were also provided to the patient.  Patient will use only water or unsweetened tea for hydration. B.  The need to stay away from risky substances including alcohol , smoking; obtaining 7 to 9 hours of restorative sleep, at least 150 minutes of moderate intensity exercise weekly, the importance of healthy social connections,  and stress reduction techniques were discussed. C.  A full color page of  Calorie density of various food groups per pound showing examples of each food groups was provided to the patient.  - Nutritional counseling repeated at each appointment due to  patients tendency to fall back in to old habits.  - The patient admits there is a room for improvement in their diet and drink choices. -  Suggestion is made for the patient to avoid simple carbohydrates from their diet including Cakes, Sweet Desserts / Pastries, Ice Cream, Soda (diet and regular), Sweet Tea, Candies, Chips, Cookies, Sweet Pastries, Store Bought Juices, Alcohol  in Excess of 1-2 drinks a day, Artificial Sweeteners, Coffee Creamer, and Sugar-free Products. This will help patient to have stable blood glucose profile and potentially avoid unintended weight gain.   - I encouraged the patient to switch to unprocessed or minimally processed complex starch and increased protein intake (animal or plant source), fruits, and vegetables.   - Patient is advised to stick to a routine mealtimes to eat 3 meals a day and avoid unnecessary snacks (to snack only to correct hypoglycemia).  - I have approached him with the following individualized plan to manage his diabetes and patient agrees:   -He is advised to continue Farxiga  10 mg po daily, Trulicity  4.5 mg SQ weekly, and Metformin  to 1000 mg ER daily with breakfast.   We talked about diet strategies to improve glucose so we can avoid insulin .  -he is encouraged to start monitoring glucose 2 times daily (using his  CGM), before breakfast and before bed, and to call the clinic if he has readings less than 70 or above 300 for 3 tests in a row. He was approved for Dexcom G7.  - Adjustment parameters are given to him for hypo and hyperglycemia in writing. - he is encouraged to call clinic for blood glucose levels less than 70 or above 300 mg /dl.  - Specific targets for  A1c; LDL, HDL, and Triglycerides were discussed with the patient.  2) Blood Pressure /Hypertension:  his blood pressure is controlled to target.   he is advised to continue his current medications as prescribed by PCP.  3) Lipids/Hyperlipidemia:    Review of his recent lipid  panel from 03/01/24 showed controlled LDL at 64 .  he is advised to continue Crestor  10 mg daily at bedtime (takes this 4 nights a week due to myalgias).  Side effects and precautions discussed with him.   4)  Weight/Diet:  his Body mass index is 25.34 kg/m.  -   he is NOT a candidate for weight loss.  Exercise, and detailed carbohydrates information provided  -  detailed on discharge instructions.  5) Chronic Care/Health Maintenance: -he is not on ACEI/ARB and is on Statin medications and is encouraged to initiate and continue to follow up with Ophthalmology, Dentist, Podiatrist at least yearly or according to recommendations, and advised to stay away from smoking. I have recommended yearly flu vaccine and pneumonia vaccine at least every 5 years; moderate intensity exercise for up to 150 minutes weekly; and sleep for at least 7 hours a day.  - he is advised to maintain close follow up with Trudy Vaughn FALCON, MD for primary care needs, as well as his other providers for optimal and coordinated care.     I spent  36  minutes in the care of the patient today including review of labs from CMP, Lipids, Thyroid  Function, Hematology (current and previous including abstractions from other facilities); face-to-face time discussing  his blood glucose readings/logs, discussing hypoglycemia and hyperglycemia episodes and symptoms, medications doses, his options of short and long term treatment based on the latest standards of care / guidelines;  discussion about incorporating lifestyle medicine;  and documenting the encounter. Risk reduction counseling performed per USPSTF guidelines to reduce obesity and cardiovascular risk factors.     Please refer to Patient Instructions for Blood Glucose Monitoring and Insulin /Medications Dosing Guide  in media tab for additional information. Please  also refer to  Patient Self Inventory in the Media  tab for reviewed elements of pertinent patient history.  [name] participated in the discussions, expressed understanding, and voiced agreement with the above plans.  All questions were answered to his satisfaction. he is encouraged to contact clinic should he have any questions or concerns prior to his return visit.     Follow up plan: - Return in about 4 months (around 09/28/2024) for Diabetes F/U with A1c in office, No previsit labs, Bring meter and logs.   Benton Rio, [facility] [address] [place], [place] [address] Phone: [phone] Fax: [phone]  05/29/2024, 10:08 AM

## 2024-06-11 DIAGNOSIS — I482 Chronic atrial fibrillation, unspecified: Secondary | ICD-10-CM | POA: Diagnosis not present

## 2024-06-11 DIAGNOSIS — E7800 Pure hypercholesterolemia, unspecified: Secondary | ICD-10-CM | POA: Diagnosis not present

## 2024-06-11 DIAGNOSIS — E1169 Type 2 diabetes mellitus with other specified complication: Secondary | ICD-10-CM | POA: Diagnosis not present

## 2024-06-11 DIAGNOSIS — I10 Essential (primary) hypertension: Secondary | ICD-10-CM | POA: Diagnosis not present

## 2024-06-16 DIAGNOSIS — E118 Type 2 diabetes mellitus with unspecified complications: Secondary | ICD-10-CM | POA: Insufficient documentation

## 2024-06-16 NOTE — Progress Notes (Unsigned)
 Cardiology Office Note:   Date:  06/19/2024  ID:  [name], DOB 1951-01-29, MRN [id number] PCP: Trudy Vaughn FALCON, MD  [facility] Providers Cardiologist:  Lynwood Schilling, MD PV Cardiologist:  Deatrice Cage, MD {  History of Present Illness:   [name] is a 73 y.o. male who for follow up of CAD.   He was hospitalized for syncope in 2022.  He had a cath as below.  3 out of 5 grafts were patent.    The sequential vein to the ramus branch and obtuse marginal branch was occluded at its origin.  Sequential vein to the PDA and PLA was widely patent as was the LIMA to the LAD.  He was managed medically.  Cath demonstrated an EF of 50%.  There was apical hypokinesis and mild LVH.  He had moderately elevated pulmonary pressures.  There was mild MR.   He wore a monitor and the atrial fib has a slow heart rate in the 30s.   He was admitted 11/2022 with volume overload. TTE EF 50-55%, wall motion abnormality. Did have intermittent A-fib overall rate controlled, was resumed on Elqius. 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 Renal artery duplex showed 1-59% left RAS, patent left renal artery, right side had abnormal right resistive index but no RAS.  In 2024 he was at [facility] with SOB and atrial fib.  Due to trop elevation, was transferred to [facility]. TTE EF 45-50%. Underwent cardiac cath that found severe native CAD with 3/5 grafts patent - see report below, no acute lesions found. Was started on Entresto  for ICM.    Since I last saw him he has had no new cardiac problems.  The patient denies any new symptoms such as chest discomfort, neck or arm discomfort. There has been no new shortness of breath, PND or orthopnea. There have been no reported palpitations, presyncope or syncope.   He is not active because of neuropathy and vertigo.  He uses a cane.  He does go 50 yards to his mailbox or his out building.  He gets tired in his legs but he is  not having any new cardiovascular complaints.   ROS: As stated in the HPI and negative for all other systems.  Studies Reviewed:    EKG:     NA    Risk Assessment/Calculations:              Physical Exam:   VS:  BP 120/72   Pulse 60   Ht 5' 9 (1.753 m)   Wt 171 lb (77.6 kg)   BMI 25.25 kg/m    Wt Readings from Last 3 Encounters:  06/19/24 171 lb (77.6 kg)  05/29/24 171 lb 9.6 oz (77.8 kg)  02/27/24 176 lb 6.4 oz (80 kg)     GEN: Well nourished, well developed in no acute distress NECK: No JVD; No carotid bruits CARDIAC: RRR, 2 out of 6 brief apical systolic murmur radiating slightly at the aortic outflow tract, no diastolic murmurs, rubs, gallops RESPIRATORY:  Clear to auscultation without rales, wheezing or rhonchi  ABDOMEN: Soft, non-tender, non-distended EXTREMITIES:  No edema; No deformity   ASSESSMENT AND PLAN:   CAD:   The patient has no new sypmtoms since his last catheterization 2024.  No further cardiovascular testing is indicated.  We will continue with aggressive risk reduction and meds as listed.  ATRIAL FIB:   He tolerates anticoagulation.  CHA2DS2-VASc is 5.  He tolerates Eliquis .  No change in therapy.   HTN: The blood pressure is at target.  No change in therapy.  DYSLIPIDEMIA: Total cholesterol was apparently at target per his primary provider.  He is actually taking his Crestor  3 times a week as he did not tolerate taking it daily but again he is at target which I would suggest would be an LDL in the 50s.  I do not have these results.    DM: A1c was elevated but he recently had his metformin  adjusted by his endocrinologist.  PULMONARY HTN:   I will follow this clinically.  This has been mild.   PVD: He will continue with risk reduction.   Follow up with me in 1 year.  Signed, Lynwood Schilling, MD

## 2024-06-19 ENCOUNTER — Ambulatory Visit (INDEPENDENT_AMBULATORY_CARE_PROVIDER_SITE_OTHER): Admitting: Cardiology

## 2024-06-19 ENCOUNTER — Encounter: Payer: Self-pay | Admitting: Cardiology

## 2024-06-19 VITALS — BP 120/72 | HR 60 | Ht 69.0 in | Wt 171.0 lb

## 2024-06-19 DIAGNOSIS — I10 Essential (primary) hypertension: Secondary | ICD-10-CM | POA: Diagnosis not present

## 2024-06-19 DIAGNOSIS — E785 Hyperlipidemia, unspecified: Secondary | ICD-10-CM

## 2024-06-19 DIAGNOSIS — I739 Peripheral vascular disease, unspecified: Secondary | ICD-10-CM

## 2024-06-19 DIAGNOSIS — E118 Type 2 diabetes mellitus with unspecified complications: Secondary | ICD-10-CM

## 2024-06-19 DIAGNOSIS — I2510 Atherosclerotic heart disease of native coronary artery without angina pectoris: Secondary | ICD-10-CM

## 2024-06-19 DIAGNOSIS — I272 Pulmonary hypertension, unspecified: Secondary | ICD-10-CM

## 2024-06-19 DIAGNOSIS — I480 Paroxysmal atrial fibrillation: Secondary | ICD-10-CM

## 2024-06-19 NOTE — Patient Instructions (Addendum)

## 2024-06-24 DIAGNOSIS — E1169 Type 2 diabetes mellitus with other specified complication: Secondary | ICD-10-CM | POA: Diagnosis not present

## 2024-06-24 DIAGNOSIS — Z23 Encounter for immunization: Secondary | ICD-10-CM | POA: Diagnosis not present

## 2024-06-24 DIAGNOSIS — E7800 Pure hypercholesterolemia, unspecified: Secondary | ICD-10-CM | POA: Diagnosis not present

## 2024-06-24 DIAGNOSIS — I482 Chronic atrial fibrillation, unspecified: Secondary | ICD-10-CM | POA: Diagnosis not present

## 2024-06-24 DIAGNOSIS — Z6825 Body mass index (BMI) 25.0-25.9, adult: Secondary | ICD-10-CM | POA: Diagnosis not present

## 2024-06-24 DIAGNOSIS — I10 Essential (primary) hypertension: Secondary | ICD-10-CM | POA: Diagnosis not present

## 2024-07-01 DIAGNOSIS — E113313 Type 2 diabetes mellitus with moderate nonproliferative diabetic retinopathy with macular edema, bilateral: Secondary | ICD-10-CM | POA: Diagnosis not present

## 2024-07-12 DIAGNOSIS — E7800 Pure hypercholesterolemia, unspecified: Secondary | ICD-10-CM | POA: Diagnosis not present

## 2024-07-12 DIAGNOSIS — I10 Essential (primary) hypertension: Secondary | ICD-10-CM | POA: Diagnosis not present

## 2024-07-12 DIAGNOSIS — I482 Chronic atrial fibrillation, unspecified: Secondary | ICD-10-CM | POA: Diagnosis not present

## 2024-07-12 DIAGNOSIS — E1169 Type 2 diabetes mellitus with other specified complication: Secondary | ICD-10-CM | POA: Diagnosis not present

## 2024-08-09 DIAGNOSIS — E7800 Pure hypercholesterolemia, unspecified: Secondary | ICD-10-CM | POA: Diagnosis not present

## 2024-08-09 DIAGNOSIS — I10 Essential (primary) hypertension: Secondary | ICD-10-CM | POA: Diagnosis not present

## 2024-08-09 DIAGNOSIS — I482 Chronic atrial fibrillation, unspecified: Secondary | ICD-10-CM | POA: Diagnosis not present

## 2024-08-09 DIAGNOSIS — E1169 Type 2 diabetes mellitus with other specified complication: Secondary | ICD-10-CM | POA: Diagnosis not present

## 2024-08-13 DIAGNOSIS — L609 Nail disorder, unspecified: Secondary | ICD-10-CM | POA: Diagnosis not present

## 2024-08-13 DIAGNOSIS — E1140 Type 2 diabetes mellitus with diabetic neuropathy, unspecified: Secondary | ICD-10-CM | POA: Diagnosis not present

## 2024-08-22 DIAGNOSIS — H35033 Hypertensive retinopathy, bilateral: Secondary | ICD-10-CM | POA: Diagnosis not present

## 2024-08-22 DIAGNOSIS — H43823 Vitreomacular adhesion, bilateral: Secondary | ICD-10-CM | POA: Diagnosis not present

## 2024-08-22 DIAGNOSIS — H3582 Retinal ischemia: Secondary | ICD-10-CM | POA: Diagnosis not present

## 2024-08-22 DIAGNOSIS — E113313 Type 2 diabetes mellitus with moderate nonproliferative diabetic retinopathy with macular edema, bilateral: Secondary | ICD-10-CM | POA: Diagnosis not present

## 2024-08-23 DIAGNOSIS — Z13220 Encounter for screening for lipoid disorders: Secondary | ICD-10-CM | POA: Diagnosis not present

## 2024-08-23 DIAGNOSIS — E1165 Type 2 diabetes mellitus with hyperglycemia: Secondary | ICD-10-CM | POA: Diagnosis not present

## 2024-08-23 LAB — LAB REPORT - SCANNED
A1c: 7
EGFR: 64.5

## 2024-08-26 ENCOUNTER — Other Ambulatory Visit: Payer: Self-pay

## 2024-08-26 MED ORDER — APIXABAN 5 MG PO TABS: 5.0000 mg | ORAL_TABLET | Freq: Two times a day (BID) | ORAL | 5 refills | Status: AC

## 2024-08-26 NOTE — Telephone Encounter (Signed)
 Prescription refill request for Eliquis  received. Indication:afib Last office visit:10/25 Scr: 1.08  8/25 Age:73 Weight:77.6  kg  Prescription refilled

## 2024-09-30 ENCOUNTER — Encounter: Payer: Self-pay | Admitting: Nurse Practitioner

## 2024-09-30 ENCOUNTER — Ambulatory Visit (INDEPENDENT_AMBULATORY_CARE_PROVIDER_SITE_OTHER): Admitting: Nurse Practitioner

## 2024-09-30 VITALS — BP 118/68 | HR 57 | Ht 69.0 in | Wt 173.4 lb

## 2024-09-30 DIAGNOSIS — Z7984 Long term (current) use of oral hypoglycemic drugs: Secondary | ICD-10-CM | POA: Diagnosis not present

## 2024-09-30 DIAGNOSIS — E1165 Type 2 diabetes mellitus with hyperglycemia: Secondary | ICD-10-CM | POA: Diagnosis not present

## 2024-09-30 DIAGNOSIS — E782 Mixed hyperlipidemia: Secondary | ICD-10-CM | POA: Diagnosis not present

## 2024-09-30 DIAGNOSIS — Z7985 Long-term (current) use of injectable non-insulin antidiabetic drugs: Secondary | ICD-10-CM

## 2024-09-30 NOTE — Progress Notes (Signed)
 "                                                                        Endocrinology Follow Up Note       09/30/2024, 10:28 AM   Subjective:    Patient ID: [name], male    DOB: 24-Oct-1950.  [name] is being seen in follow up after being seen in consultation for management of currently uncontrolled symptomatic diabetes requested by  Trudy Vaughn FALCON, MD.   Past Medical History:  Diagnosis Date   A-fib [facility])    CAD (coronary artery disease)    Hypertension    Obstructive sleep apnea    CPAP   Rosacea    Type 2 diabetes mellitus with hyperglycemia [facility])     Past Surgical History:  Procedure Laterality Date   ABDOMINAL AORTOGRAM W/LOWER EXTREMITY Right 11/30/2022   Procedure: ABDOMINAL AORTOGRAM W/LOWER EXTREMITY;  Surgeon: Darron Deatrice LABOR, MD;  Location: [facility] INVASIVE CV LAB;  Service: Cardiovascular;  Laterality: Right;   CORONARY ARTERY BYPASS GRAFT     LIMA to the LAD, sequential SVG to ramus intermediate and OM1, sequential SVG to PDA and posterior lateral   LEFT HEART CATH AND CORS/GRAFTS ANGIOGRAPHY N/A 03/06/2023   Procedure: LEFT HEART CATH AND CORS/GRAFTS ANGIOGRAPHY;  Surgeon: Anner Alm ORN, MD;  Location: [facility] INVASIVE CV LAB;  Service: Cardiovascular;  Laterality: N/A;   RIGHT/LEFT HEART CATH AND CORONARY/GRAFT ANGIOGRAPHY N/A 01/21/2021   Procedure: RIGHT/LEFT HEART CATH AND CORONARY/GRAFT ANGIOGRAPHY;  Surgeon: Court Dorn PARAS, MD;  Location: [facility] INVASIVE CV LAB;  Service: Cardiovascular;  Laterality: N/A;    Social History   Socioeconomic History   Marital status: Married    Spouse name: Production Designer, Theatre/television/film   Number of children: 3   Years of education: Not on file   Highest education level: Not on file  Occupational History   Not on file  Tobacco Use   Smoking status: Never   Smokeless tobacco: Current    Types: Snuff   Tobacco comments:    Uses Snus   Vaping Use   Vaping status: Never Used  Substance and Sexual Activity   Alcohol  use: Yes     Comment: 1-2 beers a day   Drug use: Never   Sexual activity: Not on file  Other Topics Concern   Not on file  Social History Narrative   Lives with wife and two grandchildren.     Lives in a one story home    Right Handed    Drinks little caffeine    Social Drivers of Health   Tobacco Use: High Risk (09/30/2024)   Patient History    Smoking Tobacco Use: Never    Smokeless Tobacco Use: Current    Passive Exposure: Not on file  Financial Resource Strain: Medium Risk (06/21/2023)   Overall Financial Resource Strain (CARDIA)    Difficulty of Paying Living Expenses: Somewhat hard  Food Insecurity: No Food Insecurity (03/03/2023)   Hunger Vital Sign    Worried About Running Out of Food in the Last Year: Never true    Ran Out of Food in the Last Year: Never true  Transportation Needs: No Transportation Needs (06/21/2023)   PRAPARE - Transportation  Lack of Transportation (Medical): No    Lack of Transportation (Non-Medical): No  Physical Activity: Inactive (03/20/2023)   Exercise Vital Sign    Days of Exercise per Week: 0 days    Minutes of Exercise per Session: 0 min  Stress: Not on file  Social Connections: Not on file  Depression (EYV7-0): Not on file  Alcohol  Screen: Not on file  Housing: Low Risk (03/03/2023)   Housing    Last Housing Risk Score: 0  Utilities: Not At Risk (03/03/2023)   AHC Utilities    Threatened with loss of utilities: No  Health Literacy: Adequate Health Literacy (06/21/2023)   B1300 Health Literacy    Frequency of need for help with medical instructions: Never    Family History  Problem Relation Age of Onset   Heart disease Mother 71   Diabetes Mother    Breast cancer Mother    Heart attack Mother    Brain cancer Mother    Heart disease Father    Kidney cancer Father        died from cancer   Heart attack Father 31   Cancer - Other Father        Spinal Cancer   Migraines Sister    Fibromyalgia Sister    Heart Problems Sister         Heart problems after birth of child   CAD Brother 28       CABG   Diabetes Brother     Outpatient Encounter Medications as of 09/30/2024  Medication Sig   amLODipine  (NORVASC ) 10 MG tablet Take 10 mg by mouth daily.   apixaban  (ELIQUIS ) 5 MG TABS tablet Take 1 tablet (5 mg total) by mouth 2 (two) times daily.   dapagliflozin  propanediol (FARXIGA ) 10 MG TABS tablet Take 1 tablet (10 mg total) by mouth daily.   Dulaglutide  (TRULICITY ) 4.5 MG/0.5ML SOAJ Inject 4.5 mg into the skin once a week.   ergocalciferol (VITAMIN D2) 1.25 MG (50000 UT) capsule Take 50,000 Units by mouth once a week.   furosemide  (LASIX ) 20 MG tablet TAKE 1 TABLET BY MOUTH DAILY AS NEEDED (WEIGHT GAIN OF 3-5LB OR INCREASED SHORTNESS OF BREATH).   metFORMIN  (GLUCOPHAGE -XR) 500 MG 24 hr tablet Take 2 tablets (1,000 mg total) by mouth daily with breakfast.   metoprolol  tartrate (LOPRESSOR ) 25 MG tablet Take 0.5 tablets (12.5 mg total) by mouth 2 (two) times daily.   nitroGLYCERIN  (NITROSTAT ) 0.4 MG SL tablet Place 1 tablet (0.4 mg total) under the tongue every 5 (five) minutes x 3 doses as needed for chest pain.   rosuvastatin  (CRESTOR ) 10 MG tablet Take 1 tablet (10 mg total) by mouth daily.   sacubitril -valsartan  (ENTRESTO ) 49-51 MG Take 1 tablet by mouth 2 (two) times daily.   No facility-administered encounter medications on file as of 09/30/2024.    ALLERGIES: No Known Allergies  VACCINATION STATUS: Immunization History  Administered Date(s) Administered   Td (Adult),5 Lf Tetanus Toxid, Preservative Free 07/19/2010    Diabetes He presents for his follow-up diabetic visit. He has type 2 diabetes mellitus. Onset time: diagnosed at approx age of 74. His disease course has been improving. There are no hypoglycemic associated symptoms. Associated symptoms include foot ulcerations. Pertinent negatives for diabetes include no blurred vision. There are no hypoglycemic complications. Symptoms are stable. Diabetic  complications include heart disease (MI when he was 74), nephropathy, peripheral neuropathy, PVD and retinopathy. Risk factors for coronary artery disease include diabetes mellitus, dyslipidemia, family history, hypertension, male  sex, obesity and sedentary lifestyle. Current diabetic treatment includes oral agent (dual therapy) (and Trulicity ). He is compliant with treatment most of the time. His weight is increasing steadily. He is following a generally healthy diet. When asked about meal planning, he reported none. He has not had a previous visit with a dietitian. He rarely participates in exercise. His home blood glucose trend is decreasing steadily. His overall blood glucose range is 180-200 mg/dl. (He presents today, with his CGM showing slightly above target glycemic profile.  His most recent A1c on 12/11 was 7%, improving from last visit of 7.8%.  Analysis of his CGM shows TIR 68%, TAR 32%, TBR 0% with a GMI of 7.4%.  His breakfast sugar readings are still spiking too high, mainly after eating a frozen processed breakfast sandwich.) An ACE inhibitor/angiotensin II receptor blocker is not being taken. He sees a podiatrist.Eye exam is current.     Review of systems  Constitutional: +stable body weight, current Body mass index is 25.61 kg/m., no fatigue, no subjective hyperthermia, no subjective hypothermia Eyes: + blurry vision (hx retinopathy), no xerophthalmia ENT: no sore throat, no nodules palpated in throat, no dysphagia/odynophagia, no hoarseness Cardiovascular: no chest pain, no shortness of breath, no palpitations, no leg swelling Respiratory: no cough, no shortness of breath Gastrointestinal: no nausea/vomiting/diarrhea Musculoskeletal: no muscle/joint aches, walks with cane for frequent falls Skin: no rashes, no hyperemia Neurological: + chronic hand tremors with decreased dexterity, no numbness, no tingling, no dizziness Psychiatric: no depression, no anxiety  Objective:     BP  118/68 (BP Location: Left Arm, Patient Position: Sitting)   Pulse (!) 57   Ht 5' 9 (1.753 m)   Wt 173 lb 6.4 oz (78.7 kg)   BMI 25.61 kg/m   Wt Readings from Last 3 Encounters:  09/30/24 173 lb 6.4 oz (78.7 kg)  06/19/24 171 lb (77.6 kg)  05/29/24 171 lb 9.6 oz (77.8 kg)     BP Readings from Last 3 Encounters:  09/30/24 118/68  06/19/24 120/72  05/29/24 120/80       Physical Exam- Limited  Constitutional:  Body mass index is 25.61 kg/m. , not in acute distress, normal state of mind Eyes:  EOMI, no exophthalmos Cardiovascular: he does have bradycardia and abnormal rhythm- skipping a beat every 3-4 beats (I did encourage him to follow up with cardiology for this). Musculoskeletal: no gross deformities, strength intact in all four extremities, no gross restriction of joint movements, walks with cane Skin:  no rashes, no hyperemia Neurological: no tremor with outstretched hands    Diabetic Foot Exam - Simple   No data filed     CMP ( most recent) CMP     Component Value Date/Time   NA 134 (L) 03/06/2023 1318   NA 138 12/13/2022 0943   K 3.6 03/06/2023 1318   CL 104 03/06/2023 1318   CO2 20 (L) 03/06/2023 1318   GLUCOSE 109 (H) 03/06/2023 1318   BUN 22 03/06/2023 1318   BUN 14 12/13/2022 0943   CREATININE 1.12 03/06/2023 1318   CALCIUM  8.8 (L) 03/06/2023 1318   PROT 7.2 01/19/2021 2031   ALBUMIN 3.7 01/19/2021 2031   AST 17 01/19/2021 2031   ALT 19 01/19/2021 2031   ALKPHOS 91 01/19/2021 2031   BILITOT 1.2 01/19/2021 2031   GFRNONAA >60 03/06/2023 1318   GFRAA  07/24/2007 1130    >60        The eGFR has been calculated using the MDRD equation. This  calculation has not been validated in all clinical     Diabetic Labs (most recent): Lab Results  Component Value Date   HGBA1C 7.80 05/16/2024   HGBA1C 7.0 (A) 10/26/2023   HGBA1C 7.1 (A) 05/22/2023   MICROALBUR 30 mg/L 05/22/2023     Lipid Panel ( most recent) Lipid Panel     Component Value  Date/Time   CHOL 138 12/01/2022 0446   TRIG 74 12/01/2022 0446   HDL 39 (L) 12/01/2022 0446   CHOLHDL 3.5 12/01/2022 0446   VLDL 15 12/01/2022 0446   LDLCALC 84 12/01/2022 0446      Lab Results  Component Value Date   TSH 2.490 12/01/2022           Assessment & Plan:   1) Type 2 diabetes mellitus with hyperglycemia, without long-term current use of insulin  (HCC)  He presents today, with his CGM showing slightly above target glycemic profile.  His most recent A1c on 12/11 was 7%, improving from last visit of 7.8%.  Analysis of his CGM shows TIR 68%, TAR 32%, TBR 0% with a GMI of 7.4%.  His breakfast sugar readings are still spiking too high, mainly after eating a frozen processed breakfast sandwich.  - [name] has currently uncontrolled symptomatic type 2 DM since 74 years of age.   -Recent labs reviewed.  - I had a long discussion with him about the progressive nature of diabetes and the pathology behind its complications. -his diabetes is complicated by CAD, CHF, PAD, CKD stage 3a, DM foot ulcers and he remains at a high risk for more acute and chronic complications which include CAD, CVA, CKD, retinopathy, and neuropathy. These are all discussed in detail with him.  The following Lifestyle Medicine recommendations according to American College of Lifestyle Medicine [facility]) were discussed and offered to patient and he agrees to start the journey:  A. Whole Foods, Plant-based plate comprising of fruits and vegetables, plant-based proteins, whole-grain carbohydrates was discussed in detail with the patient.   A list for source of those nutrients were also provided to the patient.  Patient will use only water or unsweetened tea for hydration. B.  The need to stay away from risky substances including alcohol , smoking; obtaining 7 to 9 hours of restorative sleep, at least 150 minutes of moderate intensity exercise weekly, the importance of healthy social connections,  and stress  reduction techniques were discussed. C.  A full color page of  Calorie density of various food groups per pound showing examples of each food groups was provided to the patient.  - Nutritional counseling repeated/built upon at each appointment.  - The patient admits there is a room for improvement in their diet and drink choices. -  Suggestion is made for the patient to avoid simple carbohydrates from their diet including Cakes, Sweet Desserts / Pastries, Ice Cream, Soda (diet and regular), Sweet Tea, Candies, Chips, Cookies, Sweet Pastries, Store Bought Juices, Alcohol  in Excess of 1-2 drinks a day, Artificial Sweeteners, Coffee Creamer, and Sugar-free Products. This will help patient to have stable blood glucose profile and potentially avoid unintended weight gain.   - I encouraged the patient to switch to unprocessed or minimally processed complex starch and increased protein intake (animal or plant source), fruits, and vegetables.   - Patient is advised to stick to a routine mealtimes to eat 3 meals a day and avoid unnecessary snacks (to snack only to correct hypoglycemia).  - I have approached him with the following individualized  plan to manage his diabetes and patient agrees:   -He is advised to continue Farxiga  10 mg po daily, Trulicity  4.5 mg SQ weekly, and Metformin  to 1000 mg ER daily with breakfast due to stable glycemic profile.   We talked about diet strategies to improve morning glucose so we can avoid insulin .  He tends to eat more of a high fat processed breakfast.  -he is encouraged to start monitoring glucose 2 times daily (using his CGM), before breakfast and before bed, and to call the clinic if he has readings less than 70 or above 300 for 3 tests in a row. He was approved for Dexcom G7.  - Adjustment parameters are given to him for hypo and hyperglycemia in writing. - he is encouraged to call clinic for blood glucose levels less than 70 or above 300 mg /dl.  - Specific  targets for  A1c; LDL, HDL, and Triglycerides were discussed with the patient.  2) Blood Pressure /Hypertension:  his blood pressure is controlled to target.   he is advised to continue his current medications as prescribed by PCP.  3) Lipids/Hyperlipidemia:    Review of his recent lipid panel from 08/22/24 showed controlled LDL at 59 .  he is advised to continue Crestor  10 mg daily at bedtime (takes this 4 nights a week due to myalgias).  Side effects and precautions discussed with him.   4)  Weight/Diet:  his Body mass index is 25.61 kg/m.  -   he is NOT a candidate for weight loss.  Exercise, and detailed carbohydrates information provided  -  detailed on discharge instructions.  5) Chronic Care/Health Maintenance: -he is not on ACEI/ARB and is on Statin medications and is encouraged to initiate and continue to follow up with Ophthalmology, Dentist, Podiatrist at least yearly or according to recommendations, and advised to stay away from smoking. I have recommended yearly flu vaccine and pneumonia vaccine at least every 5 years; moderate intensity exercise for up to 150 minutes weekly; and sleep for at least 7 hours a day.  - he is advised to maintain close follow up with Trudy Vaughn FALCON, MD for primary care needs, as well as his other providers for optimal and coordinated care.     I spent  41  minutes in the care of the patient today including review of labs from CMP, Lipids, Thyroid  Function, Hematology (current and previous including abstractions from other facilities); face-to-face time discussing  his blood glucose readings/logs, discussing hypoglycemia and hyperglycemia episodes and symptoms, medications doses, his options of short and long term treatment based on the latest standards of care / guidelines;  discussion about incorporating lifestyle medicine;  and documenting the encounter. Risk reduction counseling performed per USPSTF guidelines to reduce obesity and cardiovascular  risk factors.     Please refer to Patient Instructions for Blood Glucose Monitoring and Insulin /Medications Dosing Guide  in media tab for additional information. Please  also refer to  Patient Self Inventory in the Media  tab for reviewed elements of pertinent patient history.  [name] participated in the discussions, expressed understanding, and voiced agreement with the above plans.  All questions were answered to his satisfaction. he is encouraged to contact clinic should he have any questions or concerns prior to his return visit.    Follow up plan: - Return in about 4 months (around 01/28/2025) for Diabetes F/U with A1c in office, No previsit labs, Bring meter and logs.   Thomas Rio, FNP-BC   [facility] [address] [place], [place] [address] Phone: [phone] Fax: [phone]  09/30/2024, 10:28 AM    "

## 2024-10-07 ENCOUNTER — Encounter: Payer: Self-pay | Admitting: Cardiology

## 2025-01-31 ENCOUNTER — Ambulatory Visit: Admitting: Nurse Practitioner
